# Patient Record
Sex: Female | Born: 1940 | Race: White | Hispanic: No | Smoking: Never smoker
Health system: Southern US, Community
[De-identification: ages and names within clinical notes are randomized; demographics above are authoritative.]

## PROBLEM LIST (undated history)

## (undated) DIAGNOSIS — R112 Nausea with vomiting, unspecified: Secondary | ICD-10-CM

## (undated) DIAGNOSIS — E785 Hyperlipidemia, unspecified: Secondary | ICD-10-CM

## (undated) DIAGNOSIS — K449 Diaphragmatic hernia without obstruction or gangrene: Secondary | ICD-10-CM

## (undated) DIAGNOSIS — I341 Nonrheumatic mitral (valve) prolapse: Secondary | ICD-10-CM

## (undated) DIAGNOSIS — R011 Cardiac murmur, unspecified: Secondary | ICD-10-CM

## (undated) DIAGNOSIS — K5792 Diverticulitis of intestine, part unspecified, without perforation or abscess without bleeding: Secondary | ICD-10-CM

## (undated) DIAGNOSIS — Z9889 Other specified postprocedural states: Secondary | ICD-10-CM

## (undated) DIAGNOSIS — C801 Malignant (primary) neoplasm, unspecified: Secondary | ICD-10-CM

## (undated) DIAGNOSIS — L03039 Cellulitis of unspecified toe: Secondary | ICD-10-CM

## (undated) HISTORY — PX: BREAST BIOPSY: SHX20

## (undated) HISTORY — DX: Diaphragmatic hernia without obstruction or gangrene: K44.9

## (undated) HISTORY — PX: EYE SURGERY: SHX253

## (undated) HISTORY — DX: Nonrheumatic mitral (valve) prolapse: I34.1

## (undated) HISTORY — DX: Diverticulitis of intestine, part unspecified, without perforation or abscess without bleeding: K57.92

## (undated) HISTORY — DX: Cardiac murmur, unspecified: R01.1

## (undated) HISTORY — DX: Cellulitis of unspecified toe: L03.039

## (undated) HISTORY — DX: Hyperlipidemia, unspecified: E78.5

---

## 2001-03-19 ENCOUNTER — Other Ambulatory Visit: Admission: RE | Admit: 2001-03-19 | Discharge: 2001-03-19 | Payer: Self-pay | Admitting: *Deleted

## 2003-04-16 ENCOUNTER — Other Ambulatory Visit: Admission: RE | Admit: 2003-04-16 | Discharge: 2003-04-16 | Payer: Self-pay | Admitting: Family Medicine

## 2003-05-06 HISTORY — PX: COLONOSCOPY: SHX174

## 2003-05-21 ENCOUNTER — Ambulatory Visit (HOSPITAL_COMMUNITY): Admission: RE | Admit: 2003-05-21 | Discharge: 2003-05-21 | Payer: Self-pay | Admitting: Gastroenterology

## 2004-06-16 ENCOUNTER — Other Ambulatory Visit: Admission: RE | Admit: 2004-06-16 | Discharge: 2004-06-16 | Payer: Self-pay | Admitting: Family Medicine

## 2010-12-21 ENCOUNTER — Other Ambulatory Visit: Payer: Self-pay | Admitting: Internal Medicine

## 2010-12-21 ENCOUNTER — Ambulatory Visit
Admission: RE | Admit: 2010-12-21 | Discharge: 2010-12-21 | Disposition: A | Discharge status: Home / Self Care | Payer: Medicare Other | Source: Ambulatory Visit | Attending: Internal Medicine | Admitting: Internal Medicine

## 2010-12-21 DIAGNOSIS — M7989 Other specified soft tissue disorders: Secondary | ICD-10-CM

## 2010-12-21 DIAGNOSIS — M79674 Pain in right toe(s): Secondary | ICD-10-CM

## 2011-01-13 ENCOUNTER — Emergency Department (HOSPITAL_COMMUNITY)
Admission: EM | Admit: 2011-01-13 | Discharge: 2011-01-13 | Disposition: A | Discharge status: Home / Self Care | Payer: Medicare Other | Attending: Emergency Medicine | Admitting: Emergency Medicine

## 2011-01-13 ENCOUNTER — Emergency Department (HOSPITAL_COMMUNITY): Payer: Medicare Other

## 2011-01-13 DIAGNOSIS — L03039 Cellulitis of unspecified toe: Secondary | ICD-10-CM | POA: Insufficient documentation

## 2011-01-13 DIAGNOSIS — M869 Osteomyelitis, unspecified: Secondary | ICD-10-CM | POA: Insufficient documentation

## 2011-01-13 DIAGNOSIS — M79609 Pain in unspecified limb: Secondary | ICD-10-CM | POA: Insufficient documentation

## 2011-01-13 DIAGNOSIS — L02619 Cutaneous abscess of unspecified foot: Secondary | ICD-10-CM | POA: Insufficient documentation

## 2011-01-13 DIAGNOSIS — M7989 Other specified soft tissue disorders: Secondary | ICD-10-CM | POA: Insufficient documentation

## 2011-01-13 LAB — BASIC METABOLIC PANEL
BUN: 16 mg/dL (ref 6–23)
Chloride: 102 mEq/L (ref 96–112)
Creatinine, Ser: 0.57 mg/dL (ref 0.50–1.10)
GFR calc Af Amer: >90 mL/min (ref >90)
GFR calc non Af Amer: >90 mL/min (ref >90)

## 2011-01-13 LAB — CBC
HCT: 36.1 % (ref 36.0–46.0)
MCHC: 32.7 g/dL (ref 30.0–36.0)
MCV: 91.4 fL (ref 78.0–100.0)
Platelets: 254 10*3/uL (ref 150–400)
RDW: 13.2 % (ref 11.5–15.5)
WBC: 6 10*3/uL (ref 4.0–10.5)

## 2011-01-13 LAB — DIFFERENTIAL
Basophils Relative: 1 % (ref 0–1)
Lymphs Abs: 1.7 10*3/uL (ref 0.7–4.0)
Monocytes Relative: 9 % (ref 3–12)
Neutro Abs: 3.5 10*3/uL (ref 1.7–7.7)
Neutrophils Relative %: 59 % (ref 43–77)

## 2011-01-18 ENCOUNTER — Other Ambulatory Visit: Payer: Self-pay | Admitting: Podiatry

## 2011-01-18 DIAGNOSIS — R52 Pain, unspecified: Secondary | ICD-10-CM

## 2011-01-19 ENCOUNTER — Other Ambulatory Visit: Payer: Medicare Other

## 2011-01-27 ENCOUNTER — Ambulatory Visit: Payer: Medicare Other | Admitting: Internal Medicine

## 2011-01-28 ENCOUNTER — Other Ambulatory Visit: Payer: Self-pay | Admitting: Podiatry

## 2011-01-28 ENCOUNTER — Ambulatory Visit
Admission: RE | Admit: 2011-01-28 | Discharge: 2011-01-28 | Disposition: A | Discharge status: Home / Self Care | Payer: Medicare Other | Source: Ambulatory Visit | Attending: Podiatry | Admitting: Podiatry

## 2011-01-28 DIAGNOSIS — R52 Pain, unspecified: Secondary | ICD-10-CM

## 2011-01-28 MED ORDER — GADOBENATE DIMEGLUMINE 529 MG/ML IV SOLN
13.0000 mL | Freq: Once | INTRAVENOUS | Status: AC | PRN
  Administered 2011-01-28: 13 mL via INTRAVENOUS

## 2011-02-07 ENCOUNTER — Other Ambulatory Visit: Payer: Self-pay | Admitting: Internal Medicine

## 2011-02-07 ENCOUNTER — Encounter: Payer: Self-pay | Admitting: Internal Medicine

## 2011-02-07 ENCOUNTER — Ambulatory Visit (INDEPENDENT_AMBULATORY_CARE_PROVIDER_SITE_OTHER): Payer: Medicare Other | Admitting: Internal Medicine

## 2011-02-07 VITALS — BP 164/95 | HR 97 | Temp 97.7°F | Ht 65.0 in | Wt 114.0 lb

## 2011-02-07 DIAGNOSIS — M869 Osteomyelitis, unspecified: Secondary | ICD-10-CM

## 2011-02-07 MED ORDER — CIPROFLOXACIN HCL 750 MG PO TABS: 750.0000 mg | ORAL_TABLET | Freq: Two times a day (BID) | ORAL | Status: AC

## 2011-02-07 MED ORDER — CIPROFLOXACIN HCL 500 MG PO TABS: 500.0000 mg | ORAL_TABLET | Freq: Two times a day (BID) | ORAL | Status: DC

## 2011-02-07 NOTE — Progress Notes (Signed)
  Subjective:    Patient ID: Adriana [name], female    DOB: 09-03-1940, 70 y.o.   MRN: [id number]  HPI this patient is a new patient sent in by her podiatrist secondary to a recurrent bout of cellulitis or. Over the last several months she has been on multiple courses of antibiotics to 2 a third toe of her right foot infection that started as a splinter. When on antibiotics her toe doesn't improve her overall antibiotics it recurs again. Initially several x-rays of her toe were not significant for osteomyelitis however an MRI done last week did show a thin area concerning for osteomyelitis and. She otherwise has no complaints including no fever or chills. She's been tolerating her medications well and has had no significant diarrhea, stomach upset or rashes. She otherwise been in good health. She is active and continues to be active despite this infection.    Review of Systems  Constitutional: Negative for fever, chills and activity change.  Respiratory: Negative for shortness of breath.   Cardiovascular: Negative for leg swelling.  Gastrointestinal: Negative for nausea and diarrhea.  Musculoskeletal: Negative for myalgias and arthralgias.  Skin: Negative for rash.       Objective:   Physical Exam  Constitutional: She is oriented to person, place, and time. She appears well-developed and well-nourished.  Cardiovascular: Normal rate, regular rhythm and normal heart sounds.   No murmur heard. Pulmonary/Chest: Effort normal and breath sounds normal. She has no wheezes.  Abdominal: Soft. Bowel sounds are normal. There is no tenderness.  Neurological: She is alert and oriented to person, place, and time.  Skin: Skin is warm and dry. No rash noted.  Psychiatric: She has a normal mood and affect. Her behavior is normal.          Assessment & Plan:

## 2011-02-07 NOTE — Patient Instructions (Signed)
Use cipro prescription

## 2011-02-07 NOTE — Assessment & Plan Note (Addendum)
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 She has been on ciprofloxacin however no bone biopsy has been done. She will therefore needed  Therapy will start her on vancomycin to cover staph and strep as well as ciprofloxacin at the dose for osteomyelitis to cover potential gram-negative organisms since the patient does have notable onychomycosis. She will continue this for 6 weeks time and at that time she can clinically responds and continues to do well no further actually needed. If it does return now she will need to discuss with the podiatrist further definitive treatment.she was sent for PICC line placement and will start vancomycin per home health protocol

## 2011-02-10 ENCOUNTER — Other Ambulatory Visit: Payer: Self-pay | Admitting: Internal Medicine

## 2011-02-10 ENCOUNTER — Ambulatory Visit (HOSPITAL_COMMUNITY)
Admission: RE | Admit: 2011-02-10 | Discharge: 2011-02-10 | Disposition: A | Discharge status: Home / Self Care | Payer: Medicare Other | Source: Ambulatory Visit | Attending: Internal Medicine | Admitting: Internal Medicine

## 2011-02-10 VITALS — HR 81 | Resp 10

## 2011-02-10 DIAGNOSIS — M869 Osteomyelitis, unspecified: Secondary | ICD-10-CM

## 2011-02-10 MED ORDER — VANCOMYCIN HCL IN DEXTROSE 1-5 GM/200ML-% IV SOLN
1000.0000 mg | INTRAVENOUS | Status: AC
  Administered 2011-02-10: 1000 mg via INTRAVENOUS
  Filled 2011-02-10: qty 200

## 2011-02-10 NOTE — Progress Notes (Signed)
Lorri Frederick from IR spoke to [facility] in Infectious Disease regarding Home Health consult for pt.  Per Annice Pih, consult has been made and [facility] will contact pt.

## 2011-02-10 NOTE — Procedures (Signed)
Procedure:  PICC line Access:  Right brachial vein 42 cm single lumen PICC to cavoatrial junction. OK to use.

## 2011-02-10 NOTE — Progress Notes (Signed)
ANTIBIOTIC CONSULT NOTE - INITIAL  Pharmacy Consult for Vancomycin Indication: cellulitis  No Known Allergies  Patient Measurements:  Total Body Weight = 114 lb Height 5 ft and 5 in  Vital Signs: Pulse Rate: 81  (11/08 1300)  Labs: No labs in last year: Estimated CrCl 58 ml/min  Microbiology: No results found for this or any previous visit (from the past 720 hour(s)).  Medical History: Past Medical History  Diagnosis Date  . Cellulitis, toe   . Hyperlipidemia   . GERD (gastroesophageal reflux disease)   . Osteoporosis   . Diverticulitis   . Mitral valve prolapse   . Osteomyelitis of ankle and foot   . Cholelithiasis     Medications:  Home Medications: Ciprofloxacin and Estradiol   Assessment: 70 yo F with persistent foot cellulitis to receive ongoing Vancomycin via PICC line just placed in interventional radiology.  Goal of Therapy:  Vancomycin trough level 15-20 mcg/ml  Plan:  1) Vancomycin 1 g IV q24h 2) Follow up SCr, UOP, cultures, clinical course, serum trough concentration and adjust as clinically indicated: Specifically if therapy planned >7d would check SCr and Vancomycin trough in 3 days unless otherwise clinically indicated.  Amberleigh Gerken Christine Virginia Crews 02/10/2011,2:59 PM

## 2011-02-14 ENCOUNTER — Telehealth: Payer: Self-pay | Admitting: Licensed Clinical Social Worker

## 2011-02-14 NOTE — Telephone Encounter (Signed)
Nurse from [facility] called stating that the patient missed her Saturday and Sunday dose of Vancomycin and the nurse checked Vanc trough levels today but results are not back yet. Per Dr. Luciana Axe the patient's levels should be rechecked on Thursday to see if they are up to therapeutic level. Nurse advised to call back if any problems.

## 2011-03-14 ENCOUNTER — Telehealth: Payer: Self-pay | Admitting: *Deleted

## 2011-03-14 NOTE — Telephone Encounter (Signed)
Kat with [facility] called [phone] about pulling the PICC. Per radiology notes, the PICC was placed 02/10/11 & per md notes, she is to have the meds for 6 weeks. This brings [place] to 03/24/11 which is the date of her next appt here. Told the rn to keep going until then & we can pull it in the office

## 2011-03-21 ENCOUNTER — Telehealth: Payer: Self-pay | Admitting: *Deleted

## 2011-03-21 NOTE — Telephone Encounter (Signed)
Pt called concerned that she did not have enough doses of vancomycin.  RN called [facility] pharmacy.  [facility] pharmacy made an error and will send out this evenings dose and enough medication to get her to her OV with Dr. Luciana Axe 03/24/11.  Returned call to the pt to let her know to give her AM dose and [facility] will send out the remaining doses.  Pt verbalized understanding.

## 2011-03-24 ENCOUNTER — Ambulatory Visit (INDEPENDENT_AMBULATORY_CARE_PROVIDER_SITE_OTHER): Payer: Medicare Other | Admitting: Internal Medicine

## 2011-03-24 ENCOUNTER — Encounter: Payer: Self-pay | Admitting: Internal Medicine

## 2011-03-24 VITALS — BP 164/88 | HR 88 | Temp 97.6°F | Wt 115.5 lb

## 2011-03-24 DIAGNOSIS — M869 Osteomyelitis, unspecified: Secondary | ICD-10-CM

## 2011-03-24 NOTE — Progress Notes (Signed)
Order to remove PICC line obtained from Dr. Luciana Axe. Pt. identified with name and date of birth. PICC dressing removed, site with small amount of yellow exudate, erythematous, vesicular lesions and several 1mm open areas which appear to be open vesicles.  Area cleaned with Chloroscrub wipes.  Sutures removed. PICC line removed using sterile procedure @ 1615. PICC length equal to that noted in pt's hospital chart of 42 cm. Sterile petroleum gauze + sterile 4X4 applied to PICC site, pressure applied for 10 minutes and covered with Medipore tape as a pressure dressing. Pt. instructed to limit use of arm for 1 hour. Pt. instructed that the pressure dressing should remain in place for 24 hours. Pt instructed to cleanse area gently with mild soap and water.  Apply antibiotic ointment to open areas and allow to remain open to the air.  Pt. verablized understanding of these instructions.

## 2011-03-25 ENCOUNTER — Encounter: Payer: Self-pay | Admitting: Internal Medicine

## 2011-03-25 NOTE — Progress Notes (Signed)
  Subjective:    Patient ID: Adriana [name], female    DOB: [birth year], 70 y.o.   MRN: [id number]  HPI she returns at the end of 6 weeks of IV therapy for 3rd toe osteomyelitis.  She was treated empirically with po cipro and IV vancomycin.  No biopsy was ever done.  She tolerated well.  No fever, chills.  Pain is improved.  ESR and CRP are near normal.      Review of Systems  Constitutional: Negative for fever, chills, activity change and fatigue.  Gastrointestinal: Negative for diarrhea.  Musculoskeletal: Negative for myalgias and arthralgias.  Skin: Negative for rash and wound.  Hematological: Negative for adenopathy.       Objective:   Physical Exam  Constitutional: She appears well-developed and well-nourished. No distress.  Cardiovascular: Normal rate, regular rhythm and normal heart sounds.  Exam reveals no gallop and no friction rub.   No murmur heard. Musculoskeletal:       Toe with onchomycosis and some erythema, edema, but not warm or tender.  Overall improved.    Skin:       PICC area with erythema, mild discharge c/w local infection.            Assessment & Plan:

## 2011-03-25 NOTE — Assessment & Plan Note (Addendum)
Improved and inflammatory markers are normal.  She tolerated it well.  I will have her PICC removed and she will follow up with podiatry for further management regarding nail and fungus.  Return PRN.   No systemic signs of infection, PICC likely mild reaction and treatment will be to remove PICC.

## 2011-03-28 ENCOUNTER — Encounter: Payer: Self-pay | Admitting: *Deleted

## 2013-01-02 ENCOUNTER — Ambulatory Visit: Payer: Self-pay

## 2013-01-08 ENCOUNTER — Ambulatory Visit (INDEPENDENT_AMBULATORY_CARE_PROVIDER_SITE_OTHER): Payer: Medicare HMO

## 2013-01-08 DIAGNOSIS — Z23 Encounter for immunization: Secondary | ICD-10-CM

## 2013-02-04 ENCOUNTER — Encounter: Payer: Medicare HMO | Admitting: Internal Medicine

## 2013-02-08 ENCOUNTER — Ambulatory Visit (INDEPENDENT_AMBULATORY_CARE_PROVIDER_SITE_OTHER): Payer: Medicare HMO | Admitting: Internal Medicine

## 2013-02-08 ENCOUNTER — Encounter: Payer: Self-pay | Admitting: Internal Medicine

## 2013-02-08 VITALS — BP 138/76 | HR 82 | Temp 96.9°F | Resp 14 | Ht 65.0 in | Wt 113.0 lb

## 2013-02-08 DIAGNOSIS — M810 Age-related osteoporosis without current pathological fracture: Secondary | ICD-10-CM

## 2013-02-08 DIAGNOSIS — Z13220 Encounter for screening for lipoid disorders: Secondary | ICD-10-CM

## 2013-02-08 DIAGNOSIS — L659 Nonscarring hair loss, unspecified: Secondary | ICD-10-CM

## 2013-02-08 DIAGNOSIS — Z1211 Encounter for screening for malignant neoplasm of colon: Secondary | ICD-10-CM

## 2013-02-08 DIAGNOSIS — Z23 Encounter for immunization: Secondary | ICD-10-CM

## 2013-02-08 DIAGNOSIS — Z0000 Encounter for general adult medical examination without abnormal findings: Secondary | ICD-10-CM

## 2013-02-08 MED ORDER — TETANUS-DIPHTH-ACELL PERTUSSIS 5-2.5-18.5 LF-MCG/0.5 IM SUSP: 0.5000 mL | Freq: Once | INTRAMUSCULAR | Status: DC

## 2013-02-08 NOTE — Progress Notes (Signed)
Patient ID: Adriana [name], female   DOB: 07/04/1940, 72 y.o.   MRN: [id number] Location:  [facility]  No Known Allergies  Chief Complaint  Patient presents with  . Annual Exam    Yearly exam with fasting labs     HPI: Patient is a 72 y.o. white female seen in the office today for annual exam.  No new concerns.  Just felt she needed a check up.    3rd toe is still painful on occasion.    05/2011--had routine screening mammogram Has also had bone density last visit, but I do not have this.  Took forteo for a year and a half many years ago.  Has not taken anything since.  Did have osteoporosis at that time.  By end of visit, received bone density result from 4/13:  Osteoporosis with T score -3.8.  Also previously took fosamax in the past.  She eats a diet high in ca and vit D and does weightbearing exercise, but is not taking any vitamin D supplements right now.  Review of Systems:  Review of Systems  Constitutional: Negative for fever, chills, weight loss and malaise/fatigue.  HENT: Negative for congestion.   Eyes:       Has cataracts bilaterally--follows with Dr. Dagoberto Ligas  Respiratory: Negative for cough and shortness of breath.   Cardiovascular: Negative for chest pain and leg swelling.  Gastrointestinal: Negative for abdominal pain.       Has hemorrhoids longstanding  Musculoskeletal: Positive for myalgias. Negative for falls.       Pain in right third toe persists off and on;  Myalgias after exercise  Skin: Negative for rash.       C/o alopecia  Neurological: Negative for dizziness, focal weakness, weakness and headaches.  Endo/Heme/Allergies: Does not bruise/bleed easily.  Psychiatric/Behavioral: Negative for depression and memory loss.    Past Medical History  Diagnosis Date  . Cellulitis, toe   . Hyperlipidemia   . GERD (gastroesophageal reflux disease)   . Osteoporosis   . Diverticulitis   . Mitral valve prolapse   .  Osteomyelitis of ankle and foot   . Cholelithiasis     Past Surgical History  Procedure Laterality Date  . Colonoscopy  05/2003    No polyps, hemorrhoids,and diverticulosis   . Breast biopsy      Left,negative     Social History:   reports that she has never smoked. She has never used smokeless tobacco. She reports that she drinks alcohol. She reports that she does not use illicit drugs.  Family History  Problem Relation Age of Onset  . Pneumonia Father 46  . Cerebrovascular Accident Mother 48  . Breast cancer Sister 52    Medications: Patient's Medications  New Prescriptions   No medications on file  Previous Medications   TDAP (BOOSTRIX) 5-2.5-18.5 LF-MCG/0.5 INJECTION    Inject 0.5 mLs into the muscle once.  Modified Medications   No medications on file  Discontinued Medications   CIPROFLOXACIN (CIPRO) 750 MG TABLET    Take 750 mg by mouth 2 (two) times daily.     ESTRADIOL (VAGIFEM) 10 MCG TABS    Place 10 mcg vaginally 2 (two) times a week.       Physical Exam: Filed Vitals:   02/08/13 0840  BP: 138/76  Pulse: 82  Temp: 96.9 F (36.1 C)  TempSrc: Oral  Resp: 14  Height: 5\' 5"  (1.651 m)  Weight: 113 lb (51.256 kg)  SpO2: 92%  Physical Exam  Constitutional: She is oriented to person, place, and time. She appears well-developed and well-nourished. No distress.  HENT:  Head: Normocephalic and atraumatic.  Right Ear: External ear normal.  Left Ear: External ear normal.  Nose: Nose normal.  Mouth/Throat: Oropharynx is clear and moist. No oropharyngeal exudate.  Right ear with some irritation of canal  Eyes: Conjunctivae and EOM are normal. Pupils are equal, round, and reactive to light. No scleral icterus.  Neck: Normal range of motion. Neck supple. No JVD present. No tracheal deviation present. No thyromegaly present.  Cardiovascular: Normal rate, regular rhythm and intact distal pulses.   Midsystolic click  Pulmonary/Chest: Effort normal and breath sounds  normal. No respiratory distress. Right breast exhibits no mass, no nipple discharge, no skin change and no tenderness. Left breast exhibits no mass, no nipple discharge, no skin change and no tenderness.  Abdominal: Soft. Bowel sounds are normal. She exhibits no distension and no mass. There is no tenderness. No hernia.  Musculoskeletal: Normal range of motion. She exhibits no edema and no tenderness.  Mild chronic swelling of third toe  Lymphadenopathy:    She has no cervical adenopathy.  Neurological: She is alert and oriented to person, place, and time. She has normal reflexes. A cranial nerve deficit is present.  Skin: Skin is warm and dry. No rash noted.  Psychiatric: She has a normal mood and affect. Her behavior is normal. Judgment and thought content normal.   Labs reviewed: 06/13/11:  Wbc 6.7, hgb 12.7, tc 166, TG 86, HDL 68, LDL 81, TSH 1.540, vit D 39.5  Past Procedures: Bone density 4/13 Mammogram 2/13 Cscope 05/2003  Assessment/Plan 1. Routine general medical examination at a health care facility -had annual physical today -no longer requires pap/pelvic -due for colonoscopy and does want to get so referral done--due 2/15 -had mammogram 2013 and bone density 2013 -had flu shot, pneumonia shot, script given for tdap,had shingles vaccine also 2011  2. Senile osteoporosis -DEXA 2013 revealed worsened osteoporosis since stopping all her treatments in 2009 -resume taking vitamin D 2000 units daily -check Vitamin D, 25-hydroxy level  3. Need for lipid screening - check Comprehensive metabolic panel for transaminases - check Lipid panel next draw  4. Hair loss -likely due to aging -check TSH (was normal 1.5 years ago)  5. Encounter for screening colonoscopy - Ambulatory referral to Gastroenterology for 05/2013 routine screening cscope  6. Need for Tdap vaccination - TDaP (BOOSTRIX) 5-2.5-18.5 LF-MCG/0.5 injection; Inject 0.5 mLs into the muscle once.  Dispense: 0.5 mL;  Refill: 0 prescription provided  Labs/tests ordered:  Cbc, cmp, flp, tsh, vit D Next appt:  1 year

## 2013-02-09 LAB — CBC WITH DIFFERENTIAL/PLATELET
Basophils Absolute: 0 10*3/uL (ref 0.0–0.2)
Basos: 1 %
Eos: 5 %
Eosinophils Absolute: 0.3 10*3/uL (ref 0.0–0.4)
HCT: 39.9 % (ref 34.0–46.6)
Hemoglobin: 13.4 g/dL (ref 11.1–15.9)
Immature Grans (Abs): 0 10*3/uL (ref 0.0–0.1)
Immature Granulocytes: 0 %
Lymphocytes Absolute: 1.5 10*3/uL (ref 0.7–3.1)
Lymphs: 24 %
MCH: 30.9 pg (ref 26.6–33.0)
MCHC: 33.6 g/dL (ref 31.5–35.7)
MCV: 92 fL (ref 79–97)
Monocytes Absolute: 0.5 10*3/uL (ref 0.1–0.9)
Monocytes: 7 %
Neutrophils Absolute: 3.9 10*3/uL (ref 1.4–7.0)
Neutrophils Relative %: 63 %
RBC: 4.34 x10E6/uL (ref 3.77–5.28)
RDW: 13.7 % (ref 12.3–15.4)
WBC: 6.2 10*3/uL (ref 3.4–10.8)

## 2013-02-09 LAB — COMPREHENSIVE METABOLIC PANEL
ALT: 11 IU/L (ref 0–32)
AST: 17 IU/L (ref 0–40)
Albumin/Globulin Ratio: 1.8 (ref 1.1–2.5)
Albumin: 4.4 g/dL (ref 3.5–4.8)
Alkaline Phosphatase: 96 IU/L (ref 39–117)
BUN/Creatinine Ratio: 14 (ref 11–26)
BUN: 11 mg/dL (ref 8–27)
CO2: 25 mmol/L (ref 18–29)
Calcium: 9.8 mg/dL (ref 8.6–10.2)
Chloride: 102 mmol/L (ref 97–108)
Creatinine, Ser: 0.76 mg/dL (ref 0.57–1.00)
GFR calc Af Amer: 90 mL/min/{1.73_m2} (ref >59)
GFR calc non Af Amer: 78 mL/min/{1.73_m2} (ref >59)
Globulin, Total: 2.4 g/dL (ref 1.5–4.5)
Glucose: 84 mg/dL (ref 65–99)
Potassium: 5.2 mmol/L (ref 3.5–5.2)
Sodium: 143 mmol/L (ref 134–144)
Total Bilirubin: 0.5 mg/dL (ref 0.0–1.2)
Total Protein: 6.8 g/dL (ref 6.0–8.5)

## 2013-02-09 LAB — LIPID PANEL
Chol/HDL Ratio: 2.6 ratio units (ref 0.0–4.4)
Cholesterol, Total: 187 mg/dL (ref 100–199)
HDL: 71 mg/dL (ref >39)
LDL Calculated: 97 mg/dL (ref 0–99)
Triglycerides: 93 mg/dL (ref 0–149)
VLDL Cholesterol Cal: 19 mg/dL (ref 5–40)

## 2013-02-09 LAB — TSH: TSH: 1.41 u[IU]/mL (ref 0.450–4.500)

## 2013-02-09 LAB — VITAMIN D 25 HYDROXY (VIT D DEFICIENCY, FRACTURES): Vit D, 25-Hydroxy: 24.2 ng/mL — ABNORMAL LOW (ref 30.0–100.0)

## 2013-02-22 ENCOUNTER — Encounter: Payer: Self-pay | Admitting: Internal Medicine

## 2013-06-20 IMAGING — CR DG TOE 3RD 2+V*R*
3 series · 3 of 3 positions shown · non-contrast
Comparison: None

CLINICAL DATA: Pain and swelling of the third toe.

RIGHT TOE - 2+ VIEW

[t toes ap right]
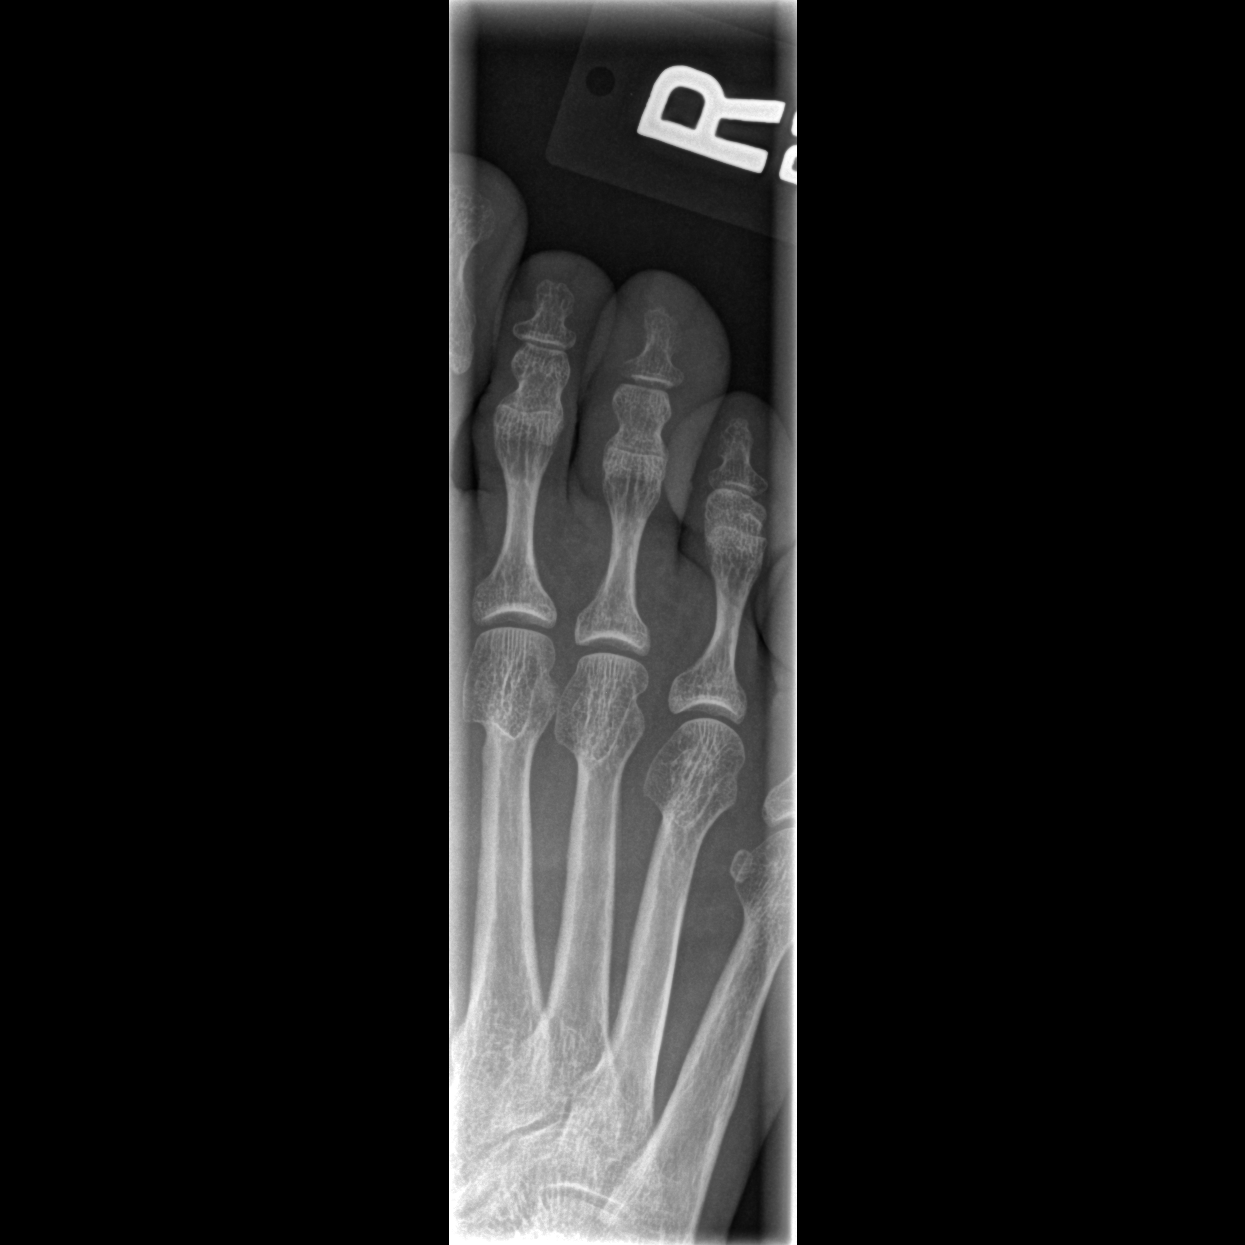

[t toes oblique right]
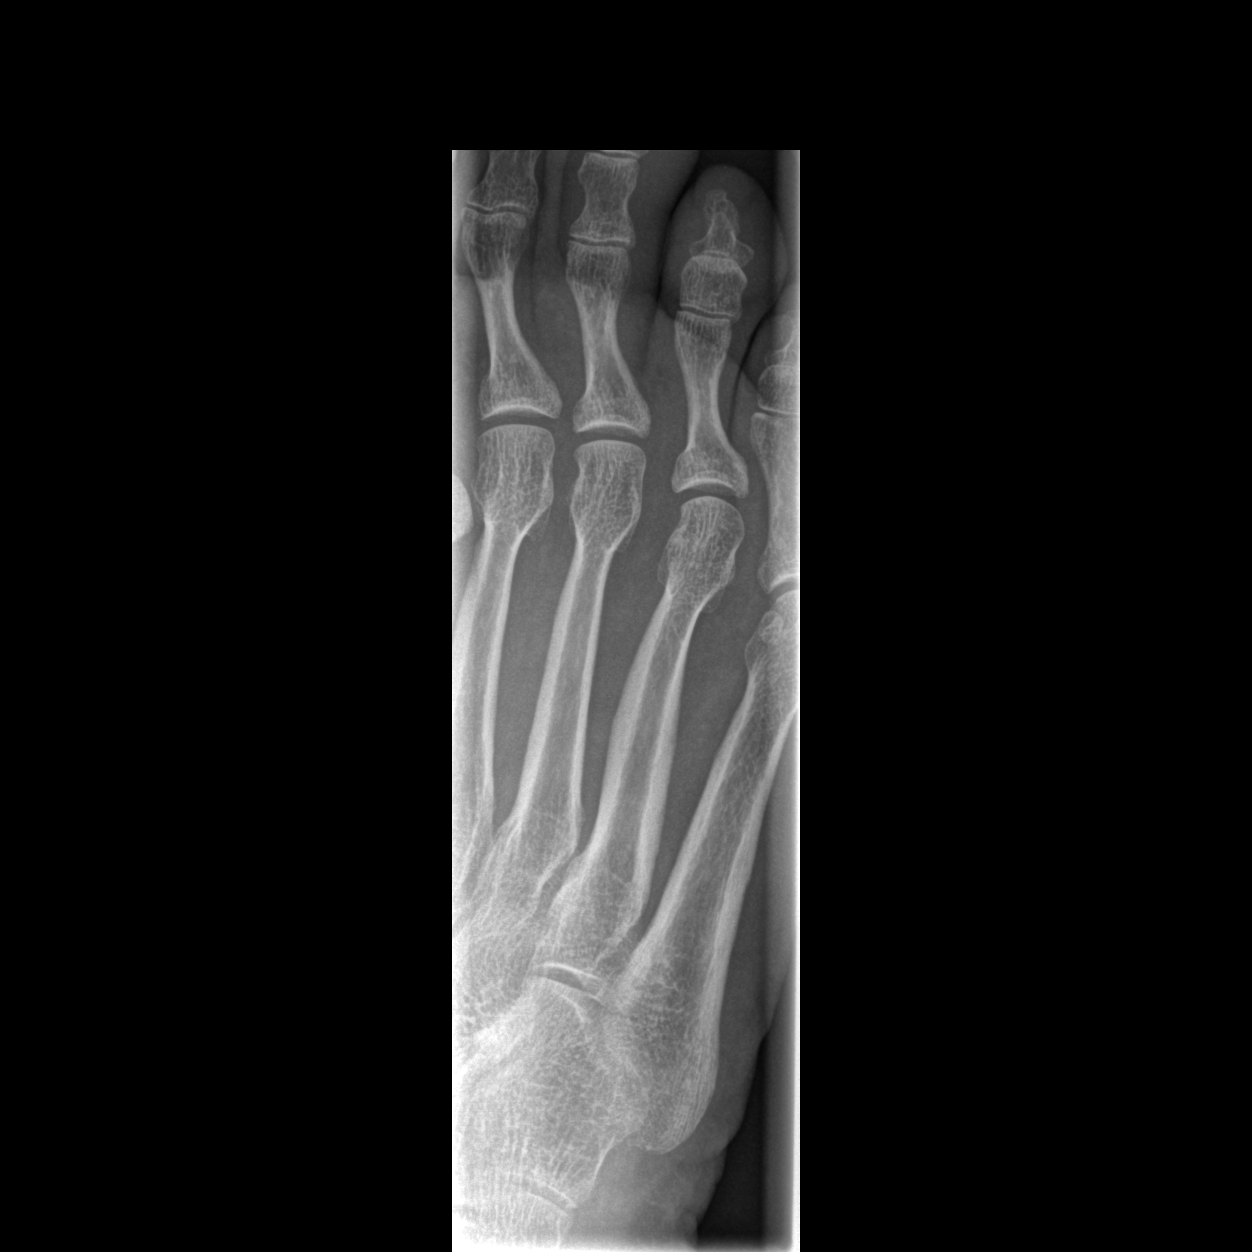

[t toes lateral right]
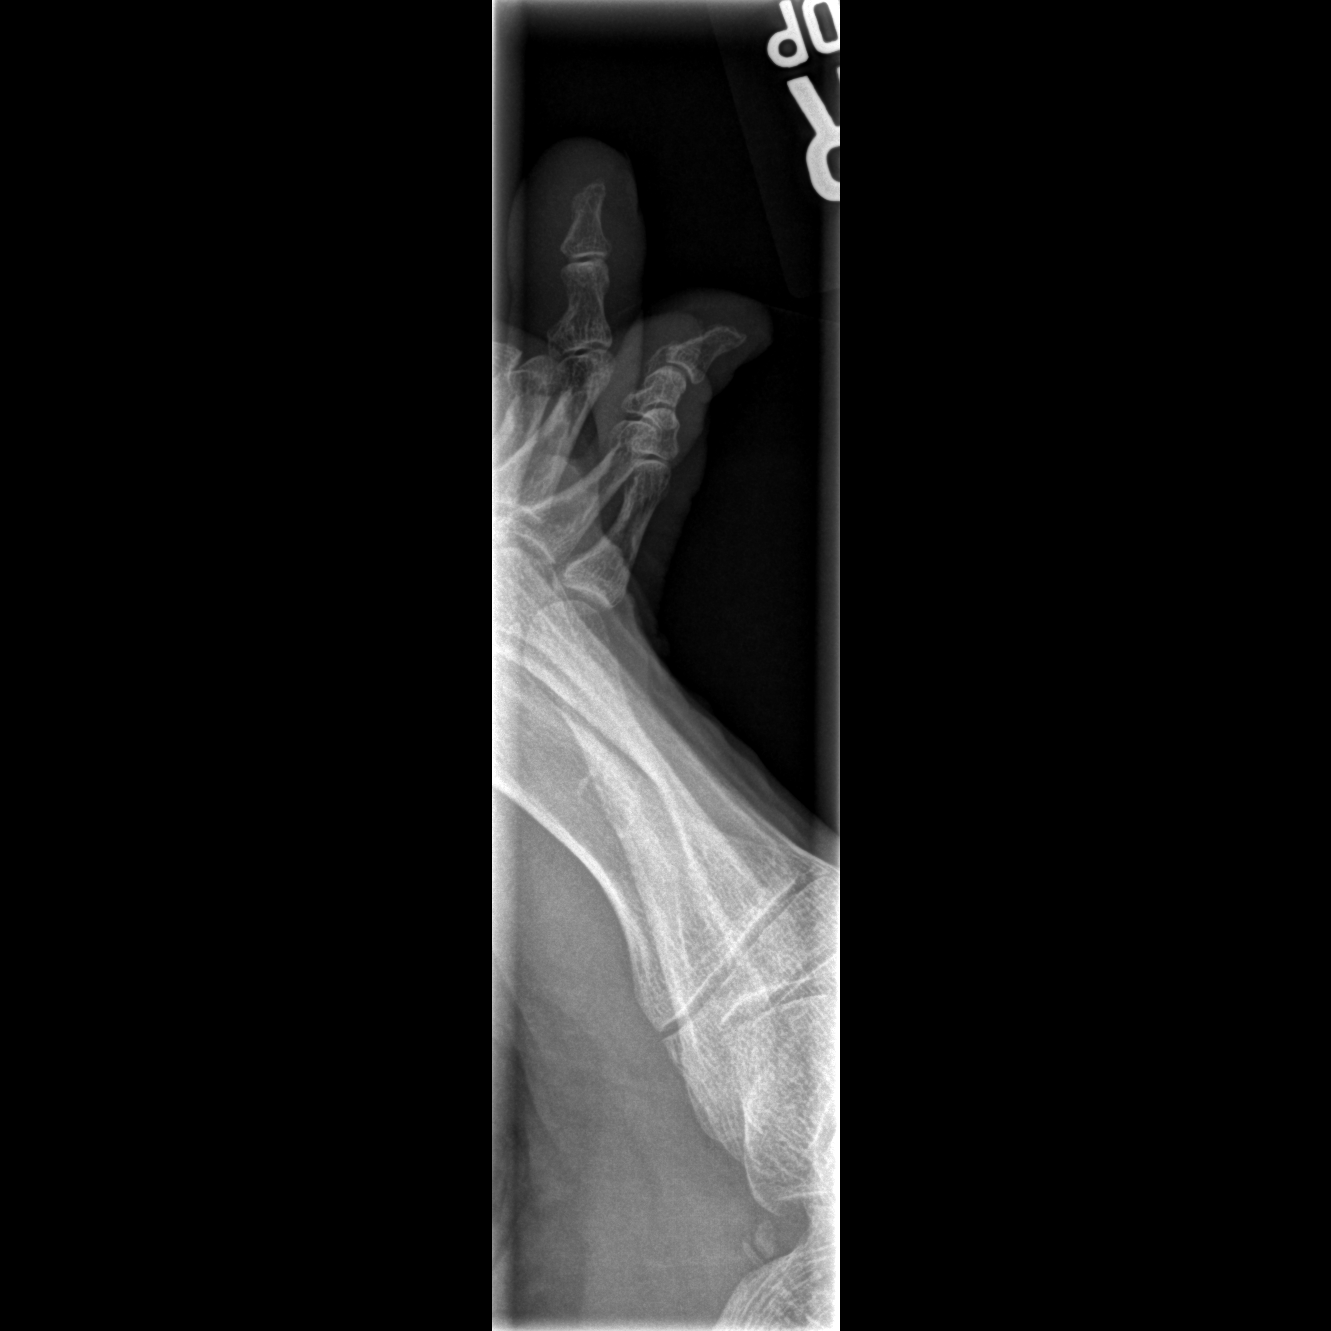

[3 of 3 positions shown; findings below may reference images not displayed]

FINDINGS: The joint spaces are maintained.  No findings to suggest
septic arthritis.  The medial cortex of the distal phalanx appears
disrupted.  Findings suspicious for osteomyelitis.  No other
significant findings.
IMPRESSION: Plain film findings suspicious for osteomyelitis involving the
medial and proximal aspect of the distal phalanx of the third toe.

## 2013-08-10 IMAGING — US IR FLUORO GUIDE CV LINE*R*
1 series · 1 of 1 positions shown · non-contrast
Comparison: none

CLINICAL DATA: Infection of the right third toe with osteomyelitis.
The patient requires a PICC line for IV antibiotic therapy.

[Series 1: ir fluoro guide cv line*right* · 1 of 1 slices shown]
[im 1/1]
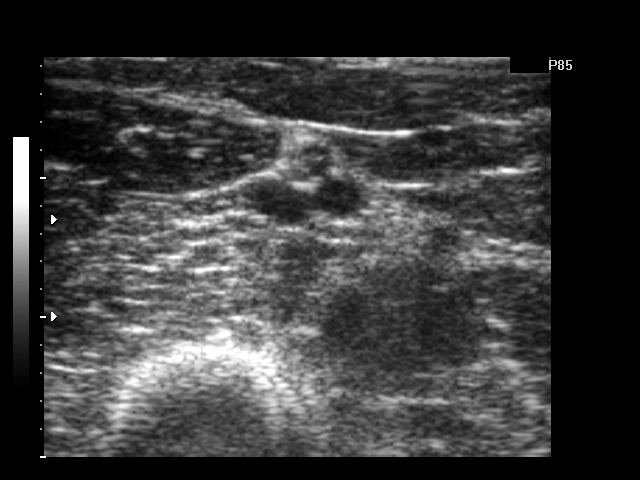

[1 of 1 positions shown; findings below may reference images not displayed]

PICC LINE PLACEMENT WITH ULTRASOUND AND FLUOROSCOPIC  GUIDANCE

Fluoroscopy Time: 0.6 minutes.

The right arm was prepped with chlorhexidine, draped in the usual
sterile fashion using maximum barrier technique (cap and mask,
sterile gown, sterile gloves, large sterile sheet, hand hygiene and
cutaneous antisepsis) and infiltrated locally with 1% Lidocaine.

Ultrasound demonstrated patency of the right brachial vein, and
this was documented with an image.  Under real-time ultrasound
guidance, this vein was accessed with a 21 gauge micropuncture
needle and image documentation was performed.  The needle was
exchanged over a guidewire for a peel-away sheath through which a 5
French single lumen PICC trimmed to 42 cm was advanced, positioned
with its tip at the lower SVC/right atrial junction.  Fluoroscopy
during the procedure and fluoro spot radiograph confirms
appropriate catheter position.  The catheter was flushed, secured
to the skin with Prolene sutures, and covered with a sterile
dressing.

Complications:  None
IMPRESSION: Successful right arm PICC line placement with ultrasound and
fluoroscopic guidance.  The catheter is ready for use.

## 2013-12-27 LAB — HM COLONOSCOPY

## 2014-01-03 ENCOUNTER — Encounter: Payer: Self-pay | Admitting: *Deleted

## 2014-02-13 ENCOUNTER — Encounter: Payer: Medicare HMO | Admitting: Internal Medicine

## 2014-02-14 ENCOUNTER — Encounter: Payer: Self-pay | Admitting: Internal Medicine

## 2014-04-11 DIAGNOSIS — H43813 Vitreous degeneration, bilateral: Secondary | ICD-10-CM | POA: Diagnosis not present

## 2014-04-11 DIAGNOSIS — H2511 Age-related nuclear cataract, right eye: Secondary | ICD-10-CM | POA: Diagnosis not present

## 2014-04-11 DIAGNOSIS — H04123 Dry eye syndrome of bilateral lacrimal glands: Secondary | ICD-10-CM | POA: Diagnosis not present

## 2014-04-11 DIAGNOSIS — H01001 Unspecified blepharitis right upper eyelid: Secondary | ICD-10-CM | POA: Diagnosis not present

## 2014-05-28 ENCOUNTER — Other Ambulatory Visit: Payer: Self-pay | Admitting: *Deleted

## 2014-05-28 DIAGNOSIS — Z0000 Encounter for general adult medical examination without abnormal findings: Secondary | ICD-10-CM

## 2014-06-02 ENCOUNTER — Other Ambulatory Visit: Payer: Medicare HMO

## 2014-06-02 DIAGNOSIS — Z0000 Encounter for general adult medical examination without abnormal findings: Secondary | ICD-10-CM

## 2014-06-03 LAB — TSH: TSH: 2.29 u[IU]/mL (ref 0.450–4.500)

## 2014-06-03 LAB — CBC WITH DIFFERENTIAL/PLATELET
Basophils Absolute: 0.1 10*3/uL (ref 0.0–0.2)
Basos: 1 %
Eos: 6 %
Eosinophils Absolute: 0.3 10*3/uL (ref 0.0–0.4)
HCT: 41.2 % (ref 34.0–46.6)
Hemoglobin: 13.5 g/dL (ref 11.1–15.9)
Immature Grans (Abs): 0 10*3/uL (ref 0.0–0.1)
Immature Granulocytes: 0 %
Lymphocytes Absolute: 1.9 10*3/uL (ref 0.7–3.1)
Lymphs: 32 %
MCH: 30.4 pg (ref 26.6–33.0)
MCHC: 32.8 g/dL (ref 31.5–35.7)
MCV: 93 fL (ref 79–97)
Monocytes Absolute: 0.5 10*3/uL (ref 0.1–0.9)
Monocytes: 8 %
Neutrophils Absolute: 3.2 10*3/uL (ref 1.4–7.0)
Neutrophils Relative %: 53 %
Platelets: 271 10*3/uL (ref 150–379)
RBC: 4.44 x10E6/uL (ref 3.77–5.28)
RDW: 13.6 % (ref 12.3–15.4)
WBC: 5.9 10*3/uL (ref 3.4–10.8)

## 2014-06-03 LAB — COMPREHENSIVE METABOLIC PANEL
ALT: 10 IU/L (ref 0–32)
AST: 14 IU/L (ref 0–40)
Albumin/Globulin Ratio: 2.5 (ref 1.1–2.5)
Albumin: 4.5 g/dL (ref 3.5–4.8)
Alkaline Phosphatase: 93 IU/L (ref 39–117)
BUN/Creatinine Ratio: 19 (ref 11–26)
BUN: 15 mg/dL (ref 8–27)
Bilirubin Total: 0.4 mg/dL (ref 0.0–1.2)
CO2: 26 mmol/L (ref 18–29)
Calcium: 9.4 mg/dL (ref 8.7–10.3)
Chloride: 100 mmol/L (ref 97–108)
Creatinine, Ser: 0.79 mg/dL (ref 0.57–1.00)
GFR calc Af Amer: 85 mL/min/{1.73_m2} (ref >59)
GFR calc non Af Amer: 74 mL/min/{1.73_m2} (ref >59)
Globulin, Total: 1.8 g/dL (ref 1.5–4.5)
Glucose: 89 mg/dL (ref 65–99)
Potassium: 4.4 mmol/L (ref 3.5–5.2)
Sodium: 140 mmol/L (ref 134–144)
Total Protein: 6.3 g/dL (ref 6.0–8.5)

## 2014-06-03 LAB — LIPID PANEL
Chol/HDL Ratio: 2.6 ratio units (ref 0.0–4.4)
Cholesterol, Total: 186 mg/dL (ref 100–199)
HDL: 71 mg/dL (ref >39)
LDL Calculated: 101 mg/dL — ABNORMAL HIGH (ref 0–99)
Triglycerides: 71 mg/dL (ref 0–149)
VLDL Cholesterol Cal: 14 mg/dL (ref 5–40)

## 2014-06-03 LAB — VITAMIN D 25 HYDROXY (VIT D DEFICIENCY, FRACTURES): Vit D, 25-Hydroxy: 30.4 ng/mL (ref 30.0–100.0)

## 2014-06-06 ENCOUNTER — Ambulatory Visit (INDEPENDENT_AMBULATORY_CARE_PROVIDER_SITE_OTHER): Payer: Medicare HMO | Admitting: Internal Medicine

## 2014-06-06 ENCOUNTER — Encounter: Payer: Self-pay | Admitting: Internal Medicine

## 2014-06-06 VITALS — BP 122/78 | HR 94 | Temp 98.1°F | Resp 12 | Ht 65.0 in | Wt 114.0 lb

## 2014-06-06 DIAGNOSIS — K4090 Unilateral inguinal hernia, without obstruction or gangrene, not specified as recurrent: Secondary | ICD-10-CM

## 2014-06-06 DIAGNOSIS — K219 Gastro-esophageal reflux disease without esophagitis: Secondary | ICD-10-CM | POA: Diagnosis not present

## 2014-06-06 DIAGNOSIS — Z23 Encounter for immunization: Secondary | ICD-10-CM

## 2014-06-06 DIAGNOSIS — Z0000 Encounter for general adult medical examination without abnormal findings: Secondary | ICD-10-CM | POA: Diagnosis not present

## 2014-06-06 DIAGNOSIS — M810 Age-related osteoporosis without current pathological fracture: Secondary | ICD-10-CM | POA: Diagnosis not present

## 2014-06-06 DIAGNOSIS — Z789 Other specified health status: Secondary | ICD-10-CM | POA: Diagnosis not present

## 2014-06-06 DIAGNOSIS — E785 Hyperlipidemia, unspecified: Secondary | ICD-10-CM

## 2014-06-06 MED ORDER — VITAMIN D3 50 MCG (2000 UT) PO CAPS: 2000.0000 [IU] | ORAL_CAPSULE | Freq: Every day | ORAL | Status: DC

## 2014-06-06 NOTE — Patient Instructions (Signed)

## 2014-06-06 NOTE — Progress Notes (Signed)
Patient ID: Adriana [name], female   DOB: 1940-06-15, 74 y.o.   MRN: [id number]   Location:  [facility] / [place]  Advanced Directive information Does patient have an advance directive?: Yes, Type of Advance Directive: Healthcare Power of [place];Living will, Does patient want to make changes to advanced directive?: No - Patient declined  No Known Allergies  Chief Complaint  Patient presents with  . Annual Exam    Yearly check-up, discuss labs (copy printed). Raised area in groin x 1 month, no known injury   . Other    Refused Mammogram update    HPI: Patient is a 74 y.o. seen in the office today for her annual exam.  Raised area that is soft in left groin first noted a few wks ago in shower--only sees when standing up.  Not painful.  Occasionally does do some heavy lifting.  Had taken a root out of the ground.    No other concerns.    Continues issues with indigestion that are longstanding with beans, etc.  No longer has a lot of pain anymore since managing her diet and going through a lot of stress at that time when her first husband had cancer and dying.  Has had hiatal hernia in past and no recent issue.  Has historical diagnosis of MVP from about 40 years ago.  Has gallstones, but typically does not flare up (recalls one instance of cheesecake and pizza in one meal).  Toe finally got back to normal size and nonpainful.  Says she will hear from mammogram place and go when due.  Knows she is at high risk.  Was involved in tamoxifen trial many years ago.  Left breast negative biopsy in the past.  Says she will go for the mammo and bone density.  Gets at [facility].  Had flu shot at [facility].  Has not yet had prevnar.  Had pneumovax 06/02/2005.  Review of Systems:  Review of Systems  Constitutional: Negative for fever, chills and malaise/fatigue.  HENT: Negative for congestion and hearing loss.        No difficulty chewing or swallowing  Eyes: Negative  for blurred vision.  Respiratory: Negative for cough and shortness of breath.   Cardiovascular: Negative for chest pain, palpitations and leg swelling.       Has MVP  Gastrointestinal: Positive for heartburn. Negative for nausea, vomiting, abdominal pain, diarrhea, constipation and blood in stool.       Place that is sticking out in right groin on standing  Genitourinary: Negative for dysuria, urgency, frequency and hematuria.  Musculoskeletal: Negative for myalgias and falls.  Skin: Negative for rash.  Neurological: Negative for dizziness, loss of consciousness, weakness and headaches.  Endo/Heme/Allergies: Does not bruise/bleed easily.  Psychiatric/Behavioral: Negative for depression and memory loss.     Past Medical History  Diagnosis Date  . Cellulitis, toe   . Hyperlipidemia   . GERD (gastroesophageal reflux disease)   . Osteoporosis   . Diverticulitis   . Mitral valve prolapse   . Osteomyelitis of ankle and foot   . Cholelithiasis     Past Surgical History  Procedure Laterality Date  . Colonoscopy  05/2003    No polyps, hemorrhoids,and diverticulosis   . Breast biopsy      Left,negative     Social History:   reports that she has never smoked. She has never used smokeless tobacco. She reports that she drinks alcohol. She reports that she does not use illicit  drugs.  Family History  Problem Relation Age of Onset  . Pneumonia Father 5  . Cerebrovascular Accident Mother 55  . Breast cancer Sister 46    Medications: Patient's Medications  New Prescriptions   No medications on file  Previous Medications   No medications on file  Modified Medications   No medications on file  Discontinued Medications   TDAP (BOOSTRIX) 5-2.5-18.5 LF-MCG/0.5 INJECTION    Inject 0.5 mLs into the muscle once.     Physical Exam: Filed Vitals:   06/06/14 0851  BP: 122/78  Pulse: 94  Temp: 98.1 F (36.7 C)  TempSrc: Oral  Resp: 12  Height: 5\' 5"  (1.651 m)  Weight: 114 lb  (51.71 kg)  SpO2: 98%  Physical Exam  Constitutional: She is oriented to person, place, and time. She appears well-developed and well-nourished. No distress.  Thin white female  HENT:  Head: Normocephalic and atraumatic.  Right Ear: External ear normal.  Left Ear: External ear normal.  Nose: Nose normal.  Mouth/Throat: Oropharynx is clear and moist. No oropharyngeal exudate.  Eyes: Conjunctivae and EOM are normal. Pupils are equal, round, and reactive to light.  Neck: Normal range of motion. Neck supple. No JVD present. No thyromegaly present.  Cardiovascular: Normal rate, regular rhythm, normal heart sounds and intact distal pulses.   MVP  Pulmonary/Chest: Effort normal and breath sounds normal. Right breast exhibits no inverted nipple, no mass, no nipple discharge, no skin change and no tenderness. Left breast exhibits no inverted nipple, no mass, no nipple discharge, no skin change and no tenderness.  Abdominal: Soft. Bowel sounds are normal. She exhibits no distension and no mass. There is no tenderness. There is no rebound and no guarding. A hernia is present.  Right groin with reducible hernia only present on standing, soft, nontender, no discoloration  Musculoskeletal: Normal range of motion. She exhibits no edema or tenderness.  Lymphadenopathy:    She has no cervical adenopathy.  Neurological: She is alert and oriented to person, place, and time. She has normal reflexes. No cranial nerve deficit.  Skin: Skin is warm and dry.  Psychiatric: She has a normal mood and affect. Her behavior is normal. Judgment and thought content normal.    Labs reviewed: Basic Metabolic Panel:  Recent Labs  06/02/14 0811  NA 140  K 4.4  CL 100  CO2 26  GLUCOSE 89  BUN 15  CREATININE 0.79  CALCIUM 9.4  TSH 2.290   Liver Function Tests:  Recent Labs  06/02/14 0811  AST 14  ALT 10  ALKPHOS 93  BILITOT 0.4  PROT 6.3  CBC:  Recent Labs  06/02/14 0811  WBC 5.9  NEUTROABS 3.2    HGB 13.5  HCT 41.2  MCV 93  PLT 271   Lipid Panel:  Recent Labs  06/02/14 0811  CHOL 186  HDL 71  LDLCALC 101*  TRIG 71  CHOLHDL 2.6    Assessment/Plan 1. Routine general medical examination at a health care facility -reviewed preventive care:  prevnar given, given pneumovax in 2007, says [facility], had tdap in sept 2015,  Zoster 2011, will get her mammogram and bone density done at [facility] as per my recommendation, no longer getting paps, had cscope 12/27/13, no falls, no depression on PHQ9  2. Hyperlipidemia - LDL is just above 100, but HDL is excellent, cont healthy vegan diet and exercise program - Comprehensive metabolic panel; Future - Lipid panel; Future  3. Gastroesophageal reflux  disease without esophagitis -bothers her on occasion, but not enough to require prescription medications  4. Senile osteoporosis - advised to get her f/u bone density done so we can determine if she should take prolia for her osteoporosis - Cholecalciferol (VITAMIN D3) 2000 UNITS capsule; Take 1 capsule (2,000 Units total) by mouth daily.  Dispense: 30 capsule; Refill: 3 - Vitamin D, 25-hydroxy; Future  5. Right groin hernia -is nontender, reducible at present without discoloration -instructions provided and advised not to lift more than 20 lbs--is very active, also not to use exclusively her core to lift, but use her legs also  6. Need for vaccination with 13-polyvalent pneumococcal conjugate vaccine - Pneumococcal conjugate vaccine 13-valent  7. Consumes a vegan diet -take vitamin D supplement consistently 2000 units daily b/c level was only 30 - CBC with Differential/Platelet; Future - B12 and Folate Panel; Future - Vitamin D, 25-hydroxy; Future  Labs/tests ordered:   Orders Placed This Encounter  Procedures  . Pneumococcal conjugate vaccine 13-valent  . CBC with Differential/Platelet    Standing Status: Future     Number of Occurrences:      Standing  Expiration Date: 12/07/2015  . Comprehensive metabolic panel    Standing Status: Future     Number of Occurrences:      Standing Expiration Date: 12/07/2015    Order Specific Question:  Has the patient fasted?    Answer:  Yes  . Lipid panel    Standing Status: Future     Number of Occurrences:      Standing Expiration Date: 12/07/2015    Order Specific Question:  Has the patient fasted?    Answer:  Yes  . B12 and Folate Panel    Standing Status: Future     Number of Occurrences:      Standing Expiration Date: 12/07/2015  . Vitamin D, 25-hydroxy    Standing Status: Future     Number of Occurrences:      Standing Expiration Date: 12/07/2015    Next appt:  1 year for annual exam with labs before  Tamyrah Burbage L. Taliah Porche, D.O. [facility] [address], [place] [address] Cell Phone (Mon-Fri 8am-5pm):  [phone] On Call:  [phone] & follow prompts after 5pm & weekends Office Phone:  [phone] Office Fax:  [phone]

## 2014-07-29 ENCOUNTER — Other Ambulatory Visit: Payer: Self-pay

## 2014-07-29 DIAGNOSIS — Z1231 Encounter for screening mammogram for malignant neoplasm of breast: Secondary | ICD-10-CM

## 2014-11-25 ENCOUNTER — Encounter: Payer: Self-pay | Admitting: Internal Medicine

## 2015-01-05 ENCOUNTER — Telehealth: Payer: Self-pay | Admitting: Internal Medicine

## 2015-01-05 NOTE — Telephone Encounter (Signed)
FYI - Several attempts have been made to get patient scheduled for the mammogram that you ordered. Patient not returning calls & a letter was mailed to the patient on 11/25/14    [place] for data entry

## 2015-01-05 NOTE — Telephone Encounter (Signed)
Noted, thanks.  I will discuss with her at her next visit.  She is quite healthy so I would expect she would still want to get mammograms performed.

## 2015-04-16 ENCOUNTER — Encounter: Payer: Self-pay | Admitting: Internal Medicine

## 2015-05-20 ENCOUNTER — Telehealth: Payer: Self-pay

## 2015-05-20 NOTE — Telephone Encounter (Signed)
Letter mailed to patient regarding overdue mammogram. M.Denajah Farias 

## 2015-05-21 ENCOUNTER — Encounter: Payer: Self-pay | Admitting: Internal Medicine

## 2015-06-04 ENCOUNTER — Other Ambulatory Visit: Payer: Medicare HMO

## 2015-06-05 ENCOUNTER — Other Ambulatory Visit: Payer: Medicare Other

## 2015-06-05 DIAGNOSIS — M810 Age-related osteoporosis without current pathological fracture: Secondary | ICD-10-CM

## 2015-06-05 DIAGNOSIS — Z789 Other specified health status: Secondary | ICD-10-CM

## 2015-06-05 DIAGNOSIS — E785 Hyperlipidemia, unspecified: Secondary | ICD-10-CM

## 2015-06-06 LAB — B12 AND FOLATE PANEL
Folate: 18.6 ng/mL (ref >3.0)
Vitamin B-12: >2000 pg/mL — ABNORMAL HIGH (ref 211–946)

## 2015-06-06 LAB — CBC WITH DIFFERENTIAL/PLATELET
Basophils Absolute: 0 10*3/uL (ref 0.0–0.2)
Basos: 1 %
EOS (ABSOLUTE): 0.2 10*3/uL (ref 0.0–0.4)
Eos: 3 %
Hematocrit: 38.7 % (ref 34.0–46.6)
Hemoglobin: 13.1 g/dL (ref 11.1–15.9)
Immature Grans (Abs): 0 10*3/uL (ref 0.0–0.1)
Immature Granulocytes: 0 %
Lymphocytes Absolute: 1.8 10*3/uL (ref 0.7–3.1)
Lymphs: 31 %
MCH: 30.2 pg (ref 26.6–33.0)
MCHC: 33.9 g/dL (ref 31.5–35.7)
MCV: 89 fL (ref 79–97)
Monocytes Absolute: 0.5 10*3/uL (ref 0.1–0.9)
Monocytes: 9 %
Neutrophils Absolute: 3.4 10*3/uL (ref 1.4–7.0)
Neutrophils: 56 %
Platelets: 259 10*3/uL (ref 150–379)
RBC: 4.34 x10E6/uL (ref 3.77–5.28)
RDW: 12.9 % (ref 12.3–15.4)
WBC: 6 10*3/uL (ref 3.4–10.8)

## 2015-06-06 LAB — LIPID PANEL
Chol/HDL Ratio: 2.4 ratio units (ref 0.0–4.4)
Cholesterol, Total: 186 mg/dL (ref 100–199)
HDL: 77 mg/dL (ref >39)
LDL Calculated: 92 mg/dL (ref 0–99)
Triglycerides: 86 mg/dL (ref 0–149)
VLDL Cholesterol Cal: 17 mg/dL (ref 5–40)

## 2015-06-06 LAB — COMPREHENSIVE METABOLIC PANEL
ALT: 11 IU/L (ref 0–32)
AST: 17 IU/L (ref 0–40)
Albumin/Globulin Ratio: 1.8 (ref 1.1–2.5)
Albumin: 4.4 g/dL (ref 3.5–4.8)
Alkaline Phosphatase: 86 IU/L (ref 39–117)
BUN/Creatinine Ratio: 16 (ref 11–26)
BUN: 12 mg/dL (ref 8–27)
Bilirubin Total: 0.5 mg/dL (ref 0.0–1.2)
CO2: 27 mmol/L (ref 18–29)
Calcium: 9.8 mg/dL (ref 8.7–10.3)
Chloride: 101 mmol/L (ref 96–106)
Creatinine, Ser: 0.74 mg/dL (ref 0.57–1.00)
GFR calc Af Amer: 92 mL/min/{1.73_m2} (ref >59)
GFR calc non Af Amer: 80 mL/min/{1.73_m2} (ref >59)
Globulin, Total: 2.5 g/dL (ref 1.5–4.5)
Glucose: 89 mg/dL (ref 65–99)
Potassium: 4.4 mmol/L (ref 3.5–5.2)
Sodium: 142 mmol/L (ref 134–144)
Total Protein: 6.9 g/dL (ref 6.0–8.5)

## 2015-06-06 LAB — VITAMIN D 25 HYDROXY (VIT D DEFICIENCY, FRACTURES): Vit D, 25-Hydroxy: 42.3 ng/mL (ref 30.0–100.0)

## 2015-06-07 LAB — IRON AND TIBC
Iron Saturation: 30 % (ref 15–55)
Iron: 86 ug/dL (ref 27–139)
Total Iron Binding Capacity: 286 ug/dL (ref 250–450)
UIBC: 200 ug/dL (ref 118–369)

## 2015-06-07 LAB — SPECIMEN STATUS REPORT

## 2015-06-07 LAB — FERRITIN: Ferritin: 80 ng/mL (ref 15–150)

## 2015-06-07 LAB — VITAMIN B12: Vitamin B-12: >2000 pg/mL — ABNORMAL HIGH (ref 211–946)

## 2015-06-08 ENCOUNTER — Encounter: Payer: Self-pay | Admitting: Internal Medicine

## 2015-06-08 ENCOUNTER — Ambulatory Visit (INDEPENDENT_AMBULATORY_CARE_PROVIDER_SITE_OTHER): Payer: Medicare Other | Admitting: Internal Medicine

## 2015-06-08 VITALS — BP 142/78 | HR 74 | Temp 97.9°F | Ht 65.0 in | Wt 112.0 lb

## 2015-06-08 DIAGNOSIS — Z0000 Encounter for general adult medical examination without abnormal findings: Secondary | ICD-10-CM

## 2015-06-08 DIAGNOSIS — H532 Diplopia: Secondary | ICD-10-CM | POA: Diagnosis not present

## 2015-06-08 DIAGNOSIS — M810 Age-related osteoporosis without current pathological fracture: Secondary | ICD-10-CM

## 2015-06-08 DIAGNOSIS — Z789 Other specified health status: Secondary | ICD-10-CM | POA: Diagnosis not present

## 2015-06-08 DIAGNOSIS — E785 Hyperlipidemia, unspecified: Secondary | ICD-10-CM | POA: Diagnosis not present

## 2015-06-08 DIAGNOSIS — Z1231 Encounter for screening mammogram for malignant neoplasm of breast: Secondary | ICD-10-CM

## 2015-06-08 NOTE — Progress Notes (Signed)
Patient ID: Adriana [name], female   DOB: 08-27-1940, 75 y.o.   MRN: [id number]   Location:  [facility] clinic Provider: Octavio Matheney L. Mariea Clonts, D.O., C.M.D.  Patient Care Team: Gayland Curry, DO as PCP - General (Geriatric Medicine) Shon Hough, MD as Consulting Physician (Ophthalmology)  Extended Emergency Contact Information Primary Emergency Contact: [facility] Address: [address]          [place], [place] [address] Johnnette Litter of [place] Phone: [phone] Mobile Phone: [phone] Relation: Spouse   Goals of Care: Advanced Directive information Advanced Directives 06/06/2014  Does patient have an advance directive? Yes  Type of Paramedic of [place];Living will  Does patient want to make changes to advanced directive? No - Patient declined  Copy of advanced directive(s) in chart? No - copy requested   Chief Complaint  Patient presents with  . Annual Exam    yearly checkup, discuss labs, ekg, mmse    HPI: Patient is a 75 y.o. female seen in today for an annual wellness exam.   Has MVP and worried about her EKG.  No problem on it.    No changes with her right hernia.  Not abiding by the lifting limits, but is not pulling like she did when she pulled a root out of the ground.    Depression screen [facility] 2/9 06/08/2015 06/06/2014 02/08/2013 02/07/2011  Decreased Interest 0 0 0 0  Down, Depressed, Hopeless 0 0 0 0  PHQ - 2 Score 0 0 0 0    Fall Risk  06/08/2015 06/06/2014 02/08/2013  Falls in the past year? No No No   No flowsheet data found.  Health Maintenance  Topic Date Due  . MAMMOGRAM  05/22/2012  . INFLUENZA VACCINE  11/03/2015  . TETANUS/TDAP  12/15/2023  . COLONOSCOPY  12/28/2023  . DEXA SCAN  Completed  . ZOSTAVAX  Completed  . PNA vac Low Risk Adult  Completed   Urinary incontinence?  No difficulty. Functional Status Survey: Is the patient deaf or have difficulty hearing?: No Does the patient have difficulty seeing, even when wearing  glasses/contacts?: No Does the patient have difficulty concentrating, remembering, or making decisions?: No Does the patient have difficulty walking or climbing stairs?: No Does the patient have difficulty dressing or bathing?: No Does the patient have difficulty doing errands alone such as visiting a doctor's office or shopping?: No Exercise?  Getting exercise [place] 2[place] times per week and active working in [employer].    Diet?  Vegan diet more or less.  Does eat fish or other food if at a friends' house.   Vision Screening Comments: DR. Kathrin Penner 05/06/2015  Hearing:  No problems Dentition:  Has old teeth, she says and sees her dentist regularly.  Had taken actonel in the past and fosamax in the past and forteo.  Nothing since for osteoporosis.  Crown was being resorbed on molar.  Dentist wondered about if it was related to her bisphosphonates.   Pain:  No pain.    Had double vision a lot.  Dr. Kathrin Penner unsure why.  Happened after cataract removed in one eye.  Worsens if more fatigued.  Not when reading, but when at a distance.  Sometimes will see two of things.  Could not be corrected.   No known tick bites.    Past Medical History  Diagnosis Date  . Cellulitis, toe   . Hyperlipidemia   . GERD (gastroesophageal reflux disease)   . Osteoporosis   .  Diverticulitis   . Mitral valve prolapse   . Osteomyelitis of ankle and foot ([place])   . Cholelithiasis   . Hiatal hernia     Past Surgical History  Procedure Laterality Date  . Colonoscopy  05/2003    No polyps, hemorrhoids,and diverticulosis   . Breast biopsy      Left,negative     Social History   Social History  . Marital Status: Married    Spouse Name: N/A  . Number of Children: N/A  . Years of Education: N/A   Occupational History  . Not on file.   Social History Main Topics  . Smoking status: Never Smoker   . Smokeless tobacco: Never Used  . Alcohol Use: Yes     Comment: occasional  . Drug Use: No  .  Sexual Activity: Not on file   Other Topics Concern  . Not on file   Social History Narrative   Dr.Stoneburner, Ophthalmologist   Dr.Peterson, Urologist   Dr.Lomax, Dermatologist      No Known Allergies    Medication List       This list is accurate as of: 06/08/15  2:47 PM.  Always use your most recent med list.               Vitamin D3 2000 units capsule  Take 1 capsule (2,000 Units total) by mouth daily.         Review of Systems:  Review of Systems  Constitutional: Negative for fever, chills, weight loss and malaise/fatigue.  HENT: Negative for hearing loss.   Eyes: Positive for double vision. Negative for blurred vision, photophobia, pain, discharge and redness.  Respiratory: Negative for shortness of breath.   Cardiovascular: Negative for chest pain and leg swelling.  Gastrointestinal: Negative for constipation, blood in stool and melena.  Genitourinary: Negative for dysuria, urgency and frequency.  Musculoskeletal: Negative for joint pain and falls.  Skin: Negative for itching and rash.  Neurological: Negative for dizziness, loss of consciousness and weakness.  Endo/Heme/Allergies: Does not bruise/bleed easily.  Psychiatric/Behavioral: Negative for memory loss.    Physical Exam: Filed Vitals:   06/08/15 1341  BP: 142/78  Pulse: 74  Temp: 97.9 F (36.6 C)  TempSrc: Oral  Height: 5\' 5"  (1.651 m)  Weight: 112 lb (50.803 kg)  SpO2: 94%   Body mass index is 18.64 kg/(m^2). Physical Exam  Constitutional: She is oriented to person, place, and time. She appears well-developed and well-nourished. No distress.  HENT:  Head: Normocephalic and atraumatic.  Right Ear: External ear normal.  Left Ear: External ear normal.  Nose: Nose normal.  Mouth/Throat: Oropharynx is clear and moist. No oropharyngeal exudate.  Eyes: Conjunctivae and EOM are normal. Pupils are equal, round, and reactive to light.  Neck: Normal range of motion. Neck supple. No JVD present.  No thyromegaly present.  Cardiovascular: Normal rate, regular rhythm, normal heart sounds and intact distal pulses.   Pulmonary/Chest: Effort normal and breath sounds normal. No respiratory distress. Right breast exhibits no inverted nipple, no mass, no nipple discharge, no skin change and no tenderness. Left breast exhibits no inverted nipple, no mass, no nipple discharge, no skin change and no tenderness.  Abdominal: Soft. Bowel sounds are normal.  Genitourinary:  deferred  Musculoskeletal: Normal range of motion. She exhibits no tenderness.  Lymphadenopathy:    She has no cervical adenopathy.       Right cervical: No superficial cervical, no deep cervical and no posterior cervical adenopathy present.  Left cervical: No superficial cervical, no deep cervical and no posterior cervical adenopathy present.    She has no axillary adenopathy.  Neurological: She is alert and oriented to person, place, and time. She has normal reflexes. No cranial nerve deficit.  Skin: Skin is warm and dry.  Psychiatric: She has a normal mood and affect. Her behavior is normal. Judgment and thought content normal.   Labs reviewed: Basic Metabolic Panel:  Recent Labs  06/05/15 0844  NA 142  K 4.4  CL 101  CO2 27  GLUCOSE 89  BUN 12  CREATININE 0.74  CALCIUM 9.8   Liver Function Tests:  Recent Labs  06/05/15 0844  AST 17  ALT 11  ALKPHOS 86  BILITOT 0.5  PROT 6.9  ALBUMIN 4.4   No results for input(s): LIPASE, AMYLASE in the last 8760 hours. No results for input(s): AMMONIA in the last 8760 hours. CBC:  Recent Labs  06/05/15 0844  WBC 6.0  NEUTROABS 3.4  HCT 38.7  MCV 89  PLT 259   Lipid Panel:  Recent Labs  06/05/15 0844  CHOL 186  HDL 77  LDLCALC 92  TRIG 86  CHOLHDL 2.4   Procedures: EKG:  NSR at 78bpm, no acute ischemia or infarct  Assessment/Plan 1. Routine general medical examination at a health care facility - up to date at present on preventive care - CBC  with Differential/Platelet; Future - Comprehensive metabolic panel; Future - Lipid panel; Future - B12 and Folate Panel; Future  2. Hyperlipidemia -lipids at goal, on vegan diet - EKG 12-Lead - Lipid panel; Future  3. Diplopia - new since cataract surgery, but ophthalmologist does not think it is related - was seeing double horizontally to her right - I suggested a neuro-ophthalmologist and sounds like this is already in the works by her ophtho  4. Senile osteoporosis -cont vitamin D supplement -she does not want to take any medications for this - cont weightbearing exercise  5. Consumes a vegan diet - B12 and Folate Panel; Future--these were excellent when last checked  6. Encounter for screening mammogram for breast cancer - MM DIGITAL SCREENING BILATERAL; Future ordered  Labs/tests ordered:   Orders Placed This Encounter  Procedures  . MM DIGITAL SCREENING BILATERAL    Standing Status: Future     Number of Occurrences:      Standing Expiration Date: 08/07/2016    Scheduling Instructions:     [facility]    Order Specific Question:  Reason for exam:    Answer:  routine screening mammogram, f/ho breast cancer in her older sister    Order Specific Question:  Preferred imaging location?    Answer:  External  . CBC with Differential/Platelet    Standing Status: Future     Number of Occurrences:      Standing Expiration Date: 06/07/2017  . Comprehensive metabolic panel    Standing Status: Future     Number of Occurrences:      Standing Expiration Date: 06/07/2017    Order Specific Question:  Has the patient fasted?    Answer:  Yes  . Lipid panel    Standing Status: Future     Number of Occurrences:      Standing Expiration Date: 06/07/2017    Order Specific Question:  Has the patient fasted?    Answer:  Yes  . B12 and Folate Panel    Standing Status: Future     Number of Occurrences:      Standing Expiration  Date: 06/07/2017  . EKG 12-Lead    Next appt: 1 year for annual  with labs before and PRN  Luisantonio Adinolfi L. Roselene Gray, D.O. [facility] [address], [place] [address] Cell Phone (Mon-Fri 8am-5pm):  [phone] On Call:  [phone] & follow prompts after 5pm & weekends Office Phone:  [phone] Office Fax:  [phone]

## 2015-06-14 DIAGNOSIS — Z789 Other specified health status: Secondary | ICD-10-CM | POA: Insufficient documentation

## 2015-06-19 LAB — HM MAMMOGRAPHY

## 2015-06-22 ENCOUNTER — Encounter: Payer: Self-pay | Admitting: *Deleted

## 2015-06-25 LAB — HM MAMMOGRAPHY

## 2015-06-26 ENCOUNTER — Encounter: Payer: Self-pay | Admitting: *Deleted

## 2015-07-09 ENCOUNTER — Encounter: Payer: Self-pay | Admitting: Internal Medicine

## 2015-12-08 ENCOUNTER — Ambulatory Visit (INDEPENDENT_AMBULATORY_CARE_PROVIDER_SITE_OTHER): Payer: Medicare Other

## 2015-12-08 DIAGNOSIS — Z23 Encounter for immunization: Secondary | ICD-10-CM | POA: Diagnosis not present

## 2016-05-25 ENCOUNTER — Other Ambulatory Visit: Payer: Self-pay

## 2016-05-25 DIAGNOSIS — M810 Age-related osteoporosis without current pathological fracture: Secondary | ICD-10-CM

## 2016-05-25 DIAGNOSIS — E7849 Other hyperlipidemia: Secondary | ICD-10-CM

## 2016-05-25 DIAGNOSIS — Z0000 Encounter for general adult medical examination without abnormal findings: Secondary | ICD-10-CM

## 2016-06-06 ENCOUNTER — Other Ambulatory Visit: Payer: Self-pay | Admitting: Internal Medicine

## 2016-06-06 ENCOUNTER — Other Ambulatory Visit: Payer: Medicare Other

## 2016-06-06 ENCOUNTER — Ambulatory Visit (INDEPENDENT_AMBULATORY_CARE_PROVIDER_SITE_OTHER): Payer: Medicare Other

## 2016-06-06 VITALS — BP 170/90 | HR 63 | Temp 97.5°F | Ht 65.0 in | Wt 106.0 lb

## 2016-06-06 DIAGNOSIS — Z0000 Encounter for general adult medical examination without abnormal findings: Secondary | ICD-10-CM | POA: Diagnosis not present

## 2016-06-06 DIAGNOSIS — E7849 Other hyperlipidemia: Secondary | ICD-10-CM

## 2016-06-06 DIAGNOSIS — M810 Age-related osteoporosis without current pathological fracture: Secondary | ICD-10-CM

## 2016-06-06 LAB — CBC WITH DIFFERENTIAL/PLATELET
Basophils Absolute: 66 cells/uL (ref 0–200)
Basophils Relative: 1 %
Eosinophils Absolute: 132 cells/uL (ref 15–500)
Eosinophils Relative: 2 %
HCT: 41.5 % (ref 35.0–45.0)
Hemoglobin: 13.4 g/dL (ref 11.7–15.5)
Lymphocytes Relative: 25 %
Lymphs Abs: 1650 cells/uL (ref 850–3900)
MCH: 29.6 pg (ref 27.0–33.0)
MCHC: 32.3 g/dL (ref 32.0–36.0)
MCV: 91.8 fL (ref 80.0–100.0)
MPV: 10.1 fL (ref 7.5–12.5)
Monocytes Absolute: 462 cells/uL (ref 200–950)
Monocytes Relative: 7 %
Neutro Abs: 4290 cells/uL (ref 1500–7800)
Neutrophils Relative %: 65 %
Platelets: 289 10*3/uL (ref 140–400)
RBC: 4.52 MIL/uL (ref 3.80–5.10)
RDW: 13.8 % (ref 11.0–15.0)
WBC: 6.6 10*3/uL (ref 3.8–10.8)

## 2016-06-06 LAB — COMPLETE METABOLIC PANEL WITH GFR
ALT: 12 U/L (ref 6–29)
AST: 16 U/L (ref 10–35)
Albumin: 4.2 g/dL (ref 3.6–5.1)
Alkaline Phosphatase: 78 U/L (ref 33–130)
BUN: 15 mg/dL (ref 7–25)
CO2: 29 mmol/L (ref 20–31)
Calcium: 9.8 mg/dL (ref 8.6–10.4)
Chloride: 105 mmol/L (ref 98–110)
Creat: 0.63 mg/dL (ref 0.60–0.93)
GFR, Est African American: >89 mL/min (ref >=60)
GFR, Est Non African American: 87 mL/min (ref >=60)
Glucose, Bld: 99 mg/dL (ref 65–99)
Potassium: 4.5 mmol/L (ref 3.5–5.3)
Sodium: 141 mmol/L (ref 135–146)
Total Bilirubin: 0.6 mg/dL (ref 0.2–1.2)
Total Protein: 6.6 g/dL (ref 6.1–8.1)

## 2016-06-06 LAB — LIPID PANEL
Cholesterol: 186 mg/dL (ref <200)
HDL: 75 mg/dL (ref >50)
LDL Cholesterol: 92 mg/dL (ref <100)
Total CHOL/HDL Ratio: 2.5 Ratio (ref <5.0)
Triglycerides: 95 mg/dL (ref <150)
VLDL: 19 mg/dL (ref <30)

## 2016-06-06 LAB — FOLATE: Folate: 12.5 ng/mL (ref >5.4)

## 2016-06-06 LAB — VITAMIN B12: Vitamin B-12: 656 pg/mL (ref 200–1100)

## 2016-06-06 NOTE — Progress Notes (Signed)
   I reviewed health advisor's note, was available for consultation and agree with the assessment and plan as written.  Will plan to address weight loss, elevated bp, palpitations and anxiety.  Morrisa Aldaba L. Gustavo Meditz, D.O. [facility] [address], [place] [address] Cell Phone (Mon-Fri 8am-5pm):  [phone] On Call:  [phone] & follow prompts after 5pm & weekends Office Phone:  [phone] Office Fax:  [phone]   Quick Notes   Health Maintenance:  None    Abnormal Screen: MMSE-30/30-Failed Clock Test    Patient Concerns:  Pt recently lost her husband in Dec. 2017 from heart attack. Since then she has been having heart palpations, racing heart beat. Pt does have mitral valve prolapse. She would like to discuss if this could be anxiety or if something else is going on. BP was elevated today. Pt has also lost 6lb over the past year, despite eating more.     Nurse Concerns:  BP 170/90; Recheck: 150/84

## 2016-06-06 NOTE — Progress Notes (Signed)
Subjective:   Adriana [name] is a 76 y.o. female who presents for Medicare Annual (Subsequent) preventive examination.  Review of Systems:  Cardiac Risk Factors include: advanced age (>32men, >71 women);dyslipidemia;family history of premature cardiovascular disease     Objective:     Vitals: BP (!) 170/90 (BP Location: Right Arm, Patient Position: Sitting, Cuff Size: Normal)   Pulse 63   Temp 97.5 F (36.4 C) (Oral)   Ht 5\' 5"  (1.651 m)   Wt 106 lb (48.1 kg)   SpO2 96%   BMI 17.64 kg/m   Body mass index is 17.64 kg/m.   Tobacco History  Smoking Status  . Never Smoker  Smokeless Tobacco  . Never Used     Counseling given: No   Past Medical History:  Diagnosis Date  . Cellulitis, toe   . Cholelithiasis   . Diverticulitis   . GERD (gastroesophageal reflux disease)   . Hiatal hernia   . Hyperlipidemia   . Mitral valve prolapse   . 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 Osteoporosis    Past Surgical History:  Procedure Laterality Date  . BREAST BIOPSY     Left,negative   . COLONOSCOPY  05/2003   No polyps, hemorrhoids,and diverticulosis    Family History  Problem Relation Age of Onset  . Pneumonia Father 86  . Cerebrovascular Accident Mother 2  . Breast cancer Sister 69   History  Sexual Activity  . Sexual activity: No    Outpatient Encounter Prescriptions as of 06/06/2016  Medication Sig  . Cholecalciferol (VITAMIN D3) 2000 UNITS capsule Take 1 capsule (2,000 Units total) by mouth daily.   No facility-administered encounter medications on file as of 06/06/2016.     Activities of Daily Living In your present state of health, do you have any difficulty performing the following activities: 06/06/2016 06/08/2015  Hearing? N N  Vision? N N  Difficulty concentrating or making decisions? N N  Walking or climbing stairs? N N  Dressing or bathing? N N  Doing errands, shopping? N N  Preparing Food and eating ? N -  Using the Toilet? N -  In the past  six months, have you accidently leaked urine? Y -  Do you have problems with loss of bowel control? N -  Managing your Medications? N -  Managing your Finances? N -  Housekeeping or managing your Housekeeping? N -  Some recent data might be hidden    Patient Care Team: Gayland Curry, DO as PCP - General (Geriatric Medicine) Shon Hough, MD as Consulting Physician (Ophthalmology) Gevena Cotton, MD as Consulting Physician (Ophthalmology)    Assessment:    Exercise Activities and Dietary recommendations Current Exercise Habits: Home exercise routine, Type of exercise: treadmill;walking;strength training/weights, Time (Minutes): 60, Frequency (Times/Week): 2, Weekly Exercise (Minutes/Week): 120, Intensity: Mild, Exercise limited by: cardiac condition(s)  Goals    . <enter goal here>          Starting 06/06/16, I will maintain my current lifestyle.       Fall Risk Fall Risk  06/06/2016 06/08/2015 06/06/2014 02/08/2013  Falls in the past year? No No No No   Depression Screen PHQ 2/9 Scores 06/06/2016 06/08/2015 06/06/2014 02/08/2013  PHQ - 2 Score 0 0 0 0  Exception Documentation Other- indicate reason in comment box - - -  Not completed Husband passed away in 18-Apr-2016. - - -     Cognitive Function MMSE - Mini Mental State Exam 06/06/2016  06/08/2015  Orientation to time 5 5  Orientation to Place 5 5  Registration 3 3  Attention/ Calculation 5 5  Recall 3 3  Language- name 2 objects 2 2  Language- repeat 1 1  Language- follow 3 step command 3 3  Language- read & follow direction 1 1  Write a sentence 1 1  Copy design 1 1  Total score 30 30     Hearing Screening Comments: Pt has never had a hearing screen. At this time, she denies any hearing loss.  Vision Screening Comments: Last eye exam was done in Jan 2018 with Dr. Kathrin Penner. Pt also sees Dr. Frederico Hamman for her double vision.     Immunization History  Administered Date(s) Administered  . Influenza Split 01/30/2012  .  Influenza, Seasonal, Injecte, Preservative Fre 01/03/2015  . Influenza,inj,Quad PF,36+ Mos 01/08/2013, 12/08/2015  . Influenza-Unspecified 01/03/2014  . Pneumococcal Conjugate-13 06/06/2014  . Pneumococcal Polysaccharide-23 06/02/2005  . Td 04/04/2002  . Tdap 12/14/2013  . Zoster 04/04/2009   Screening Tests Health Maintenance  Topic Date Due  . MAMMOGRAM  06/24/2016  . TETANUS/TDAP  12/15/2023  . INFLUENZA VACCINE  Completed  . DEXA SCAN  Completed  . PNA vac Low Risk Adult  Completed      Plan:    I have personally reviewed and addressed the Medicare Annual Wellness questionnaire and have noted the following in the patient's chart:  A. Medical and social history B. Use of alcohol, tobacco or illicit drugs  C. Current medications and supplements D. Functional ability and status E.  Nutritional status F.  Physical activity G. Advance directives H. List of other physicians I.  Hospitalizations, surgeries, and ER visits in previous 12 months J.  [place] to include hearing, vision, cognitive, depression L. Referrals and appointments - none  In addition, I have reviewed and discussed with patient certain preventive protocols, quality metrics, and best practice recommendations. A written personalized care plan for preventive services as well as general preventive health recommendations were provided to patient.  See attached scanned questionnaire for additional information.   Signed,   Allyn Kenner, LPN Health Advisor

## 2016-06-06 NOTE — Patient Instructions (Addendum)
Adriana [name] , Thank you for taking time to come for your Medicare Wellness Visit. I appreciate your ongoing commitment to your health goals. Please review the following plan we discussed and let me know if I can assist you in the future.   These are the goals we discussed: Goals    . <enter goal here>          Starting 06/06/16, I will maintain my current lifestyle.        This is a list of the screening recommended for you and due dates:  Health Maintenance  Topic Date Due  . Mammogram  06/24/2016  . Tetanus Vaccine  12/15/2023  . Flu Shot  Completed  . DEXA scan (bone density measurement)  Completed  . Pneumonia vaccines  Completed  Preventive Care for Adults  A healthy lifestyle and preventive care can promote health and wellness. Preventive health guidelines for adults include the following key practices.  . A routine yearly physical is a good way to check with your health care provider about your health and preventive screening. It is a chance to share any concerns and updates on your health and to receive a thorough exam.  . Visit your dentist for a routine exam and preventive care every 6 months. Brush your teeth twice a day and floss once a day. Good oral hygiene prevents tooth decay and gum disease.  . The frequency of eye exams is based on your age, health, family medical history, use  of contact lenses, and other factors. Follow your health care provider's ecommendations for frequency of eye exams.  . Eat a healthy diet. Foods like vegetables, fruits, whole grains, low-fat dairy products, and lean protein foods contain the nutrients you need without too many calories. Decrease your intake of foods high in solid fats, added sugars, and salt. Eat the right amount of calories for you. Get information about a proper diet from your health care provider, if necessary.  . Regular physical exercise is one of the most important things you can do for your health. Most adults should  get at least 150 minutes of moderate-intensity exercise (any activity that increases your heart rate and causes you to sweat) each week. In addition, most adults need muscle-strengthening exercises on 2 or more days a week.  Silver Sneakers may be a benefit available to you. To determine eligibility, you may visit the website: www.silversneakers.com or contact program at 9862566536 Mon-Fri between 8AM-8PM.   . Maintain a healthy weight. The body mass index (BMI) is a screening tool to identify possible weight problems. It provides an estimate of body fat based on height and weight. Your health care provider can find your BMI and can help you achieve or maintain a healthy weight.   For adults 20 years and older: ? A BMI below 18.5 is considered underweight. ? A BMI of 18.5 to 24.9 is normal. ? A BMI of 25 to 29.9 is considered overweight. ? A BMI of 30 and above is considered obese.   . Maintain normal blood lipids and cholesterol levels by exercising and minimizing your intake of saturated fat. Eat a balanced diet with plenty of fruit and vegetables. Blood tests for lipids and cholesterol should begin at age 66 and be repeated every 5 years. If your lipid or cholesterol levels are high, you are over 50, or you are at high risk for heart disease, you may need your cholesterol levels checked more frequently. Ongoing high lipid and cholesterol levels should  be treated with medicines if diet and exercise are not working.  . If you smoke, find out from your health care provider how to quit. If you do not use tobacco, please do not start.  . If you choose to drink alcohol, please do not consume more than 2 drinks per day. One drink is considered to be 12 ounces (355 mL) of beer, 5 ounces (148 mL) of wine, or 1.5 ounces (44 mL) of liquor.  . If you are 44-76 years old, ask your health care provider if you should take aspirin to prevent strokes.  . Use sunscreen. Apply sunscreen liberally and  repeatedly throughout the day. You should seek shade when your shadow is shorter than you. Protect yourself by wearing long sleeves, pants, a wide-brimmed hat, and sunglasses year round, whenever you are outdoors.  . Once a month, do a whole body skin exam, using a mirror to look at the skin on your back. Tell your health care provider of new moles, moles that have irregular borders, moles that are larger than a pencil eraser, or moles that have changed in shape or color.

## 2016-06-09 ENCOUNTER — Encounter: Payer: Self-pay | Admitting: Internal Medicine

## 2016-06-09 ENCOUNTER — Ambulatory Visit (INDEPENDENT_AMBULATORY_CARE_PROVIDER_SITE_OTHER): Payer: Medicare Other | Admitting: Internal Medicine

## 2016-06-09 VITALS — BP 118/70 | HR 87 | Temp 98.5°F | Ht 65.0 in | Wt 106.0 lb

## 2016-06-09 DIAGNOSIS — H518 Other specified disorders of binocular movement: Secondary | ICD-10-CM

## 2016-06-09 DIAGNOSIS — M810 Age-related osteoporosis without current pathological fracture: Secondary | ICD-10-CM | POA: Diagnosis not present

## 2016-06-09 DIAGNOSIS — R634 Abnormal weight loss: Secondary | ICD-10-CM

## 2016-06-09 DIAGNOSIS — K219 Gastro-esophageal reflux disease without esophagitis: Secondary | ICD-10-CM | POA: Diagnosis not present

## 2016-06-09 DIAGNOSIS — Z789 Other specified health status: Secondary | ICD-10-CM

## 2016-06-09 DIAGNOSIS — Z0000 Encounter for general adult medical examination without abnormal findings: Secondary | ICD-10-CM

## 2016-06-09 DIAGNOSIS — R002 Palpitations: Secondary | ICD-10-CM | POA: Diagnosis not present

## 2016-06-09 LAB — TSH: TSH: 1.66 mIU/L

## 2016-06-09 NOTE — Progress Notes (Signed)
Provider:  Rexene Edison. Mariea Clonts, D.O., C.M.D. Location:   [facility]  Place of Service:    clinic Previous PCP: Hollace Kinnier, DO Patient Care Team: Gayland Curry, DO as PCP - General (Geriatric Medicine) Shon Hough, MD as Consulting Physician (Ophthalmology) Gevena Cotton, MD as Consulting Physician (Ophthalmology)  Extended Emergency Contact Information Primary Emergency Contact: [facility] Address: [address]          [place], [place] [address] [name] Phone: [phone] Relation: Son Secondary Emergency Contact: [facility] Address: [address]          [place],  [address] [name] Phone: [phone] Relation: Son  Goals of Care: Advanced Directive information Advanced Directives 06/09/2016  Does Patient Have a Medical Advance Directive? Yes  Type of Advance Directive [place]  Does patient want to make changes to medical advance directive? -  Copy of [place] in Chart? Yes   Chief Complaint  Patient presents with  . Annual Exam    CPE    HPI: Patient is a 76 y.o. female seen today for an annual physical exam.  Senile osteoporosis:  Used to take forteo, due for bone density.    Has not taken anything but dietary calcium for several years.  Consumes a vegetarian diet.  She has gone off the diet a little bit.    Is eating fish, eggs, cheese and yogurt.    Diplopia:  Follows with Dr. Emi Belfast in 1/18.  Also sees Dr. Frederico Hamman for the diplopia itself. 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  She has expensive glasses with prisms and she can drive with those.    Elevated bp at wellness:  BP normal today.  She had been checking her bp at home soon after her husband passed she was getting an error message.  Last night it was 140/50.  She can also feel her pulse up when she's not doing anything.    Anxiety, palpitations, weight loss:  Scale here 106 and down to 102 at home.   Appetite fine now after the first three weeks.  First three weeks of grief and family things kept her from exercising.  She says she was 108 before Larry's death.  She has not been able to gain it back despite eating well and doing some exercise.    Had the fibroglandular tissue on her mammo last year and required [place] which was then clear with 6 mo f/u recommended which she does not want to pursue.  She would not want chemo but she might consider a surgery if it was needed.  Her first husband had lymphoma and she saw him go through chemo.  She does not want more mammograms.  No changes in bowel habits.  Had normal cscope (except diverticulosis) in 2015 w/o f/u needs.  Has hemorrhoids she's had forever.  No new aches or pains.    Past Medical History:  Diagnosis Date  . Cellulitis, toe   . Cholelithiasis   . Diverticulitis   . GERD (gastroesophageal reflux disease)   . Hiatal hernia   . Hyperlipidemia   . Mitral valve prolapse   . 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 Osteoporosis    Past Surgical History:  Procedure Laterality Date  . BREAST BIOPSY     Left,negative   . COLONOSCOPY  05/2003   No polyps, hemorrhoids,and diverticulosis     reports that she has never smoked. She has never used smokeless  tobacco. She reports that she drinks alcohol. She reports that she does not use drugs.   Family History  Problem Relation Age of Onset  . Pneumonia Father 62  . Cerebrovascular Accident Mother 78  . Breast cancer Sister 57    Health Maintenance  Topic Date Due  . MAMMOGRAM  06/24/2016  . TETANUS/TDAP  12/15/2023  . INFLUENZA VACCINE  Completed  . DEXA SCAN  Completed  . PNA vac Low Risk Adult  Completed    No Known Allergies  Allergies as of 06/09/2016   No Known Allergies     Medication List       Accurate as of 06/09/16  1:46 PM. Always use your most recent med list.          Vitamin D3 2000 units capsule Take 1 capsule (2,000 Units total) by mouth daily.         Review of Systems  Constitutional: Positive for weight loss. Negative for chills, diaphoresis, fever and malaise/fatigue.  HENT: Negative for congestion and hearing loss.   Eyes: Positive for double vision. Negative for blurred vision.  Respiratory: Negative for cough and shortness of breath.   Cardiovascular: Positive for palpitations. Negative for chest pain, orthopnea, claudication, leg swelling and PND.  Gastrointestinal: Negative for abdominal pain, blood in stool, constipation, diarrhea, heartburn, melena, nausea and vomiting.       Appetite good  Genitourinary: Negative for dysuria, frequency and urgency.  Musculoskeletal: Negative for falls, joint pain and myalgias.  Skin: Negative for itching and rash.  Neurological: Negative for dizziness, sensory change, loss of consciousness and weakness.  Endo/Heme/Allergies: Negative for environmental allergies and polydipsia. Does not bruise/bleed easily.  Psychiatric/Behavioral: Negative for depression and memory loss. The patient is nervous/anxious.        Is grieving the loss of her husband who just passed away in 04-06-16    Vitals:   06/09/16 1341  BP: 118/70  Pulse: 87  Temp: 98.5 F (36.9 C)  TempSrc: Oral  SpO2: 98%  Weight: 106 lb (48.1 kg)  Height: 5\' 5"  (1.651 m)   Body mass index is 17.64 kg/m. Physical Exam  Constitutional: She is oriented to person, place, and time. She appears well-developed and well-nourished. No distress.  HENT:  Head: Normocephalic and atraumatic.  Right Ear: External ear normal.  Left Ear: External ear normal.  Nose: Nose normal.  Mouth/Throat: Oropharynx is clear and moist. No oropharyngeal exudate.  Eyes: Conjunctivae and EOM are normal. Pupils are equal, round, and reactive to light.  Neck: Normal range of motion. Neck supple. No JVD present.  Cardiovascular: Normal rate, regular rhythm, normal heart sounds and intact distal pulses.  Exam reveals no gallop and no friction rub.    No murmur heard. Pulmonary/Chest: Effort normal and breath sounds normal. No respiratory distress. She has no wheezes. She has no rales.  Abdominal: Soft. Bowel sounds are normal. She exhibits no distension and no mass. There is no tenderness. There is no rebound and no guarding. No hernia.  Musculoskeletal: Normal range of motion. She exhibits no tenderness.  Lymphadenopathy:    She has no cervical adenopathy.  Neurological: She is alert and oriented to person, place, and time. She displays normal reflexes. No cranial nerve deficit or sensory deficit. She exhibits normal muscle tone. Coordination normal.  Skin: Skin is warm and dry. Capillary refill takes less than 2 seconds.  Psychiatric: She has a normal mood and affect. Her behavior is normal. Judgment and thought content normal.  Labs reviewed: Basic Metabolic Panel:  Recent Labs  06/06/16 0822  NA 141  K 4.5  CL 105  CO2 29  GLUCOSE 99  BUN 15  CREATININE 0.63  CALCIUM 9.8   Liver Function Tests:  Recent Labs  06/06/16 0822  AST 16  ALT 12  ALKPHOS 78  BILITOT 0.6  PROT 6.6  ALBUMIN 4.2   No results for input(s): LIPASE, AMYLASE in the last 8760 hours. No results for input(s): AMMONIA in the last 8760 hours. CBC:  Recent Labs  06/06/16 0822  WBC 6.6  NEUTROABS 4,290  HGB 13.4  HCT 41.5  MCV 91.8  PLT 289   Cardiac Enzymes: No results for input(s): CKTOTAL, CKMB, CKMBINDEX, TROPONINI in the last 8760 hours. BNP: Invalid input(s): POCBNP No results found for: HGBA1C Lab Results  Component Value Date   TSH 2.290 06/02/2014   Lab Results  Component Value Date   VITAMINB12 656 06/06/2016   Lab Results  Component Value Date   FOLATE 12.5 06/06/2016   Lab Results  Component Value Date   IRON 86 06/05/2015   TIBC 286 06/05/2015   FERRITIN 80 06/05/2015    Assessment/Plan 1. Senile osteoporosis - has been on forteo for a period of years before she came to see me - is due to reassess bone  density and determine further therapy--she has no fracture history -would suggest prolia next as treatment if she is still in the osteoporotic range - DG Bone Density; Future  2. Gastroesophageal reflux disease without esophagitis -no c/o of recent symptoms, avoid causative foods and follow good conservative measures like elevating head of bed and avoiding eating before hs  3. Weight loss -most weight loss since last year, but she reports majority has been since her husband passed away which seems appropriate/typical -advised to continue to eat her expanded diet and do some of her weight training to gain back muscle mass -will check tsh also to ensure thyroid is not now abnormal (at her request) -we discussed doing her annual mammo to rule out breast ca as a cause, but she does not wish to b/c she would not pursue treatment if she did have breast cancer  4. 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 Heart palpitations - EKG 12-Lead done with normal sinus rhythm at 79bpm - no symptoms during visit, but has been anxious and griefing her husband's passing so suspect some pvcs from that and that this also has caused some hypertension--bp here today also normal  7.  Annual exam -performed today -up to date on all except bone density and does not want more mammograms--has had fibrocystic breasts and does not want to go through more tests, biopsies when she would not get treated anyway  Labs/tests ordered:  Add on TSH  F/u 3 mos on weight loss, grief  Sumner Kirchman L. Anjuli Gemmill, D.O. [facility] [address], [place] [address] Cell Phone (Mon-Fri 8am-5pm):  [phone] On Call:  [phone] & follow prompts after 5pm & weekends Office Phone:  [phone] Office Fax:  [phone]

## 2016-06-10 ENCOUNTER — Encounter: Payer: Self-pay | Admitting: *Deleted

## 2016-09-02 ENCOUNTER — Encounter: Payer: Self-pay | Admitting: *Deleted

## 2016-09-02 LAB — HM MAMMOGRAPHY

## 2016-09-02 LAB — HM DEXA SCAN

## 2016-09-09 ENCOUNTER — Ambulatory Visit (INDEPENDENT_AMBULATORY_CARE_PROVIDER_SITE_OTHER): Payer: Medicare Other | Admitting: Internal Medicine

## 2016-09-09 ENCOUNTER — Other Ambulatory Visit: Payer: Self-pay | Admitting: Internal Medicine

## 2016-09-09 ENCOUNTER — Encounter: Payer: Self-pay | Admitting: Internal Medicine

## 2016-09-09 VITALS — BP 128/80 | HR 78 | Temp 98.2°F | Wt 105.0 lb

## 2016-09-09 DIAGNOSIS — R1011 Right upper quadrant pain: Secondary | ICD-10-CM | POA: Diagnosis not present

## 2016-09-09 DIAGNOSIS — M810 Age-related osteoporosis without current pathological fracture: Secondary | ICD-10-CM | POA: Diagnosis not present

## 2016-09-09 DIAGNOSIS — R634 Abnormal weight loss: Secondary | ICD-10-CM | POA: Diagnosis not present

## 2016-09-09 DIAGNOSIS — K8020 Calculus of gallbladder without cholecystitis without obstruction: Secondary | ICD-10-CM | POA: Insufficient documentation

## 2016-09-09 LAB — HEPATIC FUNCTION PANEL
ALT: 13 U/L (ref 6–29)
AST: 14 U/L (ref 10–35)
Albumin: 4.3 g/dL (ref 3.6–5.1)
Alkaline Phosphatase: 138 U/L — ABNORMAL HIGH (ref 33–130)
Bilirubin, Direct: 0.1 mg/dL (ref <=0.2)
Indirect Bilirubin: 0.3 mg/dL (ref 0.2–1.2)
Total Bilirubin: 0.4 mg/dL (ref 0.2–1.2)
Total Protein: 6.9 g/dL (ref 6.1–8.1)

## 2016-09-09 LAB — AMYLASE: Amylase: 61 U/L (ref 21–101)

## 2016-09-09 LAB — LIPASE: Lipase: 21 U/L (ref 7–60)

## 2016-09-09 MED ORDER — TERIPARATIDE (RECOMBINANT) 600 MCG/2.4ML ~~LOC~~ SOLN: 20.0000 ug | Freq: Every day | SUBCUTANEOUS | 5 refills | Status: DC

## 2016-09-09 NOTE — Patient Instructions (Signed)
Low-Fat Diet for Pancreatitis or Gallbladder Conditions A low-fat diet can be helpful if you have pancreatitis or a gallbladder condition. With these conditions, your pancreas and gallbladder have trouble digesting fats. A healthy eating plan with less fat will help rest your pancreas and gallbladder and reduce your symptoms. What do I need to know about this diet?  Eat a low-fat diet. ? Reduce your fat intake to less than 20-30% of your total daily calories. This is less than 50-60 g of fat per day. ? Remember that you need some fat in your diet. Ask your dietician what your daily goal should be. ? Choose nonfat and low-fat healthy foods. Look for the words "nonfat," "low fat," or "fat free." ? As a guide, look on the label and choose foods with less than 3 g of fat per serving. Eat only one serving.  Avoid alcohol.  Do not smoke. If you need help quitting, talk with your health care provider.  Eat small frequent meals instead of three large heavy meals. What foods can I eat? Grains Include healthy grains and starches such as potatoes, wheat bread, fiber-rich cereal, and brown rice. Choose whole grain options whenever possible. In adults, whole grains should account for 45-65% of your daily calories. Fruits and Vegetables Eat plenty of fruits and vegetables. Fresh fruits and vegetables add fiber to your diet. Meats and Other Protein Sources Eat lean meat such as chicken and pork. Trim any fat off of meat before cooking it. Eggs, fish, and beans are other sources of protein. In adults, these foods should account for 10-35% of your daily calories. Dairy Choose low-fat milk and dairy options. Dairy includes fat and protein, as well as calcium. Fats and Oils Limit high-fat foods such as fried foods, sweets, baked goods, sugary drinks. Other Creamy sauces and condiments, such as mayonnaise, can add extra fat. Think about whether or not you need to use them, or use smaller amounts or low fat  options. What foods are not recommended?  High fat foods, such as: ? Baked goods. ? Ice cream. ? French toast. ? Sweet rolls. ? Pizza. ? Cheese bread. ? Foods covered with batter, butter, creamy sauces, or cheese. ? Fried foods. ? Sugary drinks and desserts.  Foods that cause gas or bloating This information is not intended to replace advice given to you by your health care provider. Make sure you discuss any questions you have with your health care provider. Document Released: 03/26/2013 Document Revised: 08/27/2015 Document Reviewed: 03/04/2013 Elsevier Interactive Patient Education  2017 [employer]  

## 2016-09-09 NOTE — Progress Notes (Signed)
Location:  [facility] clinic Provider:  Verne Cove L. Mariea Clonts, D.O., C.M.D.  Code Status: DNR Goals of Care:  Advanced Directives 09/09/2016  Does Patient Have a Medical Advance Directive? Yes  Type of Advance Directive [place]  Does patient want to make changes to medical advance directive? -  Copy of [place] in Chart? Yes   Chief Complaint  Patient presents with  . Medical Management of Chronic Issues    64mth follow-up    HPI: Patient is a 76 y.o. female seen today for medical management of chronic diseases--to f/u on her weight and blood pressure.    BP today in normal range, also, was only high at [facility].  No changes needed.    Weight loss:  She has lost another lb in the last 3 months.  Reviewed last time:  Had the fibroglandular tissue on her mammo last year and required [place] which was then clear with 6 mo f/u recommended which she does not want to pursue.  She would not want chemo but she might consider a surgery if it was needed.  Her first husband had lymphoma and she saw him go through chemo.  She does not want more mammograms. No changes in bowel habits.  Had normal cscope (except diverticulosis) in 2015 w/o f/u needs.  Has hemorrhoids she's had forever.  No new aches or pains.  She lost her husband but had returned to regular eating in March already.    About a month ago, she had a gallbladder attack.  Last one was 8-9 years ago.  Knows she has gallstones.  That has made her more concerned about not eating fatty foods.  She had been up to 104 on her scale but only 102 now.  She eats a mediterranean diet now.  She has gone to eating 5 meals per day.  This episode lasted 8pm until 2am in her RUQ and went into her right shoulder.  Very uncomfortable and worse than child birth.  There were three people staying with her.  They were sleeping.  Fortunately, it went away.    Had a fall when she stepped back down when changed her mind about going up the steps.   Painful on bottom when bending or going up or down.    She did take forteo for 18 mos for osteoporosis in the past.  Does a lot of walking.  Had been doing weights and gardening also.  On vitamin D therapy.    Past Medical History:  Diagnosis Date  . Cellulitis, toe   . Cholelithiasis   . Diverticulitis   . GERD (gastroesophageal reflux disease)   . Hiatal hernia   . Hyperlipidemia   . Mitral valve prolapse   . 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 Osteoporosis     Past Surgical History:  Procedure Laterality Date  . BREAST BIOPSY     Left,negative   . COLONOSCOPY  05/2003   No polyps, hemorrhoids,and diverticulosis     No Known Allergies  Allergies as of 09/09/2016   No Known Allergies     Medication List       Accurate as of 09/09/16 10:07 AM. Always use your most recent med list.          Vitamin D3 2000 units capsule Take 1 capsule (2,000 Units total) by mouth daily.       Review of Systems:  Review of Systems  Constitutional: Positive for weight loss. Negative  for chills, fever and malaise/fatigue.  HENT: Negative for congestion and hearing loss.   Eyes:       Glasses with prisms  Respiratory: Negative for cough and shortness of breath.   Cardiovascular: Negative for chest pain, palpitations and leg swelling.  Gastrointestinal: Positive for abdominal pain and nausea. Negative for blood in stool, constipation, diarrhea, heartburn, melena and vomiting.       Around to back and right shoulder  Genitourinary: Negative for dysuria, frequency and urgency.  Musculoskeletal: Positive for falls.       Buttock pain  Skin: Negative for itching and rash.  Neurological: Negative for dizziness, loss of consciousness and weakness.  Psychiatric/Behavioral: Negative for depression and memory loss. The patient is not nervous/anxious and does not have insomnia.        Appropriate grief over loss of husband    Health Maintenance  Topic Date Due  . INFLUENZA VACCINE   11/02/2016  . MAMMOGRAM  09/02/2017  . TETANUS/TDAP  12/15/2023  . DEXA SCAN  Completed  . PNA vac Low Risk Adult  Completed    Physical Exam: Vitals:   09/09/16 1003  BP: 128/80  Pulse: 78  Temp: 98.2 F (36.8 C)  TempSrc: Oral  SpO2: 98%  Weight: 105 lb (47.6 kg)   Body mass index is 17.47 kg/m. Physical Exam  Constitutional: She is oriented to person, place, and time. No distress.  Thin white female   HENT:  Head: Normocephalic and atraumatic.  Eyes:  glasses  Neck: Neck supple. No thyromegaly present.  Cardiovascular: Normal rate, regular rhythm, normal heart sounds and intact distal pulses.   Pulmonary/Chest: Effort normal and breath sounds normal. No respiratory distress.  Abdominal: Soft. Bowel sounds are normal. She exhibits no distension and no mass. There is no tenderness. There is no rebound and no guarding. No hernia.  Musculoskeletal: Normal range of motion. She exhibits tenderness.  Tender over sacrum s/p fall  Lymphadenopathy:    She has no cervical adenopathy.  Neurological: She is alert and oriented to person, place, and time.  Skin: Skin is warm and dry. There is pallor.  Psychiatric: She has a normal mood and affect.    Labs reviewed: Basic Metabolic Panel:  Recent Labs  06/06/16 0822  NA 141  K 4.5  CL 105  CO2 29  GLUCOSE 99  BUN 15  CREATININE 0.63  CALCIUM 9.8  TSH 1.66   Liver Function Tests:  Recent Labs  06/06/16 0822  AST 16  ALT 12  ALKPHOS 78  BILITOT 0.6  PROT 6.6  ALBUMIN 4.2   No results for input(s): LIPASE, AMYLASE in the last 8760 hours. No results for input(s): AMMONIA in the last 8760 hours. CBC:  Recent Labs  06/06/16 0822  WBC 6.6  NEUTROABS 4,290  HGB 13.4  HCT 41.5  MCV 91.8  PLT 289   Lipid Panel:  Recent Labs  06/06/16 0822  CHOL 186  HDL 75  LDLCALC 92  TRIG 95  CHOLHDL 2.5   No results found for: HGBA1C  Procedures since last visit: No results found.  Assessment/Plan 1.  Right upper quadrant pain - has h/o known gallstones, had recent "gallbladder attack" and has had persistent poor appetite, anorexia, weight loss, despite attempts to gain weight -we've done screening tests to r/o any malignancy and these have been unremarkable--she's even changed from her vegetarian diet to a more expanded diet with some more protein w/o weight gain - suspect her symptoms are due to  her gall stones and she needs a cholecystectomy - Hepatic function panel - US Abdomen Limited RUQ; Future  2. Weight loss - see #1 - Hepatic function panel - Amylase - Lipase - US Abdomen Limited RUQ; Future - Amb ref to Medical Nutrition Therapy-MNT  3. Senile osteoporosis - cont vitamin D, weightbearing exercise and resume forteo injections ideally for just 6 more mos (did for 18 mos before and 2 years is max due to rat studies about osteosarcoma) - Teriparatide, Recombinant, 600 MCG/2.4ML SOLN; Inject 0.08 mLs (20 mcg total) into the skin daily.  Dispense: 2.4 mL; Refill: 5  4. Calculus of gallbladder without cholecystitis without obstruction -f/u US and consider surgery referral depending on results -not having active pain at time of appt, but did have recent episode, keeps losing weight and has to really limit her diet of fat so she cannot gain or even maintain her weight despite adding more protein  Labs/tests ordered:  Orders Placed This Encounter  Procedures  . US Abdomen Limited RUQ    EPIC ORDER WT-105/NO NEEDS INS-[facility],PT WITH FAXED ORDER    Standing Status:   Future    Number of Occurrences:   1    Standing Expiration Date:   11/09/2017    Order Specific Question:   Reason for Exam (SYMPTOM  OR DIAGNOSIS REQUIRED)    Answer:   RUQ pain to right shoulder, h/o known cholelithiasis, weight loss    Order Specific Question:   Preferred imaging location?    Answer:   [facility]  . Hepatic function panel  . Amylase  . Lipase  . Amb ref to Medical Nutrition  Therapy-MNT    Referral Priority:   Routine    Referral Type:   Consultation    Referral Reason:   Specialty Services Required    Requested Specialty:   Nutrition    Number of Visits Requested:   1    Next appt:  10/20/2016   Jamielynn Wigley L. Lindberg Zenon, D.O. [employer] [address], [place] [address] Cell Phone (Mon-Fri 8am-5pm):  [phone] On Call:  [phone] & follow prompts after 5pm & weekends Office Phone:  [phone] Office Fax:  [phone]

## 2016-09-13 ENCOUNTER — Telehealth: Payer: Self-pay | Admitting: *Deleted

## 2016-09-13 NOTE — Telephone Encounter (Signed)
Patient called and stated that a Rx was given at last appointment for [facility] and the Pharmacy has to special order the medication. Patient doesn't want to do this and wants another option. She stated that it was discussed at last visit regarding different options. Stated she can wait till next appointment if you prefer.  Please Advise.   (Reviewed last OV dated 09/09/16 and note not completed, no assessment/plan)

## 2016-09-13 NOTE — Telephone Encounter (Signed)
We can alternatively try her on prolia therapy which is the twice a year injection.  We'll need to fill in the form to do that authorization.

## 2016-09-14 NOTE — Telephone Encounter (Signed)
Patient notified and agreed. Filled out form for Prolia and given to [facility].

## 2016-09-15 ENCOUNTER — Ambulatory Visit
Admission: RE | Admit: 2016-09-15 | Discharge: 2016-09-15 | Disposition: A | Discharge status: Home / Self Care | Payer: Medicare Other | Source: Ambulatory Visit | Attending: Internal Medicine | Admitting: Internal Medicine

## 2016-09-15 DIAGNOSIS — R634 Abnormal weight loss: Secondary | ICD-10-CM

## 2016-09-15 DIAGNOSIS — R1011 Right upper quadrant pain: Secondary | ICD-10-CM

## 2016-09-16 ENCOUNTER — Other Ambulatory Visit: Payer: Self-pay | Admitting: Internal Medicine

## 2016-09-16 DIAGNOSIS — K8020 Calculus of gallbladder without cholecystitis without obstruction: Secondary | ICD-10-CM

## 2016-09-16 LAB — GAMMA GT: GGT: 36 U/L (ref 3–65)

## 2016-09-19 ENCOUNTER — Encounter: Payer: Medicare Other | Attending: Internal Medicine | Admitting: Registered"

## 2016-09-19 ENCOUNTER — Encounter: Payer: Self-pay | Admitting: Registered"

## 2016-09-19 DIAGNOSIS — R634 Abnormal weight loss: Secondary | ICD-10-CM | POA: Diagnosis not present

## 2016-09-19 DIAGNOSIS — Z681 Body mass index (BMI) 19 or less, adult: Secondary | ICD-10-CM | POA: Insufficient documentation

## 2016-09-19 DIAGNOSIS — Z713 Dietary counseling and surveillance: Secondary | ICD-10-CM | POA: Diagnosis present

## 2016-09-19 NOTE — Progress Notes (Signed)
Medical Nutrition Therapy:  Appt start time: 1120 end time:  1220.   Assessment:  Primary concerns today: Pt referred due to weight loss. Pt says she has always been interested in nutrition and feels like she has good nutrition knowledge. Pt says her husband had heart disease and they were mainly vegan before but she has incorporated eggs and some meats over the last few years. Pt says she has gradually lost weight over the last few years, especially after her husband passed away in 04-30-23. Pt says she got down to 101 lb after her husband passed away. Pt says a good/typical weight for her was/is ~110-115 lb. Pt says prior to the loss of her husband she had some gradual weight loss over the years but it was much more rapid following his passing. Pt says she recently had an ultrasound which revealed gallstones and she is going to meet with a surgeon to discuss treatment options. Pt says she had a gallbladder attack about a month ago and has been concerned about how to gain weight without eating high fat foods that will exacerbate her gallbladder. Pt says she had went out to eat more frequently right before she had the last gallbladder attack around a month ago.   Preferred Learning Style:  No preference indicated   Learning Readiness:   Ready  Weight Trends:  09/19/16 103 lb 9 oz 09/09/16: 105 lb 06/06/16: 106 lb   06/08/15: 112 lb   MEDICATIONS: See list.   DIETARY INTAKE:  Usual eating pattern includes 3 meals and 1-2 snacks per day.  Everyday foods include bananas, apples, strawberries, fruit smoothies, soy or almond milk.  Avoided foods include red meat, lunch meat, ham, pork.    24-hr recall:  B ( AM): (2 eggs) omelet with onions, mushrooms, tomatoes, asparagus and soy sausage with some cheese Snk ( AM): None reported.  L ( PM): Tofu hoagie sandwich Snk ( PM): Vegan ice cream (low sugar)  D ( PM): yogurt with strawberries Snk ( PM): None reported.  Beverages: 2 cups coffee  black  Pt says she usually drinks a glass of soy milk twice per day and that she typically eats a little more than she did yesterday.   Pt says she eats a lot of soup with carrots, onions, celery, tomatoes, and soy sausage.      Usual physical activity: Walks ~4 miles 1 day per week; gardening   Progress Towards Goal(s):  In progress.   Nutritional Diagnosis:  [place]-3.2 Unintentional weight loss As related to low fat diet for gallbladder and emotional distress.  As evidenced by weight loss of ~9 lb in past 15 months, loss of 1.5 lb in past 10 days; pt reported diet recall and pt reported weight loss following loss of husband .    Intervention:  Nutrition counseling provided. Dietitian educated pt on gallbladder nutrition therapy. Dietitian provided education about the relationship between the gallbladder and dietary fat. Dietitian discussed ways to increase calories in the diet without adding in extra fat. Dietitian encouraged pt to add additional foods- fruits, grains, starches, jams, etc to dishes she eats to increase calories. Dietitian recommended pt try Boost original to increase calories, as Boost contains 240 kcal per bottle and only 4 g fat. Pt can start by drinking 1/2 Boost with a meal or snack and work up to drinking 2 bottles per day to add 480 kcal/day. Dietitian recommended pt drink soy milk over almond as it contains more protein. Pt appeared agreeable  to information/goals discussion.   Goals:  Try to eat 5-6 small meals per day and a snack in between as needed. Eating several small meals/snacks during the day can help prevent gallbladder attacks and can help you get in more calories throughout the day.   With meals/snacks-try to boost calories through adding in fruits, starches, grains, jams, etc-foods that can add calories without adding a lot of fat.   Try Boost original supplement drinks. They provide 240 kcal per 8 oz bottle. Can start with drinking 1/2 bottle with a snack/meal  twice a day and move to drinks 2 bottles per day.    Make sure to stay hydrated by getting enough liquids.   Teaching Method Utilized:  Auditory  Handouts given during visit include:  Gallbladder Nutrition Therapy  Barriers to learning/adherence to lifestyle change: None indicated.   Demonstrated degree of understanding via:  Teach Back   Monitoring/Evaluation:  Dietary intake, exercise, and body weight prn.

## 2016-09-19 NOTE — Patient Instructions (Addendum)
Try to eat 5-6 small meals per day and a snack in between as needed. Eating several small meals/snacks during the day can help prevent gallbladder attacks and can help you get in more calories throughout the day.   With meals/snacks-try to boost calories through adding in fruits, starches, grains, jams, etc-foods that can add calories without adding a lot of fat.   Try Boost original supplement drinks. They provide 240 kcal per 8 oz bottle. Can start with drinking 1/2 bottle with a snack/meal twice a day and move to drinks 2 bottles per day.    Make sure to stay hydrated by getting enough liquids.

## 2016-09-27 ENCOUNTER — Ambulatory Visit (INDEPENDENT_AMBULATORY_CARE_PROVIDER_SITE_OTHER): Payer: Medicare Other

## 2016-09-27 DIAGNOSIS — M810 Age-related osteoporosis without current pathological fracture: Secondary | ICD-10-CM

## 2016-09-27 MED ORDER — DENOSUMAB 60 MG/ML ~~LOC~~ SOLN
60.0000 mg | Freq: Once | SUBCUTANEOUS | Status: AC
  Administered 2016-09-27: 60 mg via SUBCUTANEOUS

## 2016-10-12 ENCOUNTER — Ambulatory Visit: Payer: Self-pay | Admitting: Surgery

## 2016-10-17 ENCOUNTER — Encounter (HOSPITAL_COMMUNITY): Payer: Self-pay | Admitting: Surgery

## 2016-10-17 NOTE — H&P (Signed)
[facility]  Adriana [name] 10/12/2016 11:08 AM Location: [facility] Patient #: [id number] DOB: January 24, 1941 Widowed / Language: Adriana [name] / Race: White Female   History of Present Illness [name] MD; 10/12/2016 11:48 AM) The patient is a 76 year old female who presents for evaluation of gall stones.  CC: symptomatic cholelithiasis, chronic cholecystitis  Patient is referred by Dr. Hollace Kinnier for evaluation and management of symptomatic cholelithiasis and probable chronic cholecystitis. Patient was initially diagnosed with cholelithiasis approximately 12 years ago. She had a significant episode of biliary colic with right upper quadrant abdominal pain radiating to the right shoulder. Patient altered her diet and became vegetarian. She has had limited episodes until approximately 1 month ago. Patient had had significant weight loss after the death of her husband. She had lost approximately 15-20 pounds to a low of 101 pounds. Patient was no longer eating her vegetarian diet and experienced an episode of biliary colic which was quite severe, lasting for approximately 5 hours, with right upper quadrant abdominal pain and radiation to the shoulder. Patient denies any jaundice or acholic stools. She denies any fevers or chills. She has had no prior abdominal surgery. She has noted a right inguinal hernia which is largely asymptomatic. Patient does have a history of hiatal hernia. Patient underwent ultrasound on September 15, 2016. This showed multiple small layering gallstones without signs of acute cholecystitis. There is no biliary dilatation. Patient presents today to discuss cholecystectomy.   Past Surgical History ([name], [place]; 10/12/2016 11:08 AM) No pertinent past surgical history   Diagnostic Studies History ([name], [place]; 10/12/2016 11:08 AM) Colonoscopy  5-10 years ago Mammogram  within last year Pap Smear   >5 years ago  Allergies ([name], [place]; 10/12/2016 11:09 AM) No Known Drug Allergies 10/12/2016 Allergies Reconciled   Medication History ([name], RMA; 10/12/2016 11:10 AM) Cholecalciferol (1000UNIT Capsule, Oral) Active. Teriparatide (Recombinant) (600MCG/2.4ML Solution, Subcutaneous) Active. Medications Reconciled  Social History ([name], [place]; 10/12/2016 11:08 AM) Alcohol use  Occasional alcohol use. Caffeine use  Coffee, Tea. No drug use  Tobacco use  Never smoker.  Family History ([name], [place]; 10/12/2016 11:08 AM) Breast Cancer  Sister. Cerebrovascular Accident  Mother. Hypertension  Mother. Respiratory Condition  Father.  Pregnancy / Birth History ([name], [place]; 10/12/2016 11:08 AM) Age at menarche  22 years. Age of menopause  [age] Contraceptive History  Oral contraceptives. Gravida  2 Length (months) of breastfeeding  3-6 Maternal age  34-25 Para  2  Other Problems ([name], [place]; 10/12/2016 11:08 AM) Cholelithiasis  Heart murmur  Hemorrhoids  Inguinal Hernia     Review of Systems ([name] RMA; 10/12/2016 11:08 AM) General Present- Weight Loss. Not Present- Appetite Loss, Chills, Fatigue, Fever, Night Sweats and Weight Gain. Skin Not Present- Change in Wart/Mole, Dryness, Hives, Jaundice, New Lesions, Non-Healing Wounds, Rash and Ulcer. HEENT Present- Visual Disturbances and Wears glasses/contact lenses. Not Present- Earache, Hearing Loss, Hoarseness, Nose Bleed, Oral Ulcers, Ringing in the Ears, Seasonal Allergies, Sinus Pain, Sore Throat and Yellow Eyes. Respiratory Not Present- Bloody sputum, Chronic Cough, Difficulty Breathing, Snoring and Wheezing. Breast Not Present- Breast Mass, Breast Pain, Nipple Discharge and Skin Changes. Cardiovascular Not Present- Chest Pain, Difficulty Breathing Lying Down, Leg Cramps, Palpitations, Rapid Heart Rate, Shortness of Breath and Swelling of  Extremities. Gastrointestinal Present- Hemorrhoids. Not Present- Abdominal Pain, Bloating, Bloody Stool, Change in Bowel Habits, Chronic  diarrhea, Constipation, Difficulty Swallowing, Excessive gas, Gets full quickly at meals, Indigestion, Nausea, Rectal Pain and Vomiting. Female Genitourinary Not Present- Frequency, Nocturia, Painful Urination, Pelvic Pain and Urgency. Musculoskeletal Not Present- Back Pain, Joint Pain, Joint Stiffness, Muscle Pain, Muscle Weakness and Swelling of Extremities. Neurological Not Present- Decreased Memory, Fainting, Headaches, Numbness, Seizures, Tingling, Tremor, Trouble walking and Weakness. Psychiatric Not Present- Anxiety, Bipolar, Change in Sleep Pattern, Depression, Fearful and Frequent crying. Endocrine Not Present- Cold Intolerance, Excessive Hunger, Hair Changes, Heat Intolerance, Hot flashes and New Diabetes. Hematology Not Present- Blood Thinners, Easy Bruising, Excessive bleeding, Gland problems, HIV and Persistent Infections.  Vitals ([name] RMA; 10/12/2016 11:09 AM) 10/12/2016 11:09 AM Weight: 109.2 lb Height: 65in Body Surface Area: 1.53 m Body Mass Index: 18.17 kg/m  Temp.: 97.39F  Pulse: 99 (Regular)  P.OX: 99% (Room air) BP: 138/84 (Sitting, Left Arm, Standard)       Physical Exam [name] MD; 10/12/2016 11:49 AM) The physical exam findings are as follows: Note:CONSTITUTIONAL See vital signs recorded above  GENERAL APPEARANCE Development: normal Nutritional status: normal Gross deformities: none  SKIN Rash, lesions, ulcers: none Induration, erythema: none Nodules: none palpable  EYES Conjunctiva and lids: normal Pupils: equal and reactive Iris: normal bilaterally  EARS, NOSE, MOUTH, THROAT External ears: no lesion or deformity External nose: no lesion or deformity Hearing: grossly normal Lips: no lesion or deformity Dentition: normal for age Oral mucosa: moist  NECK Symmetric:  yes Trachea: midline Thyroid: no palpable nodules in the thyroid bed  CHEST Respiratory effort: normal Retraction or accessory muscle use: no Breath sounds: normal bilaterally Rales, rhonchi, wheeze: none  CARDIOVASCULAR Auscultation: regular rhythm, normal rate Murmurs: none Pulses: carotid and radial pulse 2+ palpable Lower extremity edema: none Lower extremity varicosities: none  ABDOMEN Distension: none Masses: none palpable Tenderness: none Hepatosplenomegaly: not present Hernia: not present  GENITOURINARY Small, reducible right inguinal hernia, nontender. No evidence of left inguinal hernia  MUSCULOSKELETAL Station and gait: normal Digits and nails: no clubbing or cyanosis Muscle strength: grossly normal all extremities Range of motion: grossly normal all extremities Deformity: none  LYMPHATIC Cervical: none palpable Supraclavicular: none palpable  PSYCHIATRIC Oriented to person, place, and time: yes Mood and affect: normal for situation Judgment and insight: appropriate for situation    Assessment & Plan [name] MD; 10/12/2016 11:52 AM) CHOLECYSTITIS WITH CHOLELITHIASIS (K80.10) Current Plans Patient presents on referral from her primary care physician for evaluation of symptomatic cholelithiasis and probable cholecystitis. Patient is provided with written literature on gallbladder surgery to review at home.  Patient has a 12 year history of cholelithiasis. She has had 2 significant episodes of biliary colic. Ultrasound demonstrates multiple small layering gallstones.  We discussed laparoscopic cholecystectomy. We discussed intraoperative cholangiography. We discussed potential complications from cholelithiasis. We discussed the surgical procedure, the hospital stay, and the postoperative recovery. Patient is provided with written literature to review at home. Patient would like to proceed with cholecystectomy in the near future. We will  schedule this with an overnight hospital stay.  The risks and benefits of the procedure have been discussed at length with the patient. The patient understands the proposed procedure, potential alternative treatments, and the course of recovery to be expected. All of the patient's questions have been answered at this time. The patient wishes to proceed with surgery.  [name], MD, [facility] Office: [phone]

## 2016-10-20 ENCOUNTER — Encounter: Payer: Self-pay | Admitting: Internal Medicine

## 2016-10-20 ENCOUNTER — Ambulatory Visit (INDEPENDENT_AMBULATORY_CARE_PROVIDER_SITE_OTHER): Payer: Medicare Other | Admitting: Internal Medicine

## 2016-10-20 VITALS — BP 110/60 | HR 79 | Temp 97.8°F | Wt 110.0 lb

## 2016-10-20 DIAGNOSIS — M810 Age-related osteoporosis without current pathological fracture: Secondary | ICD-10-CM

## 2016-10-20 DIAGNOSIS — K811 Chronic cholecystitis: Secondary | ICD-10-CM

## 2016-10-20 DIAGNOSIS — R634 Abnormal weight loss: Secondary | ICD-10-CM | POA: Diagnosis not present

## 2016-10-20 NOTE — Progress Notes (Signed)
Location:  [facility] clinic Provider:  Myrtha Tonkovich L. Mariea Clonts, D.O., C.M.D.  Code Status: DNR--we reviewed this today--she is very clear about it. Goals of Care:  Advanced Directives 09/19/2016  Does Patient Have a Medical Advance Directive? Yes  Type of Advance Directive -  Does patient want to make changes to medical advance directive? -  Copy of [place] in Chart? -   Chief Complaint  Patient presents with  . Follow-up    45mth follow-up on ultrasound    HPI: Patient is a 76 y.o. female seen today for f/u on [place] of gallbladder done due to weight loss, anorexia, h/o gallstones.  She did have multiple gallstones and had been losing weight precipitously. She is now planning to have a lap cholecystectomy on 8/1. She's been eating a lot and gaining weight now.  She gained 6 lbs since her last visit.  Nutritionist said to take 2 bottles of boost per day also.    She is going to do her will over and will be putting one of her sons as health care POA since her husband passed away.  She plans to bring [place] a copy.  She took the prolia and then read the package insert about side effects which did not thrill her but she's had none of them.  Past Medical History:  Diagnosis Date  . Cellulitis, toe   . Cholelithiasis   . Diverticulitis   . GERD (gastroesophageal reflux disease)   . Hiatal hernia   . Hyperlipidemia   . Mitral valve prolapse   . 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 Osteoporosis     Past Surgical History:  Procedure Laterality Date  . BREAST BIOPSY     Left,negative   . COLONOSCOPY  05/2003   No polyps, hemorrhoids,and diverticulosis     No Known Allergies  Allergies as of 10/20/2016   No Known Allergies     Medication List       Accurate as of 10/20/16  7:46 AM. Always use your most recent med list.          Teriparatide (Recombinant) 600 MCG/2.4ML Soln Inject 0.08 mLs (20 mcg total) into the skin daily.   Vitamin D-3 1000 units Caps Take  1,000 Units by mouth.      Review of Systems:  Review of Systems  Constitutional: Negative for chills, fever, malaise/fatigue and weight loss.       Has now gained weight since taking boost and eating more liberally after nutritionist  HENT: Negative for congestion and hearing loss.   Eyes: Positive for double vision. Negative for blurred vision.  Respiratory: Negative for cough and shortness of breath.   Cardiovascular: Negative for chest pain, palpitations and leg swelling.  Gastrointestinal: Negative for abdominal pain, blood in stool, constipation, diarrhea, heartburn, melena, nausea and vomiting.  Genitourinary: Negative for dysuria.  Musculoskeletal: Negative for falls and joint pain.       No falls since last appt  Skin: Negative for itching and rash.  Neurological: Negative for dizziness, loss of consciousness and weakness.  Endo/Heme/Allergies: Does not bruise/bleed easily.  Psychiatric/Behavioral: Negative for depression and memory loss. The patient is not nervous/anxious and does not have insomnia.     Health Maintenance  Topic Date Due  . INFLUENZA VACCINE  11/02/2016  . MAMMOGRAM  09/02/2017  . TETANUS/TDAP  12/15/2023  . DEXA SCAN  Completed  . PNA vac Low Risk Adult  Completed    Physical Exam: Vitals:  10/20/16 0743  BP: 110/60  Pulse: 79  Temp: 97.8 F (36.6 C)  TempSrc: Oral  SpO2: 96%  Weight: 110 lb (49.9 kg)   Body mass index is 18.3 kg/m. Physical Exam  Constitutional: She is oriented to person, place, and time. She appears well-developed. No distress.  HENT:  Head: Normocephalic and atraumatic.  Cardiovascular: Normal rate, regular rhythm, normal heart sounds and intact distal pulses.   Midsystolic click  Pulmonary/Chest: Effort normal and breath sounds normal. No respiratory distress.  Abdominal: Soft. Bowel sounds are normal. She exhibits no distension. There is no tenderness.  Musculoskeletal: Normal range of motion.  Neurological: She  is alert and oriented to person, place, and time.  Skin: Skin is warm and dry. Capillary refill takes less than 2 seconds.  Psychiatric: She has a normal mood and affect.    Labs reviewed: Basic Metabolic Panel:  Recent Labs  06/06/16 0822  NA 141  K 4.5  CL 105  CO2 29  GLUCOSE 99  BUN 15  CREATININE 0.63  CALCIUM 9.8  TSH 1.66   Liver Function Tests:  Recent Labs  06/06/16 0822 09/09/16 1035  AST 16 14  ALT 12 13  ALKPHOS 78 138*  BILITOT 0.6 0.4  PROT 6.6 6.9  ALBUMIN 4.2 4.3    Recent Labs  09/09/16 1035  LIPASE 21  AMYLASE 61   No results for input(s): AMMONIA in the last 8760 hours. CBC:  Recent Labs  06/06/16 0822  WBC 6.6  NEUTROABS 4,290  HGB 13.4  HCT 41.5  MCV 91.8  PLT 289   Lipid Panel:  Recent Labs  06/06/16 0822  CHOL 186  HDL 75  LDLCALC 92  TRIG 95  CHOLHDL 2.5   Assessment/Plan 1. Cholecystitis, chronic -ongoing, is about to have lap chole with Dr. Harlow Asa 11/02/16  2. Weight loss -has now gained 6 lbs in 1 month since liberalizing her diet and beginning to take boost supplements  3. Senile osteoporosis -continue prolia, weightbearing exercise and vitamin D 1000 units daily  Labs/tests ordered:  No new Next appt:  02/09/2017 to f/u postop and ensure weight staying stable and not trending down  Yan Okray L. Ithzel Fedorchak, D.O. [facility] [address], [place] [address] Cell Phone (Mon-Fri 8am-5pm):  [phone] On Call:  [phone] & follow prompts after 5pm & weekends Office Phone:  [phone] Office Fax:  [phone]

## 2016-10-21 NOTE — Patient Instructions (Signed)
Adriana [name]  10/21/2016   Your procedure is scheduled on:   Report to [place]  elevators to 3rd floor to  [place] at     [phone].    Call this number if you have problems the morning of surgery [phone]    Remember: ONLY 1 PERSON MAY GO WITH YOU TO SHORT STAY TO GET  READY MORNING OF [place].  Do not eat food or drink liquids :After Midnight.     Take these medicines the morning of surgery with A SIP OF WATER: none                                You may not have any metal on your body including hair pins and              piercings  Do not wear jewelry, make-up, lotions, powders or perfumes, deodorant             Do not wear nail polish.  Do not shave  48 hours prior to surgery.                Do not bring valuables to the hospital. [place].  Contacts, dentures or bridgework may not be worn into surgery.  Leave suitcase in the car. After surgery it may be brought to your room.                  Please read over the following fact sheets you were given: _____________________________________________________________________            [facility] - Preparing for Surgery Before surgery, you can play an important role.  Because skin is not sterile, your skin needs to be as free of germs as possible.  You can reduce the number of germs on your skin by washing with CHG (chlorahexidine gluconate) soap before surgery.  CHG is an antiseptic cleaner which kills germs and bonds with the skin to continue killing germs even after washing. Please DO NOT use if you have an allergy to CHG or antibacterial soaps.  If your skin becomes reddened/irritated stop using the CHG and inform your nurse when you arrive at Short Stay. Do not shave (including legs and underarms) for at least 48 hours prior to the first CHG shower.  You may shave your face/neck. Please follow these  instructions carefully:  1.  Shower with CHG Soap the night before surgery and the  morning of Surgery.  2.  If you choose to wash your hair, wash your hair first as usual with your  normal  shampoo.  3.  After you shampoo, rinse your hair and body thoroughly to remove the  shampoo.                           4.  Use CHG as you would any other liquid soap.  You can apply chg directly  to the skin and wash                       Gently with a scrungie or clean washcloth.  5.  Apply the CHG Soap to  your body ONLY FROM THE NECK DOWN.   Do not use on face/ open                           Wound or open sores. Avoid contact with eyes, ears mouth and genitals (private parts).                       Wash face,  Genitals (private parts) with your normal soap.             6.  Wash thoroughly, paying special attention to the area where your surgery  will be performed.  7.  Thoroughly rinse your body with warm water from the neck down.  8.  DO NOT shower/wash with your normal soap after using and rinsing off  the CHG Soap.                9.  Pat yourself dry with a clean towel.            10.  Wear clean pajamas.            11.  Place clean sheets on your bed the night of your first shower and do not  sleep with pets. Day of Surgery : Do not apply any lotions/deodorants the morning of surgery.  Please wear clean clothes to the hospital/surgery center.  FAILURE TO FOLLOW THESE INSTRUCTIONS MAY RESULT IN THE CANCELLATION OF YOUR SURGERY PATIENT SIGNATURE_________________________________  NURSE SIGNATURE__________________________________  ________________________________________________________________________

## 2016-10-24 ENCOUNTER — Encounter (HOSPITAL_COMMUNITY): Payer: Self-pay

## 2016-10-24 ENCOUNTER — Encounter (HOSPITAL_COMMUNITY)
Admission: RE | Admit: 2016-10-24 | Discharge: 2016-10-24 | Disposition: A | Discharge status: Home / Self Care | Payer: Medicare Other | Source: Ambulatory Visit | Attending: Surgery | Admitting: Surgery

## 2016-10-24 DIAGNOSIS — Z01812 Encounter for preprocedural laboratory examination: Secondary | ICD-10-CM | POA: Insufficient documentation

## 2016-10-24 DIAGNOSIS — K8020 Calculus of gallbladder without cholecystitis without obstruction: Secondary | ICD-10-CM | POA: Insufficient documentation

## 2016-10-24 HISTORY — DX: Malignant (primary) neoplasm, unspecified: C80.1

## 2016-10-24 LAB — CBC
HEMATOCRIT: 38.9 % (ref 36.0–46.0)
HEMOGLOBIN: 12.7 g/dL (ref 12.0–15.0)
MCH: 30.8 pg (ref 26.0–34.0)
MCHC: 32.6 g/dL (ref 30.0–36.0)
MCV: 94.2 fL (ref 78.0–100.0)
PLATELETS: 265 10*3/uL (ref 150–400)
RBC: 4.13 MIL/uL (ref 3.87–5.11)
RDW: 14.5 % (ref 11.5–15.5)
WBC: 7.2 10*3/uL (ref 4.0–10.5)

## 2016-10-24 LAB — COMPREHENSIVE METABOLIC PANEL
ALBUMIN: 4.2 g/dL (ref 3.5–5.0)
ALT: 15 U/L (ref 14–54)
ANION GAP: 6 (ref 5–15)
AST: 21 U/L (ref 15–41)
Alkaline Phosphatase: 78 U/L (ref 38–126)
BILIRUBIN TOTAL: 0.6 mg/dL (ref 0.3–1.2)
BUN: 16 mg/dL (ref 6–20)
CHLORIDE: 106 mmol/L (ref 101–111)
CO2: 26 mmol/L (ref 22–32)
Calcium: 8.8 mg/dL — ABNORMAL LOW (ref 8.9–10.3)
Creatinine, Ser: 0.64 mg/dL (ref 0.44–1.00)
GFR calc Af Amer: >60 mL/min (ref >60)
GLUCOSE: 104 mg/dL — AB (ref 65–99)
POTASSIUM: 4.6 mmol/L (ref 3.5–5.1)
Sodium: 138 mmol/L (ref 135–145)
TOTAL PROTEIN: 6.9 g/dL (ref 6.5–8.1)

## 2016-11-01 ENCOUNTER — Encounter (HOSPITAL_COMMUNITY): Payer: Self-pay | Admitting: Surgery

## 2016-11-02 ENCOUNTER — Ambulatory Visit (HOSPITAL_COMMUNITY): Payer: Medicare Other

## 2016-11-02 ENCOUNTER — Observation Stay (HOSPITAL_COMMUNITY)
Admission: RE | Admit: 2016-11-02 | Discharge: 2016-11-04 | Disposition: A | Discharge status: Home / Self Care | Payer: Medicare Other | Source: Ambulatory Visit | Attending: Surgery | Admitting: Surgery

## 2016-11-02 ENCOUNTER — Encounter (HOSPITAL_COMMUNITY)
Admission: RE | Disposition: A | Discharge status: Home / Self Care | Payer: Self-pay | Source: Ambulatory Visit | Attending: Surgery

## 2016-11-02 ENCOUNTER — Ambulatory Visit (HOSPITAL_COMMUNITY): Payer: Medicare Other | Admitting: Anesthesiology

## 2016-11-02 ENCOUNTER — Encounter (HOSPITAL_COMMUNITY): Payer: Self-pay | Admitting: Anesthesiology

## 2016-11-02 DIAGNOSIS — M869 Osteomyelitis, unspecified: Secondary | ICD-10-CM | POA: Diagnosis not present

## 2016-11-02 DIAGNOSIS — Z79899 Other long term (current) drug therapy: Secondary | ICD-10-CM | POA: Insufficient documentation

## 2016-11-02 DIAGNOSIS — I341 Nonrheumatic mitral (valve) prolapse: Secondary | ICD-10-CM | POA: Insufficient documentation

## 2016-11-02 DIAGNOSIS — K828 Other specified diseases of gallbladder: Secondary | ICD-10-CM | POA: Insufficient documentation

## 2016-11-02 DIAGNOSIS — Z803 Family history of malignant neoplasm of breast: Secondary | ICD-10-CM | POA: Diagnosis not present

## 2016-11-02 DIAGNOSIS — K449 Diaphragmatic hernia without obstruction or gangrene: Secondary | ICD-10-CM | POA: Diagnosis not present

## 2016-11-02 DIAGNOSIS — K8010 Calculus of gallbladder with chronic cholecystitis without obstruction: Secondary | ICD-10-CM | POA: Diagnosis present

## 2016-11-02 DIAGNOSIS — Z85828 Personal history of other malignant neoplasm of skin: Secondary | ICD-10-CM | POA: Diagnosis not present

## 2016-11-02 DIAGNOSIS — M810 Age-related osteoporosis without current pathological fracture: Secondary | ICD-10-CM | POA: Diagnosis not present

## 2016-11-02 DIAGNOSIS — K8064 Calculus of gallbladder and bile duct with chronic cholecystitis without obstruction: Secondary | ICD-10-CM | POA: Diagnosis not present

## 2016-11-02 DIAGNOSIS — R110 Nausea: Secondary | ICD-10-CM | POA: Diagnosis not present

## 2016-11-02 DIAGNOSIS — K219 Gastro-esophageal reflux disease without esophagitis: Secondary | ICD-10-CM | POA: Diagnosis not present

## 2016-11-02 DIAGNOSIS — Z8249 Family history of ischemic heart disease and other diseases of the circulatory system: Secondary | ICD-10-CM | POA: Insufficient documentation

## 2016-11-02 DIAGNOSIS — E785 Hyperlipidemia, unspecified: Secondary | ICD-10-CM | POA: Insufficient documentation

## 2016-11-02 DIAGNOSIS — Z419 Encounter for procedure for purposes other than remedying health state, unspecified: Secondary | ICD-10-CM

## 2016-11-02 DIAGNOSIS — K4090 Unilateral inguinal hernia, without obstruction or gangrene, not specified as recurrent: Secondary | ICD-10-CM | POA: Diagnosis not present

## 2016-11-02 DIAGNOSIS — K8050 Calculus of bile duct without cholangitis or cholecystitis without obstruction: Secondary | ICD-10-CM

## 2016-11-02 DIAGNOSIS — K8020 Calculus of gallbladder without cholecystitis without obstruction: Secondary | ICD-10-CM | POA: Diagnosis present

## 2016-11-02 HISTORY — DX: Other specified postprocedural states: Z98.890

## 2016-11-02 HISTORY — DX: Other specified postprocedural states: R11.2

## 2016-11-02 HISTORY — PX: CHOLECYSTECTOMY: SHX55

## 2016-11-02 SURGERY — LAPAROSCOPIC CHOLECYSTECTOMY WITH INTRAOPERATIVE CHOLANGIOGRAM
Anesthesia: General

## 2016-11-02 MED ORDER — ONDANSETRON HCL 4 MG/2ML IJ SOLN
4.0000 mg | Freq: Four times a day (QID) | INTRAMUSCULAR | Status: DC | PRN
  Administered 2016-11-02: 4 mg via INTRAVENOUS

## 2016-11-02 MED ORDER — CEFAZOLIN SODIUM-DEXTROSE 2-4 GM/100ML-% IV SOLN
INTRAVENOUS | Status: AC
  Filled 2016-11-02: qty 100

## 2016-11-02 MED ORDER — DEXAMETHASONE SODIUM PHOSPHATE 10 MG/ML IJ SOLN
INTRAMUSCULAR | Status: AC
  Filled 2016-11-02: qty 1

## 2016-11-02 MED ORDER — CHLORHEXIDINE GLUCONATE CLOTH 2 % EX PADS: 6.0000 | MEDICATED_PAD | Freq: Once | CUTANEOUS | Status: DC

## 2016-11-02 MED ORDER — ROCURONIUM BROMIDE 10 MG/ML (PF) SYRINGE
PREFILLED_SYRINGE | INTRAVENOUS | Status: DC | PRN
Start: 2016-11-02 — End: 2016-11-02
  Administered 2016-11-02: 50 mg via INTRAVENOUS

## 2016-11-02 MED ORDER — LIDOCAINE 2% (20 MG/ML) 5 ML SYRINGE
INTRAMUSCULAR | Status: AC
  Filled 2016-11-02: qty 5

## 2016-11-02 MED ORDER — 0.9 % SODIUM CHLORIDE (POUR BTL) OPTIME
TOPICAL | Status: DC | PRN
  Administered 2016-11-02: 1000 mL

## 2016-11-02 MED ORDER — PROPOFOL 10 MG/ML IV BOLUS
INTRAVENOUS | Status: DC | PRN
  Administered 2016-11-02: 110 mg via INTRAVENOUS

## 2016-11-02 MED ORDER — SUCCINYLCHOLINE CHLORIDE 200 MG/10ML IV SOSY
PREFILLED_SYRINGE | INTRAVENOUS | Status: AC
  Filled 2016-11-02: qty 10

## 2016-11-02 MED ORDER — PHENYLEPHRINE 40 MCG/ML (10ML) SYRINGE FOR IV PUSH (FOR BLOOD PRESSURE SUPPORT): PREFILLED_SYRINGE | INTRAVENOUS | Status: DC | PRN

## 2016-11-02 MED ORDER — PROMETHAZINE HCL 25 MG/ML IJ SOLN: 6.2500 mg | INTRAMUSCULAR | Status: DC | PRN

## 2016-11-02 MED ORDER — SUGAMMADEX SODIUM 200 MG/2ML IV SOLN
INTRAVENOUS | Status: AC
  Filled 2016-11-02: qty 2

## 2016-11-02 MED ORDER — ONDANSETRON 4 MG PO TBDP: 4.0000 mg | ORAL_TABLET | Freq: Four times a day (QID) | ORAL | Status: DC | PRN

## 2016-11-02 MED ORDER — IOPAMIDOL (ISOVUE-300) INJECTION 61%
INTRAVENOUS | Status: AC
  Filled 2016-11-02: qty 50

## 2016-11-02 MED ORDER — SUGAMMADEX SODIUM 200 MG/2ML IV SOLN
INTRAVENOUS | Status: DC | PRN
  Administered 2016-11-02: 100 mg via INTRAVENOUS

## 2016-11-02 MED ORDER — IOPAMIDOL (ISOVUE-300) INJECTION 61%
INTRAVENOUS | Status: DC | PRN
  Administered 2016-11-02: 15 mL

## 2016-11-02 MED ORDER — ONDANSETRON HCL 4 MG/2ML IJ SOLN
INTRAMUSCULAR | Status: DC | PRN
  Administered 2016-11-02: 4 mg via INTRAVENOUS

## 2016-11-02 MED ORDER — ROCURONIUM BROMIDE 50 MG/5ML IV SOSY
PREFILLED_SYRINGE | INTRAVENOUS | Status: AC
  Filled 2016-11-02: qty 5

## 2016-11-02 MED ORDER — LIDOCAINE 2% (20 MG/ML) 5 ML SYRINGE
INTRAMUSCULAR | Status: DC | PRN
  Administered 2016-11-02: 50 mg via INTRAVENOUS

## 2016-11-02 MED ORDER — DEXAMETHASONE SODIUM PHOSPHATE 10 MG/ML IJ SOLN
INTRAMUSCULAR | Status: DC | PRN
  Administered 2016-11-02: 10 mg via INTRAVENOUS

## 2016-11-02 MED ORDER — ONDANSETRON HCL 4 MG/2ML IJ SOLN
INTRAMUSCULAR | Status: AC
  Filled 2016-11-02: qty 2

## 2016-11-02 MED ORDER — PROPOFOL 10 MG/ML IV BOLUS
INTRAVENOUS | Status: AC
  Filled 2016-11-02: qty 20

## 2016-11-02 MED ORDER — BUPIVACAINE-EPINEPHRINE 0.25% -1:200000 IJ SOLN
INTRAMUSCULAR | Status: AC
  Filled 2016-11-02: qty 1

## 2016-11-02 MED ORDER — HYDROMORPHONE HCL-NACL 0.5-0.9 MG/ML-% IV SOSY
PREFILLED_SYRINGE | INTRAVENOUS | Status: AC
  Administered 2016-11-02: 0.25 mg via INTRAVENOUS
  Filled 2016-11-02: qty 2

## 2016-11-02 MED ORDER — FENTANYL CITRATE (PF) 250 MCG/5ML IJ SOLN
INTRAMUSCULAR | Status: AC
  Filled 2016-11-02: qty 5

## 2016-11-02 MED ORDER — PHENYLEPHRINE 40 MCG/ML (10ML) SYRINGE FOR IV PUSH (FOR BLOOD PRESSURE SUPPORT)
PREFILLED_SYRINGE | INTRAVENOUS | Status: DC | PRN
  Administered 2016-11-02 (×3): 80 ug via INTRAVENOUS

## 2016-11-02 MED ORDER — BUPIVACAINE-EPINEPHRINE 0.5% -1:200000 IJ SOLN
INTRAMUSCULAR | Status: DC | PRN
  Administered 2016-11-02: 20 mL

## 2016-11-02 MED ORDER — FENTANYL CITRATE (PF) 100 MCG/2ML IJ SOLN
INTRAMUSCULAR | Status: DC | PRN
  Administered 2016-11-02: 100 ug via INTRAVENOUS
  Administered 2016-11-02: 25 ug via INTRAVENOUS

## 2016-11-02 MED ORDER — HYDROCODONE-ACETAMINOPHEN 5-325 MG PO TABS
1.0000 | ORAL_TABLET | ORAL | Status: DC | PRN
  Administered 2016-11-02: 1 via ORAL
  Filled 2016-11-02: qty 1

## 2016-11-02 MED ORDER — CEFAZOLIN SODIUM-DEXTROSE 2-4 GM/100ML-% IV SOLN
2.0000 g | INTRAVENOUS | Status: AC
  Administered 2016-11-02: 2 g via INTRAVENOUS

## 2016-11-02 MED ORDER — LACTATED RINGERS IR SOLN
Status: DC | PRN
  Administered 2016-11-02: 1000 mL

## 2016-11-02 MED ORDER — HYDROMORPHONE HCL-NACL 0.5-0.9 MG/ML-% IV SOSY
0.2500 mg | PREFILLED_SYRINGE | INTRAVENOUS | Status: DC | PRN
  Administered 2016-11-02 (×2): 0.25 mg via INTRAVENOUS

## 2016-11-02 MED ORDER — HYDROMORPHONE HCL-NACL 0.5-0.9 MG/ML-% IV SOSY
1.0000 mg | PREFILLED_SYRINGE | INTRAVENOUS | Status: DC | PRN
  Administered 2016-11-02: 1 mg via INTRAVENOUS
  Filled 2016-11-02: qty 2

## 2016-11-02 MED ORDER — ACETAMINOPHEN 650 MG RE SUPP: 650.0000 mg | Freq: Four times a day (QID) | RECTAL | Status: DC | PRN

## 2016-11-02 MED ORDER — ACETAMINOPHEN 325 MG PO TABS
650.0000 mg | ORAL_TABLET | Freq: Four times a day (QID) | ORAL | Status: DC | PRN
  Administered 2016-11-03: 650 mg via ORAL
  Filled 2016-11-02: qty 2

## 2016-11-02 MED ORDER — LACTATED RINGERS IV SOLN
INTRAVENOUS | Status: DC
  Administered 2016-11-02 (×2): via INTRAVENOUS

## 2016-11-02 SURGICAL SUPPLY — 35 items
APL SKNCLS STERI-STRIP NONHPOA (GAUZE/BANDAGES/DRESSINGS) ×1
APPLIER CLIP ROT 10 11.4 M/L (STAPLE) ×3
APR CLP MED LRG 11.4X10 (STAPLE) ×1
BAG SPEC RTRVL LRG 6X4 10 (ENDOMECHANICALS) ×1
BENZOIN TINCTURE PRP APPL 2/3 (GAUZE/BANDAGES/DRESSINGS) ×2 IMPLANT
CABLE HIGH FREQUENCY MONO STRZ (ELECTRODE) ×3 IMPLANT
CHLORAPREP W/TINT 26ML (MISCELLANEOUS) ×6 IMPLANT
CLIP APPLIE ROT 10 11.4 M/L (STAPLE) ×1 IMPLANT
CLOSURE WOUND 1/2 X4 (GAUZE/BANDAGES/DRESSINGS) ×1
COVER MAYO STAND STRL (DRAPES) ×3 IMPLANT
COVER SURGICAL LIGHT HANDLE (MISCELLANEOUS) ×3 IMPLANT
DECANTER SPIKE VIAL GLASS SM (MISCELLANEOUS) ×3 IMPLANT
DRAPE C-ARM 42X120 X-RAY (DRAPES) ×3 IMPLANT
ELECT REM PT RETURN 15FT ADLT (MISCELLANEOUS) ×3 IMPLANT
GAUZE SPONGE 2X2 8PLY STRL LF (GAUZE/BANDAGES/DRESSINGS) ×1 IMPLANT
GLOVE SURG ORTHO 8.0 STRL STRW (GLOVE) ×3 IMPLANT
GOWN STRL REUS W/TWL XL LVL3 (GOWN DISPOSABLE) ×6 IMPLANT
HEMOSTAT SURGICEL 4X8 (HEMOSTASIS) IMPLANT
KIT BASIN OR (CUSTOM PROCEDURE TRAY) ×3 IMPLANT
POUCH SPECIMEN RETRIEVAL 10MM (ENDOMECHANICALS) ×3 IMPLANT
SCISSORS LAP 5X35 DISP (ENDOMECHANICALS) ×3 IMPLANT
SET CHOLANGIOGRAPH MIX (MISCELLANEOUS) ×3 IMPLANT
SET IRRIG TUBING LAPAROSCOPIC (IRRIGATION / IRRIGATOR) ×2 IMPLANT
SLEEVE XCEL OPT CAN 5 100 (ENDOMECHANICALS) ×3 IMPLANT
SPONGE GAUZE 2X2 STER 10/PKG (GAUZE/BANDAGES/DRESSINGS) ×2
STRIP CLOSURE SKIN 1/2X4 (GAUZE/BANDAGES/DRESSINGS) ×2 IMPLANT
SUT MNCRL AB 4-0 PS2 18 (SUTURE) ×5 IMPLANT
TAPE CLOTH 2X10 TAN LF (GAUZE/BANDAGES/DRESSINGS) ×2 IMPLANT
TOWEL OR 17X26 10 PK STRL BLUE (TOWEL DISPOSABLE) ×3 IMPLANT
TOWEL OR NON WOVEN STRL DISP B (DISPOSABLE) ×3 IMPLANT
TRAY LAPAROSCOPIC (CUSTOM PROCEDURE TRAY) ×3 IMPLANT
TROCAR BLADELESS OPT 5 100 (ENDOMECHANICALS) ×3 IMPLANT
TROCAR XCEL BLUNT TIP 100MML (ENDOMECHANICALS) ×3 IMPLANT
TROCAR XCEL NON-BLD 11X100MML (ENDOMECHANICALS) ×3 IMPLANT
TUBING INSUF HEATED (TUBING) IMPLANT

## 2016-11-02 NOTE — Interval H&P Note (Signed)
History and Physical Interval Note:  11/02/2016 8:18 AM  Adriana [name]  has presented today for surgery, with the diagnosis of Cholecystitis, cholelithiasis.  The various methods of treatment have been discussed with the patient and family. After consideration of risks, benefits and other options for treatment, the patient has consented to    Procedure(s): LAPAROSCOPIC CHOLECYSTECTOMY WITH INTRAOPERATIVE CHOLANGIOGRAM (N/A) as a surgical intervention .    The patient's history has been reviewed, patient examined, no change in status, stable for surgery.  I have reviewed the patient's chart and labs.  Questions were answered to the patient's satisfaction.    [name], MD, [facility] Office: [place]

## 2016-11-02 NOTE — Op Note (Signed)
Procedure Note  Pre-operative Diagnosis:  Chronic cholecystitis, cholelithiasis, abdominal pain  Post-operative Diagnosis:  same  Surgeon:  [name], MD, FACS  Assistant:  none   Procedure:  Laparoscopic cholecystectomy with intra-operative cholangiography  Anesthesia:  General  Estimated Blood Loss:  minimal  Drains: none         Specimen: Gallbladder to pathology  Indications:  The patient is a 76 year old female who presents for evaluation of gall stones.  Patient is referred by Dr. Hollace Kinnier for evaluation and management of symptomatic cholelithiasis and probable chronic cholecystitis. Patient was initially diagnosed with cholelithiasis approximately 12 years ago. She had a significant episode of biliary colic with right upper quadrant abdominal pain radiating to the right shoulder. Patient altered her diet and became vegetarian. She has had limited episodes until approximately 1 month ago. Patient had had significant weight loss after the death of her husband. She had lost approximately 15-20 pounds to a low of 101 pounds. Patient was no longer eating her vegetarian diet and experienced an episode of biliary colic which was quite severe, lasting for approximately 5 hours, with right upper quadrant abdominal pain and radiation to the shoulder. Patient denies any jaundice or acholic stools. She denies any fevers or chills. She has had no prior abdominal surgery. She has noted a right inguinal hernia which is largely asymptomatic. Patient does have a history of hiatal hernia. Patient underwent ultrasound on September 15, 2016. This showed multiple small layering gallstones without signs of acute cholecystitis. There is no biliary dilatation. Patient presents today for cholecystectomy.  Procedure Details:  The patient was seen in the pre-op holding area. The risks, benefits, complications, treatment options, and expected outcomes were previously discussed with the patient.  The patient agreed with the proposed plan and has signed the informed consent form.  The patient was brought to the Operating Room, identified as Adriana [name] and the procedure verified as laparoscopic cholecystectomy with intraoperative cholangiography. A "time out" was completed and the above information confirmed.  The patient was placed in the supine position. Following induction of general anesthesia, the abdomen was prepped and draped in the usual aseptic fashion.  An incision was made in the skin near the umbilicus. The midline fascia was incised and the peritoneal cavity was entered and a Hasson canula was introduced under direct vision.  The Hasson canula was secured with a 0-Vicryl pursestring suture. Pneumoperitoneum was established with carbon dioxide. Additional trocars were introduced under direct vision along the right costal margin in the midline, mid-clavicular line, and anterior axillary line.   The gallbladder was identified and the fundus grasped and retracted cephalad. Adhesions were taken down bluntly and the electrocautery was utilized as needed, taking care not to injure any adjacent structures. The infundibulum was grasped and retracted laterally, exposing the peritoneum overlying the triangle of Calot. The peritoneum was incised and structures exposed with blunt dissection. The cystic duct was clearly identified, bluntly dissected circumferentially, and clipped at the neck of the gallbladder.  An incision was made in the cystic duct and the cholangiogram catheter introduced. The catheter was secured using an ligaclip.  Real-time cholangiography was performed using C-arm fluoroscopy.  There was rapid filling of a normal caliber common bile duct.  There was reflux of contrast into the left and right hepatic ductal systems.  There was free flow distally into the duodenum without filling defect or obstruction.  The catheter was removed from the peritoneal cavity.  The cystic duct  was then ligated with surgical clips and divided. The cystic artery was identified, dissected circumferentially, ligated with ligaclips, and divided.  The gallbladder was dissected away from the liver bed using the electrocautery for hemostasis. The gallbladder was completely removed from the liver and placed into an endocatch bag. The gallbladder was removed in the endocatch bag through the umbilical port site and submitted to pathology for review.  The right upper quadrant was irrigated and the gallbladder bed was inspected. Hemostasis was achieved with the electrocautery.  Pneumoperitoneum was released after viewing removal of the trocars with good hemostasis noted. The umbilical wound was irrigated and the fascia was then closed with the pursestring suture.  Local anesthetic was infiltrated at all port sites. The skin incisions were closed with 4-0 Monocril subcuticular sutures and steri-strips and dressings were applied.  Instrument, sponge, and needle counts were correct at the conclusion of the case.  The patient was awakened from anesthesia and brought to the recovery room in stable condition.  The patient tolerated the procedure well.   [name], MD, [facility] Office: [phone]

## 2016-11-02 NOTE — Anesthesia Postprocedure Evaluation (Signed)
Anesthesia Post Note  Patient: Adriana [name]  Procedure(s) Performed: Procedure(s) (LRB): LAPAROSCOPIC CHOLECYSTECTOMY WITH INTRAOPERATIVE CHOLANGIOGRAM (N/A)     Patient location during evaluation: PACU Anesthesia Type: General Level of consciousness: awake and alert Pain management: pain level controlled Vital Signs Assessment: post-procedure vital signs reviewed and stable Respiratory status: spontaneous breathing, nonlabored ventilation, respiratory function stable and patient connected to nasal cannula oxygen Cardiovascular status: blood pressure returned to baseline and stable Postop Assessment: no signs of nausea or vomiting Anesthetic complications: no    Last Vitals:  Vitals:   11/02/16 1300 11/02/16 1400  BP: (!) 126/58 138/70  Pulse: 86 89  Resp: 17 18  Temp: 36.4 C 36.7 C    Last Pain:  Vitals:   11/02/16 1516  TempSrc:   PainSc: Asleep                 Mayar Whittier S

## 2016-11-02 NOTE — Progress Notes (Signed)
Patient ID: VOLLIE AARON, female   DOB: 1940/12/23, 76 y.o.   MRN: [id number]  [facility]  Discussed intra-op cholangiogram with radiology.  Possible distal CBD stone noted.  Will request GI consultation from [facility] to evaluate.  May need MRCP or ERCP to further evaluate and manage.  Discussed with patient at bedside.  [name], MD, [facility] Office: [phone]

## 2016-11-02 NOTE — Progress Notes (Signed)
We were called in consult for this patient for possible +IOC after choley today, but patient is [facility] patient. I have called their office and Dr. Cristina Gong will be seeing the patient in consult.  Ellouise Newer, PA-C [facility]

## 2016-11-02 NOTE — Anesthesia Procedure Notes (Signed)
Procedure Name: Intubation Date/Time: 11/02/2016 8:37 AM Performed by: Danley Danker L Patient Re-evaluated:Patient Re-evaluated prior to induction Oxygen Delivery Method: Circle system utilized Preoxygenation: Pre-oxygenation with 100% oxygen Induction Type: IV induction Ventilation: Mask ventilation without difficulty Laryngoscope Size: Miller and 2 Grade View: Grade I Tube type: Oral Tube size: 7.0 mm Number of attempts: 1 Airway Equipment and Method: Stylet Placement Confirmation: ETT inserted through vocal cords under direct vision,  positive ETCO2 and breath sounds checked- equal and bilateral Secured at: 21 cm Tube secured with: Tape Dental Injury: Teeth and Oropharynx as per pre-operative assessment

## 2016-11-02 NOTE — Anesthesia Preprocedure Evaluation (Signed)
Anesthesia Evaluation  Patient identified by MRN, date of birth, ID band Patient awake    Reviewed: Allergy & Precautions, NPO status , Patient's Chart, lab work & pertinent test results  Airway Mallampati: II  TM Distance: >3 FB Neck ROM: Full    Dental no notable dental hx.    Pulmonary neg pulmonary ROS,    Pulmonary exam normal breath sounds clear to auscultation       Cardiovascular negative cardio ROS Normal cardiovascular exam Rhythm:Regular Rate:Normal     Neuro/Psych negative neurological ROS  negative psych ROS   GI/Hepatic Neg liver ROS, GERD  ,  Endo/Other  negative endocrine ROS  Renal/GU negative Renal ROS  negative genitourinary   Musculoskeletal negative musculoskeletal ROS (+)   Abdominal   Peds negative pediatric ROS (+)  Hematology negative hematology ROS (+)   Anesthesia Other Findings   Reproductive/Obstetrics negative OB ROS                             Anesthesia Physical Anesthesia Plan  ASA: II  Anesthesia Plan: General   Post-op Pain Management:    Induction: Intravenous  PONV Risk Score and Plan: 1 and Ondansetron and Dexamethasone  Airway Management Planned: Oral ETT  Additional Equipment:   Intra-op Plan:   Post-operative Plan: Extubation in OR  Informed Consent: I have reviewed the patients History and Physical, chart, labs and discussed the procedure including the risks, benefits and alternatives for the proposed anesthesia with the patient or authorized representative who has indicated his/her understanding and acceptance.   Dental advisory given  Plan Discussed with: CRNA and Surgeon  Anesthesia Plan Comments:         Anesthesia Quick Evaluation

## 2016-11-02 NOTE — Transfer of Care (Signed)
Immediate Anesthesia Transfer of Care Note  Patient: Adriana [name]  Procedure(s) Performed: Procedure(s): LAPAROSCOPIC CHOLECYSTECTOMY WITH INTRAOPERATIVE CHOLANGIOGRAM (N/A)  Patient Location: PACU  Anesthesia Type:General  Level of Consciousness: awake, alert  and oriented  Airway & Oxygen Therapy: Patient Spontanous Breathing and Patient connected to face mask oxygen  Post-op Assessment: Report given to RN and Post -op Vital signs reviewed and stable  Post vital signs: Reviewed and stable  Last Vitals:  Vitals:   11/02/16 0641  BP: (!) 162/78  Pulse: 93  Resp: 18  Temp: 36.8 C    Last Pain:  Vitals:   11/02/16 0641  TempSrc: Oral      Patients Stated Pain Goal: 4 (70/01/74 9449)  Complications: No apparent anesthesia complications

## 2016-11-03 ENCOUNTER — Observation Stay (HOSPITAL_COMMUNITY): Payer: Medicare Other

## 2016-11-03 DIAGNOSIS — K8064 Calculus of gallbladder and bile duct with chronic cholecystitis without obstruction: Secondary | ICD-10-CM | POA: Diagnosis not present

## 2016-11-03 MED ORDER — DEXTROSE 5 % IV SOLN: 1.0000 g | INTRAVENOUS | Status: DC

## 2016-11-03 MED ORDER — GADOBENATE DIMEGLUMINE 529 MG/ML IV SOLN
10.0000 mL | Freq: Once | INTRAVENOUS | Status: AC | PRN
  Administered 2016-11-03: 10 mL via INTRAVENOUS

## 2016-11-03 NOTE — Care Management Obs Status (Signed)
MEDICARE OBSERVATION STATUS NOTIFICATION   Patient Details  Name: Adriana [name] MRN: [id number] Date of Birth: 1941-01-09   Medicare Observation Status Notification Given:  Yes    Guadalupe Maple, RN 11/03/2016, 9:47 AM

## 2016-11-03 NOTE — Progress Notes (Signed)
Nutrition Brief Note  Patient identified on the Malnutrition Screening Tool (MST) Report  Pt with weight loss in the past. Saw an outpatient RD in June 2018 for weight gain. Pt lost weight d/t husband's passing. Pt's UBW is 110-115 lb. Now at UBW. Eating 100% of meals prior to NPO (changed for MRI).   Wt Readings from Last 15 Encounters:  11/02/16 110 lb (49.9 kg)  11/03/16 110 lb (49.9 kg)  10/24/16 110 lb (49.9 kg)  10/20/16 110 lb (49.9 kg)  09/19/16 103 lb 9 oz (47 kg)  09/09/16 105 lb (47.6 kg)  06/09/16 106 lb (48.1 kg)  06/06/16 106 lb (48.1 kg)  06/08/15 112 lb (50.8 kg)  06/06/14 114 lb (51.7 kg)  02/08/13 113 lb (51.3 kg)  03/24/11 115 lb 8 oz (52.4 kg)  02/07/11 114 lb (51.7 kg)    Body mass index is 18.3 kg/m. Patient meets criteria for underweight based on current BMI.   Current diet order is regular, patient is consuming approximately 100% of meals at this time. Labs and medications reviewed.   No nutrition interventions warranted at this time. If nutrition issues arise, please consult RD.   Clayton Bibles, MS, RD, LDN Pager: [phone] After Hours Pager: [phone]

## 2016-11-03 NOTE — Consult Note (Signed)
Referring Provider:  Dr. Armandina Gemma Primary Care Physician:  Gayland Curry, DO Primary Gastroenterologist:  Dr. Teena Irani  Reason for Consultation:  Common bile duct stone  HPI: Adriana [name] is a 76 y.o. female 1 day status post laparoscopic cholecystectomy which showed an apparent, equivocal common duct stone, with flow of contrast into the duodenum after a sufficient amount of pressure was exerted. The patient had normal preoperative liver chemistries and was deemed at intermediate probability for a stone being present, therefore, she underwent an MRCP today which again appeared to show a 3 mm stone in the common duct. The patient is therefore being considered for ERCP and stone extraction. She is comfortable at this time. She is in good general medical health.   Past Medical History:  Diagnosis Date  . Cancer (HCC)    squamous cell face removed  . Cellulitis, toe    osteomylitis  . Cholelithiasis   . Diverticulitis   . Hiatal hernia    hx of 25 years ago  . Hyperlipidemia   . Mitral valve prolapse   . Osteoporosis     Past Surgical History:  Procedure Laterality Date  . BREAST BIOPSY     Left,negative   . CHOLECYSTECTOMY     11/02/16 Dr. Harlow Asa  . CHOLECYSTECTOMY N/A 11/02/2016   Procedure: LAPAROSCOPIC CHOLECYSTECTOMY WITH INTRAOPERATIVE CHOLANGIOGRAM;  Surgeon: Armandina Gemma, MD;  Location: [facility];  Service: General;  Laterality: N/A;  . COLONOSCOPY  05/2003   No polyps, hemorrhoids,and diverticulosis   . EYE SURGERY     r eye cataract removed    Prior to Admission medications   Medication Sig Start Date End Date Taking? Authorizing Provider  Cholecalciferol (VITAMIN D-3) 1000 units CAPS Take 1,000 Units by mouth daily with supper.    Yes [provider]  Polyethyl Glycol-Propyl Glycol (LUBRICANT EYE DROPS) 0.4-0.3 % SOLN Place 1 drop into both eyes daily as needed (for dry eyes.).   Yes [provider]  denosumab (PROLIA) 60 MG/ML SOLN injection  Inject 60 mg into the skin every 6 (six) months. Administer in upper arm, thigh, or abdomen    [provider]  Teriparatide, Recombinant, 600 MCG/2.4ML SOLN Inject 0.08 mLs (20 mcg total) into the skin daily. Patient not taking: Reported on 10/21/2016 09/09/16   Hollace Kinnier L, DO    Current Facility-Administered Medications  Medication Dose Route Frequency Provider Last Rate Last Dose  . acetaminophen (TYLENOL) tablet 650 mg  650 mg Oral Q6H PRN Armandina Gemma, MD   650 mg at 11/03/16 9767   Or  . acetaminophen (TYLENOL) suppository 650 mg  650 mg Rectal Q6H PRN Armandina Gemma, MD      . Derrill Memo ON 11/04/2016] cefTRIAXone (ROCEPHIN) 1 g in dextrose 5 % 50 mL IVPB  1 g Intravenous 30 min Pre-Op Dastan Krider, MD      . HYDROcodone-acetaminophen (NORCO/VICODIN) 5-325 MG per tablet 1-2 tablet  1-2 tablet Oral Q4H PRN Armandina Gemma, MD   1 tablet at 11/02/16 1440  . HYDROmorphone (DILAUDID) injection 1 mg  1 mg Intravenous Q2H PRN Armandina Gemma, MD   1 mg at 11/02/16 1500  . ondansetron (ZOFRAN-ODT) disintegrating tablet 4 mg  4 mg Oral Q6H PRN Armandina Gemma, MD       Or  . ondansetron (ZOFRAN) injection 4 mg  4 mg Intravenous Q6H PRN Armandina Gemma, MD   4 mg at 11/02/16 1455    Allergies as of 10/12/2016  . (No Known Allergies)  Family History  Problem Relation Age of Onset  . Pneumonia Father 26  . Cerebrovascular Accident Mother 56  . Stroke Mother   . Breast cancer Sister 43    Social History   Social History  . Marital status: Widowed    Spouse name: N/A  . Number of children: N/A  . Years of education: N/A   Occupational History  . Not on file.   Social History Main Topics  . Smoking status: Never Smoker  . Smokeless tobacco: Never Used  . Alcohol use 0.6 oz/week    1 Glasses of wine per week     Comment: occasional  . Drug use: No  . Sexual activity: No   Other Topics Concern  . Not on file   Social History Narrative   Dr.Stoneburner, Ophthalmologist    Dr.Peterson, Urologist   Dr.Lomax, Dermatologist      Review of Systems: No cardiopulmonary problems. No blood thinners.  Physical Exam: Vital signs in last 24 hours: Temp:  [97.7 F (36.5 C)-98.2 F (36.8 C)] 97.8 F (36.6 C) (08/02 1400) Pulse Rate:  [73-92] 80 (08/02 1400) Resp:  [17-18] 18 (08/02 1400) BP: (103-130)/(45-65) 118/64 (08/02 1400) SpO2:  [95 %-100 %] 100 % (08/02 1400) Weight:  [49.9 kg (110 lb)] 49.9 kg (110 lb) (08/02 1137) Last BM Date: 11/01/16   This is a pleasant, articulate, rather thin Caucasian female, very well preserved for her age. She is anicteric and without overt pallor. Chest clear, heart without murmur or arrhythmia, abdomen mildly protuberant but soft and without overt tenderness. No peripheral edema, no evident focal neurologic deficits. Mood normal.  Intake/Output from previous day: 08/01 0701 - 08/02 0700 In: 1740 [P.O.:240; I.V.:1500] Out: 1445 [Urine:1425; Blood:20] Intake/Output this shift: Total I/O In: 360 [P.O.:360] Out: 300 [Urine:300]  Lab Results: No results for input(s): WBC, HGB, HCT, PLT in the last 72 hours. BMET No results for input(s): NA, K, CL, CO2, GLUCOSE, BUN, CREATININE, CALCIUM in the last 72 hours. LFT No results for input(s): PROT, ALBUMIN, AST, ALT, ALKPHOS, BILITOT, BILIDIR, IBILI in the last 72 hours. PT/INR No results for input(s): LABPROT, INR in the last 72 hours.  Studies/Results: Dg Cholangiogram Operative  Result Date: 11/02/2016 CLINICAL DATA:  76 year old female undergoing laparoscopic cholecystectomy. EXAM: INTRAOPERATIVE CHOLANGIOGRAM TECHNIQUE: Cholangiographic images from the C-arm fluoroscopic device were submitted for interpretation post-operatively. Please see the procedural report for the amount of contrast and the fluoroscopy time utilized. COMPARISON:  Abdominal ultrasound 09/15/2016 FINDINGS: Intraoperative cholangiogram at the time of laparoscopic cholecystectomy. 2 cine clips are submitted  for review. The images demonstrate cannulation of the cystic duct remanent and opacification of the biliary tree. No significant biliary ductal dilatation, stenosis or stricture. There is a suggestion of a rounded filling defect in the distal common bile duct. However, contrast material does pass through the ampulla and into the duodenum. IMPRESSION: Intraoperative cholangiogram concerning for a small nonobstructing distal common bile duct stone or sludge. Alternately, this could represent an impression from a prominent papilloma. These results were called by telephone at the time of interpretation on 11/02/2016 at 10:03 am to Dr. Armandina Gemma , who verbally acknowledged these results. Electronically Signed   By: Jacqulynn Cadet M.D.   On: 11/02/2016 10:04   Mr Abdomen With Mrcp W Contrast  Result Date: 11/03/2016 CLINICAL DATA:  Evaluate for common bile duct stone following cholecystectomy. EXAM: MRI ABDOMEN WITHOUT CONTRAST  (INCLUDING MRCP) TECHNIQUE: Multiplanar multisequence MR imaging of the abdomen was performed. Heavily T2-weighted  images of the biliary and pancreatic ducts were obtained, and three-dimensional MRCP images were rendered by post processing. COMPARISON:  Intraoperative cholangiogram 11/02/2016 FINDINGS: Lower chest:  Small RIGHT pleural effusion Hepatobiliary: There is no intrahepatic duct dilatation. The common hepatic duct and common bile duct are normal caliber measuring 5 mm and 5 mm respectively. Within the mid common bile duct at the level of the cystic duct remnant, there is a small filling defect measuring 3 mm identified on the heavily T2 weighted MRCP sequences (image 53, series 4). This small filling defect is also identified on the axial T2 weighted images (image 27, series 3) and image 27, series 12. The more distal common bile duct leading to the pancreatic head is normal caliber without evidence of obstructing lesion. There is no focal hepatic lesion. Patient status post  cholecystectomy with expected edema and fluid in the gallbladder fossa. Pancreas: Normal pancreatic parenchymal intensity. No ductal dilatation or inflammation. Spleen: Normal spleen. Adrenals/urinary tract: Adrenal glands and kidneys are normal. Stomach/Bowel: Stomach and limited of the small bowel is unremarkable Vascular/Lymphatic: Abdominal aortic normal caliber. No retroperitoneal periportal lymphadenopathy. Musculoskeletal: No aggressive osseous lesion IMPRESSION: 1. Probable small 3 mm stone within the mid common bile duct without obstruction. 2. No evidence of common bile duct obstruction. No stones present in the distal common bile duct through the pancreatic head. No proximal duct dilatation. 3. Postcholecystectomy without complication. Electronically Signed   By: Suzy Bouchard M.D.   On: 11/03/2016 15:24    Impression: Asymptomatic choledocholithiasis, status post cholecystectomy yesterday.  Plan: The case was discussed with my partner, Dr. Watt Climes who has a particular interest in biliary cases. It is his opinion, as well as mine, that even a small common duct stone like this is potentially hazardous for the patient, in particular for the risk of causing gallstone pancreatitis, so it is best removed.    I discussed the nature, technique, purpose, and risks of ERCP with the patient and her son who is at the bedside. Risks discussed included a roughly 3% risk of pancreatitis which can rarely be fatal, bleeding, perforation, infection, and anesthesia risk. The patient seems to have a good understanding, was offered the opportunity to ask questions, and is agreeable to proceed. She is aware that the procedure will most likely be done by my partner, Dr. [name].   LOS: 0 days   Harveen Flesch V  11/03/2016, 5:49 PM   Pager [phone] If no answer or after 5 PM call [phone]

## 2016-11-03 NOTE — Progress Notes (Signed)
General Surgery [facility]  Assessment & Plan: POD#1 status post lap chole with IOC  IOC with possible distal CBD stone - awaiting MRCP ordered yesterday  Advance diet as tolerated  Possible discharge later today if MRCP negative, otherwise GI consult and ERCP        [name], MD, [facility]       Office: [phone]    Chief Complaint: Abdominal pain  Subjective: Patient up in chair taking liquids, some pain.  Some nausea yesterday.  Objective: Vital signs in last 24 hours: Temp:  [97.6 F (36.4 C)-98.6 F (37 C)] 98 F (36.7 C) (08/02 0540) Pulse Rate:  [77-92] 86 (08/02 0540) Resp:  [11-18] 18 (08/02 0540) BP: (103-138)/(45-73) 113/45 (08/02 0540) SpO2:  [95 %-100 %] 95 % (08/02 0540) Last BM Date: 11/01/16  Intake/Output from previous day: 08/01 0701 - 08/02 0700 In: 1740 [P.O.:240; I.V.:1500] Out: 1445 [Urine:1425; Blood:20] Intake/Output this shift: No intake/output data recorded.  Physical Exam: HEENT - sclerae clear, mucous membranes moist Neck - soft Abdomen - soft, mild distension; dressings dry Ext - no edema, non-tender Neuro - alert & oriented, no focal deficits  Lab Results:  No results for input(s): WBC, HGB, HCT, PLT in the last 72 hours. BMET No results for input(s): NA, K, CL, CO2, GLUCOSE, BUN, CREATININE, CALCIUM in the last 72 hours. PT/INR No results for input(s): LABPROT, INR in the last 72 hours. Comprehensive Metabolic Panel:    Component Value Date/Time   NA 138 10/24/2016 0837   NA 141 06/06/2016 0822   NA 142 06/05/2015 0844   NA 140 06/02/2014 0811   K 4.6 10/24/2016 0837   K 4.5 06/06/2016 0822   CL 106 10/24/2016 0837   CL 105 06/06/2016 0822   CO2 26 10/24/2016 0837   CO2 29 06/06/2016 0822   BUN 16 10/24/2016 0837   BUN 15 06/06/2016 0822   BUN 12 06/05/2015 0844   BUN 15 06/02/2014 0811   CREATININE 0.64 10/24/2016 0837   CREATININE 0.63 06/06/2016 0822    CREATININE 0.74 06/05/2015 0844   GLUCOSE 104 (H) 10/24/2016 0837   GLUCOSE 99 06/06/2016 0822   CALCIUM 8.8 (L) 10/24/2016 0837   CALCIUM 9.8 06/06/2016 0822   AST 21 10/24/2016 0837   AST 14 09/09/2016 1035   ALT 15 10/24/2016 0837   ALT 13 09/09/2016 1035   ALKPHOS 78 10/24/2016 0837   ALKPHOS 138 (H) 09/09/2016 1035   BILITOT 0.6 10/24/2016 0837   BILITOT 0.4 09/09/2016 1035   BILITOT 0.5 06/05/2015 0844   BILITOT 0.4 06/02/2014 0811   PROT 6.9 10/24/2016 0837   PROT 6.9 09/09/2016 1035   PROT 6.9 06/05/2015 0844   PROT 6.3 06/02/2014 0811   ALBUMIN 4.2 10/24/2016 0837   ALBUMIN 4.3 09/09/2016 1035   ALBUMIN 4.4 06/05/2015 0844   ALBUMIN 4.5 06/02/2014 2094    Studies/Results: Dg Cholangiogram Operative  Result Date: 11/02/2016 CLINICAL DATA:  76 year old female undergoing laparoscopic cholecystectomy. EXAM: INTRAOPERATIVE CHOLANGIOGRAM TECHNIQUE: Cholangiographic images from the C-arm fluoroscopic device were submitted for interpretation post-operatively. Please see the procedural report for the amount of contrast and the fluoroscopy time utilized. COMPARISON:  Abdominal ultrasound 09/15/2016 FINDINGS: Intraoperative cholangiogram at the time of laparoscopic cholecystectomy. 2 cine clips are submitted for review. The images demonstrate cannulation of the cystic duct remanent and opacification of the biliary tree. No significant biliary ductal dilatation, stenosis or stricture. There  is a suggestion of a rounded filling defect in the distal common bile duct. However, contrast material does pass through the ampulla and into the duodenum. IMPRESSION: Intraoperative cholangiogram concerning for a small nonobstructing distal common bile duct stone or sludge. Alternately, this could represent an impression from a prominent papilloma. These results were called by telephone at the time of interpretation on 11/02/2016 at 10:03 am to Dr. Armandina Gemma , who verbally acknowledged these results.  Electronically Signed   By: Jacqulynn Cadet M.D.   On: 11/02/2016 10:04      [name] 11/03/2016  Patient ID: Adriana [name], female   DOB: 08-Jan-1941, 76 y.o.   MRN: [id number]

## 2016-11-03 NOTE — Progress Notes (Signed)
Patient ID: Adriana [name], female   DOB: April 30, 1940, 76 y.o.   MRN: [id number]  [facility]  MRCP positive for mid CBD 3 mm stone.  Discussed with Dr. Cristina Gong and Dr. Watt Climes.  Patient would like to stay and have ERCP tomorrow.  Dr. Cristina Gong will see patient and arrange for procedure.  [name], MD, [facility] Office: [phone]

## 2016-11-03 NOTE — Progress Notes (Signed)
Patient aware is NPO for MRCP.Has had only coffee this am...will maintain NPO going forward. Contacted MRI they try to have her done by noon.

## 2016-11-04 ENCOUNTER — Encounter (HOSPITAL_COMMUNITY)
Admission: RE | Disposition: A | Discharge status: Home / Self Care | Payer: Self-pay | Source: Ambulatory Visit | Attending: Surgery

## 2016-11-04 ENCOUNTER — Observation Stay (HOSPITAL_COMMUNITY): Payer: Medicare Other | Admitting: Anesthesiology

## 2016-11-04 ENCOUNTER — Observation Stay (HOSPITAL_COMMUNITY): Payer: Medicare Other

## 2016-11-04 ENCOUNTER — Encounter (HOSPITAL_COMMUNITY): Payer: Self-pay | Admitting: Gastroenterology

## 2016-11-04 DIAGNOSIS — K828 Other specified diseases of gallbladder: Secondary | ICD-10-CM | POA: Diagnosis not present

## 2016-11-04 DIAGNOSIS — E785 Hyperlipidemia, unspecified: Secondary | ICD-10-CM | POA: Diagnosis not present

## 2016-11-04 DIAGNOSIS — K8064 Calculus of gallbladder and bile duct with chronic cholecystitis without obstruction: Secondary | ICD-10-CM | POA: Diagnosis not present

## 2016-11-04 DIAGNOSIS — Z85828 Personal history of other malignant neoplasm of skin: Secondary | ICD-10-CM | POA: Diagnosis not present

## 2016-11-04 HISTORY — PX: ENDOSCOPIC RETROGRADE CHOLANGIOPANCREATOGRAPHY (ERCP) WITH PROPOFOL: SHX5810

## 2016-11-04 LAB — COMPREHENSIVE METABOLIC PANEL
ALT: 181 U/L — AB (ref 14–54)
AST: 67 U/L — AB (ref 15–41)
Albumin: 3.7 g/dL (ref 3.5–5.0)
Alkaline Phosphatase: 77 U/L (ref 38–126)
Anion gap: 8 (ref 5–15)
BUN: 12 mg/dL (ref 6–20)
CHLORIDE: 106 mmol/L (ref 101–111)
CO2: 25 mmol/L (ref 22–32)
CREATININE: 0.59 mg/dL (ref 0.44–1.00)
Calcium: 8.2 mg/dL — ABNORMAL LOW (ref 8.9–10.3)
GFR calc non Af Amer: >60 mL/min (ref >60)
Glucose, Bld: 103 mg/dL — ABNORMAL HIGH (ref 65–99)
Potassium: 3.7 mmol/L (ref 3.5–5.1)
SODIUM: 139 mmol/L (ref 135–145)
Total Bilirubin: 0.7 mg/dL (ref 0.3–1.2)
Total Protein: 6.6 g/dL (ref 6.5–8.1)

## 2016-11-04 LAB — CBC
HCT: 36.9 % (ref 36.0–46.0)
HEMOGLOBIN: 12.2 g/dL (ref 12.0–15.0)
MCH: 30.6 pg (ref 26.0–34.0)
MCHC: 33.1 g/dL (ref 30.0–36.0)
MCV: 92.5 fL (ref 78.0–100.0)
Platelets: 224 10*3/uL (ref 150–400)
RBC: 3.99 MIL/uL (ref 3.87–5.11)
RDW: 14.4 % (ref 11.5–15.5)
WBC: 14.7 10*3/uL — AB (ref 4.0–10.5)

## 2016-11-04 LAB — TYPE AND SCREEN
ABO/RH(D): O POS
ANTIBODY SCREEN: NEGATIVE

## 2016-11-04 SURGERY — ENDOSCOPIC RETROGRADE CHOLANGIOPANCREATOGRAPHY (ERCP) WITH PROPOFOL
Anesthesia: General

## 2016-11-04 MED ORDER — DEXAMETHASONE SODIUM PHOSPHATE 10 MG/ML IJ SOLN
INTRAMUSCULAR | Status: AC
  Filled 2016-11-04: qty 1

## 2016-11-04 MED ORDER — ONDANSETRON HCL 4 MG/2ML IJ SOLN
INTRAMUSCULAR | Status: AC
  Filled 2016-11-04: qty 2

## 2016-11-04 MED ORDER — GLUCAGON HCL RDNA (DIAGNOSTIC) 1 MG IJ SOLR
INTRAMUSCULAR | Status: AC
  Filled 2016-11-04: qty 1

## 2016-11-04 MED ORDER — ONDANSETRON HCL 4 MG/2ML IJ SOLN
INTRAMUSCULAR | Status: DC | PRN
  Administered 2016-11-04: 4 mg via INTRAVENOUS

## 2016-11-04 MED ORDER — FENTANYL CITRATE (PF) 100 MCG/2ML IJ SOLN
INTRAMUSCULAR | Status: AC
Start: 2016-11-04 — End: ?
  Filled 2016-11-04: qty 2

## 2016-11-04 MED ORDER — LIDOCAINE 2% (20 MG/ML) 5 ML SYRINGE
INTRAMUSCULAR | Status: AC
  Filled 2016-11-04: qty 5

## 2016-11-04 MED ORDER — PHENYLEPHRINE 40 MCG/ML (10ML) SYRINGE FOR IV PUSH (FOR BLOOD PRESSURE SUPPORT)
PREFILLED_SYRINGE | INTRAVENOUS | Status: AC
  Filled 2016-11-04: qty 10

## 2016-11-04 MED ORDER — FENTANYL CITRATE (PF) 100 MCG/2ML IJ SOLN
INTRAMUSCULAR | Status: DC | PRN
  Administered 2016-11-04: 50 ug via INTRAVENOUS
  Administered 2016-11-04: 25 ug via INTRAVENOUS

## 2016-11-04 MED ORDER — SUCCINYLCHOLINE CHLORIDE 20 MG/ML IJ SOLN
INTRAMUSCULAR | Status: DC | PRN
  Administered 2016-11-04: 80 mg via INTRAVENOUS

## 2016-11-04 MED ORDER — DEXAMETHASONE SODIUM PHOSPHATE 10 MG/ML IJ SOLN
INTRAMUSCULAR | Status: DC | PRN
  Administered 2016-11-04: 10 mg via INTRAVENOUS

## 2016-11-04 MED ORDER — SUCCINYLCHOLINE CHLORIDE 200 MG/10ML IV SOSY
PREFILLED_SYRINGE | INTRAVENOUS | Status: AC
  Filled 2016-11-04: qty 10

## 2016-11-04 MED ORDER — PROPOFOL 10 MG/ML IV BOLUS
INTRAVENOUS | Status: AC
  Filled 2016-11-04: qty 20

## 2016-11-04 MED ORDER — LACTATED RINGERS IV SOLN
INTRAVENOUS | Status: DC
  Administered 2016-11-04: 12:00:00 via INTRAVENOUS

## 2016-11-04 MED ORDER — IOPAMIDOL (ISOVUE-M 300) INJECTION 61%
INTRAMUSCULAR | Status: DC | PRN
  Administered 2016-11-04: 28 mL via INTRATHECAL

## 2016-11-04 MED ORDER — LIDOCAINE HCL (CARDIAC) 20 MG/ML IV SOLN
INTRAVENOUS | Status: DC | PRN
  Administered 2016-11-04: 50 mg via INTRAVENOUS

## 2016-11-04 MED ORDER — PROPOFOL 10 MG/ML IV BOLUS
INTRAVENOUS | Status: DC | PRN
  Administered 2016-11-04: 100 mg via INTRAVENOUS

## 2016-11-04 MED ORDER — INDOMETHACIN 50 MG RE SUPP
RECTAL | Status: AC
  Filled 2016-11-04: qty 2

## 2016-11-04 MED ORDER — SODIUM CHLORIDE 0.9 % IV SOLN: INTRAVENOUS | Status: DC

## 2016-11-04 NOTE — Op Note (Addendum)
[facility] Patient Name: Adriana [name] Procedure Date: 11/04/2016 MRN: [id number] Attending MD: [name] , MD Date of Birth: [birth year] CSN: [id number] Age: 76 Admit Type: Inpatient Procedure:                ERCP Indications:              For therapy of bile duct stone(s) Providers:                [name], MD, Lillie Fragmin, RN, Elspeth Cho                            Tech., Technician, Rosario Adie, CRNA Referring MD:              Medicines:                Monitored Anesthesia Care Complications:            No immediate complications. Estimated Blood Loss:     Estimated blood loss: none. Procedure:                Pre-Anesthesia Assessment:                           - Prior to the procedure, a History and Physical                            was performed, and patient medications and                            allergies were reviewed. The patient's tolerance of                            previous anesthesia was also reviewed. The risks                            and benefits of the procedure and the sedation                            options and risks were discussed with the patient.                            All questions were answered, and informed consent                            was obtained. Prior Anticoagulants: The patient has                            taken no previous anticoagulant or antiplatelet                            agents. ASA Grade Assessment: II - A patient with                            mild systemic disease. After reviewing the risks  and benefits, the patient was deemed in                            satisfactory condition to undergo the procedure.                           After obtaining informed consent, the scope was                            passed under direct vision. Throughout the                            procedure, the patient's blood pressure, pulse, and                            oxygen  saturations were monitored continuously. The                            [id number]) scope was introduced through                            the mouth, and used to inject contrast into and                            used to cannulate the bile duct. The ERCP was                            accomplished without difficulty. The patient                            tolerated the procedure well. Scope In: Scope Out: Findings:      A scout film of the abdomen was obtained. Surgical clips, consistent       with a previous cholecystectomy, were seen in the area of the right       upper quadrant of the abdomen. The esophagus was successfully intubated       under direct vision. The scope was advanced to a normal major papilla in       the descending duodenum without detailed examination of the pharynx,       larynx and associated structures, and upper GI tract. The upper GI tract       was grossly normal. The bile duct was deeply cannulated with the       sphincterotome. Contrast was injected. I personally interpreted the bile       duct images. There was brisk flow of contrast through the ducts. Image       quality was excellent. Contrast extended to the entire biliary tree. The       lower third of the main bile duct contained filling defect(s) thought to       be a stone. A 0.035 inch straight standard wire was passed into the       biliary tree. A 12 mm biliary sphincterotomy was made with a       sphincterotome using ERBE electrocautery. There was no       post-sphincterotomy bleeding. The biliary tree was swept with a 12 mm  balloon starting at the lower third of the main duct. One stone was       removed. No stones remained. Impression:               - A filling defect consistent with a stone was seen                            on the cholangiogram.                           - Choledocholithiasis was found. Complete removal                            was accomplished by biliary  sphincterotomy and                            balloon extraction.                           - A biliary sphincterotomy was performed.                           - The biliary tree was swept. Moderate Sedation:      Patient did not receive moderate sedation for this procedure, but       instead received monitored anesthesia care. Recommendation:           - Resume regular diet. Procedure Code(s):        --- Professional ---                           [phone], Endoscopic retrograde                            cholangiopancreatography (ERCP); with removal of                            calculi/debris from biliary/pancreatic duct(s)                           43262, Endoscopic retrograde                            cholangiopancreatography (ERCP); with                            sphincterotomy/papillotomy                           [phone], Endoscopic catheterization of the biliary                            ductal system, radiological supervision and                            interpretation Diagnosis Code(s):        --- Professional ---  K80.50, Calculus of bile duct without cholangitis                            or cholecystitis without obstruction                           R93.2, Abnormal findings on diagnostic imaging of                            liver and biliary tract CPT copyright 2016 [employer]. All rights reserved. The codes documented in this report are preliminary and upon coder review may  be revised to meet current compliance requirements. [name], MD 11/04/2016 1:18:03 PM This report has been signed electronically. Number of Addenda: 0

## 2016-11-04 NOTE — Interval H&P Note (Signed)
History and Physical Interval Note: 76 year old female, status post cholecystectomy, 3 mm CBD stone noted on intraoperative cholangiogram and MRCP. Will schedule for ERCP, sphincterotomy and balloon sweep. LFTs within normal limits.  11/04/2016 11:55 AM  Adriana [name]  has presented today for ERCP with the diagnosis of common bile duct stone  The various methods of treatment have been discussed with the patient and family. After consideration of risks, benefits and other options for treatment, the patient has consented to  Procedure(s): ENDOSCOPIC RETROGRADE CHOLANGIOPANCREATOGRAPHY (ERCP) WITH PROPOFOL (N/A) as a surgical intervention .  The patient's history has been reviewed, patient examined, no change in status, stable for surgery.  I have reviewed the patient's chart and labs.  Questions were answered to the patient's satisfaction.     [name]

## 2016-11-04 NOTE — H&P (View-Only) (Signed)
Referring Provider:  Dr. Armandina Gemma Primary Care Physician:  Gayland Curry, DO Primary Gastroenterologist:  Dr. Teena Irani  Reason for Consultation:  Common bile duct stone  HPI: Adriana [name] is a 76 y.o. female 1 day status post laparoscopic cholecystectomy which showed an apparent, equivocal common duct stone, with flow of contrast into the duodenum after a sufficient amount of pressure was exerted. The patient had normal preoperative liver chemistries and was deemed at intermediate probability for a stone being present, therefore, she underwent an MRCP today which again appeared to show a 3 mm stone in the common duct. The patient is therefore being considered for ERCP and stone extraction. She is comfortable at this time. She is in good general medical health.   Past Medical History:  Diagnosis Date  . Cancer (HCC)    squamous cell face removed  . Cellulitis, toe    osteomylitis  . Cholelithiasis   . Diverticulitis   . Hiatal hernia    hx of 25 years ago  . Hyperlipidemia   . Mitral valve prolapse   . Osteoporosis     Past Surgical History:  Procedure Laterality Date  . BREAST BIOPSY     Left,negative   . CHOLECYSTECTOMY     11/02/16 Dr. Harlow Asa  . CHOLECYSTECTOMY N/A 11/02/2016   Procedure: LAPAROSCOPIC CHOLECYSTECTOMY WITH INTRAOPERATIVE CHOLANGIOGRAM;  Surgeon: Armandina Gemma, MD;  Location: [facility];  Service: General;  Laterality: N/A;  . COLONOSCOPY  05/2003   No polyps, hemorrhoids,and diverticulosis   . EYE SURGERY     r eye cataract removed    Prior to Admission medications   Medication Sig Start Date End Date Taking? Authorizing Provider  Cholecalciferol (VITAMIN D-3) 1000 units CAPS Take 1,000 Units by mouth daily with supper.    Yes [provider]  Polyethyl Glycol-Propyl Glycol (LUBRICANT EYE DROPS) 0.4-0.3 % SOLN Place 1 drop into both eyes daily as needed (for dry eyes.).   Yes [provider]  denosumab (PROLIA) 60 MG/ML SOLN injection  Inject 60 mg into the skin every 6 (six) months. Administer in upper arm, thigh, or abdomen    [provider]  Teriparatide, Recombinant, 600 MCG/2.4ML SOLN Inject 0.08 mLs (20 mcg total) into the skin daily. Patient not taking: Reported on 10/21/2016 09/09/16   Hollace Kinnier L, DO    Current Facility-Administered Medications  Medication Dose Route Frequency Provider Last Rate Last Dose  . acetaminophen (TYLENOL) tablet 650 mg  650 mg Oral Q6H PRN Armandina Gemma, MD   650 mg at 11/03/16 6283   Or  . acetaminophen (TYLENOL) suppository 650 mg  650 mg Rectal Q6H PRN Armandina Gemma, MD      . Derrill Memo ON 11/04/2016] cefTRIAXone (ROCEPHIN) 1 g in dextrose 5 % 50 mL IVPB  1 g Intravenous 30 min Pre-Op Macon Lesesne, MD      . HYDROcodone-acetaminophen (NORCO/VICODIN) 5-325 MG per tablet 1-2 tablet  1-2 tablet Oral Q4H PRN Armandina Gemma, MD   1 tablet at 11/02/16 1440  . HYDROmorphone (DILAUDID) injection 1 mg  1 mg Intravenous Q2H PRN Armandina Gemma, MD   1 mg at 11/02/16 1500  . ondansetron (ZOFRAN-ODT) disintegrating tablet 4 mg  4 mg Oral Q6H PRN Armandina Gemma, MD       Or  . ondansetron (ZOFRAN) injection 4 mg  4 mg Intravenous Q6H PRN Armandina Gemma, MD   4 mg at 11/02/16 1455    Allergies as of 10/12/2016  . (No Known Allergies)  Family History  Problem Relation Age of Onset  . Pneumonia Father 38  . Cerebrovascular Accident Mother 39  . Stroke Mother   . Breast cancer Sister 3    Social History   Social History  . Marital status: Widowed    Spouse name: N/A  . Number of children: N/A  . Years of education: N/A   Occupational History  . Not on file.   Social History Main Topics  . Smoking status: Never Smoker  . Smokeless tobacco: Never Used  . Alcohol use 0.6 oz/week    1 Glasses of wine per week     Comment: occasional  . Drug use: No  . Sexual activity: No   Other Topics Concern  . Not on file   Social History Narrative   Dr.Stoneburner, Ophthalmologist    Dr.Peterson, Urologist   Dr.Lomax, Dermatologist      Review of Systems: No cardiopulmonary problems. No blood thinners.  Physical Exam: Vital signs in last 24 hours: Temp:  [97.7 F (36.5 C)-98.2 F (36.8 C)] 97.8 F (36.6 C) (08/02 1400) Pulse Rate:  [73-92] 80 (08/02 1400) Resp:  [17-18] 18 (08/02 1400) BP: (103-130)/(45-65) 118/64 (08/02 1400) SpO2:  [95 %-100 %] 100 % (08/02 1400) Weight:  [49.9 kg (110 lb)] 49.9 kg (110 lb) (08/02 1137) Last BM Date: 11/01/16   This is a pleasant, articulate, rather thin Caucasian female, very well preserved for her age. She is anicteric and without overt pallor. Chest clear, heart without murmur or arrhythmia, abdomen mildly protuberant but soft and without overt tenderness. No peripheral edema, no evident focal neurologic deficits. Mood normal.  Intake/Output from previous day: 08/01 0701 - 08/02 0700 In: 1740 [P.O.:240; I.V.:1500] Out: 1445 [Urine:1425; Blood:20] Intake/Output this shift: Total I/O In: 360 [P.O.:360] Out: 300 [Urine:300]  Lab Results: No results for input(s): WBC, HGB, HCT, PLT in the last 72 hours. BMET No results for input(s): NA, K, CL, CO2, GLUCOSE, BUN, CREATININE, CALCIUM in the last 72 hours. LFT No results for input(s): PROT, ALBUMIN, AST, ALT, ALKPHOS, BILITOT, BILIDIR, IBILI in the last 72 hours. PT/INR No results for input(s): LABPROT, INR in the last 72 hours.  Studies/Results: Dg Cholangiogram Operative  Result Date: 11/02/2016 CLINICAL DATA:  76 year old female undergoing laparoscopic cholecystectomy. EXAM: INTRAOPERATIVE CHOLANGIOGRAM TECHNIQUE: Cholangiographic images from the C-arm fluoroscopic device were submitted for interpretation post-operatively. Please see the procedural report for the amount of contrast and the fluoroscopy time utilized. COMPARISON:  Abdominal ultrasound 09/15/2016 FINDINGS: Intraoperative cholangiogram at the time of laparoscopic cholecystectomy. 2 cine clips are submitted  for review. The images demonstrate cannulation of the cystic duct remanent and opacification of the biliary tree. No significant biliary ductal dilatation, stenosis or stricture. There is a suggestion of a rounded filling defect in the distal common bile duct. However, contrast material does pass through the ampulla and into the duodenum. IMPRESSION: Intraoperative cholangiogram concerning for a small nonobstructing distal common bile duct stone or sludge. Alternately, this could represent an impression from a prominent papilloma. These results were called by telephone at the time of interpretation on 11/02/2016 at 10:03 am to Dr. Armandina Gemma , who verbally acknowledged these results. Electronically Signed   By: Jacqulynn Cadet M.D.   On: 11/02/2016 10:04   Mr Abdomen With Mrcp W Contrast  Result Date: 11/03/2016 CLINICAL DATA:  Evaluate for common bile duct stone following cholecystectomy. EXAM: MRI ABDOMEN WITHOUT CONTRAST  (INCLUDING MRCP) TECHNIQUE: Multiplanar multisequence MR imaging of the abdomen was performed. Heavily T2-weighted  images of the biliary and pancreatic ducts were obtained, and three-dimensional MRCP images were rendered by post processing. COMPARISON:  Intraoperative cholangiogram 11/02/2016 FINDINGS: Lower chest:  Small RIGHT pleural effusion Hepatobiliary: There is no intrahepatic duct dilatation. The common hepatic duct and common bile duct are normal caliber measuring 5 mm and 5 mm respectively. Within the mid common bile duct at the level of the cystic duct remnant, there is a small filling defect measuring 3 mm identified on the heavily T2 weighted MRCP sequences (image 53, series 4). This small filling defect is also identified on the axial T2 weighted images (image 27, series 3) and image 27, series 12. The more distal common bile duct leading to the pancreatic head is normal caliber without evidence of obstructing lesion. There is no focal hepatic lesion. Patient status post  cholecystectomy with expected edema and fluid in the gallbladder fossa. Pancreas: Normal pancreatic parenchymal intensity. No ductal dilatation or inflammation. Spleen: Normal spleen. Adrenals/urinary tract: Adrenal glands and kidneys are normal. Stomach/Bowel: Stomach and limited of the small bowel is unremarkable Vascular/Lymphatic: Abdominal aortic normal caliber. No retroperitoneal periportal lymphadenopathy. Musculoskeletal: No aggressive osseous lesion IMPRESSION: 1. Probable small 3 mm stone within the mid common bile duct without obstruction. 2. No evidence of common bile duct obstruction. No stones present in the distal common bile duct through the pancreatic head. No proximal duct dilatation. 3. Postcholecystectomy without complication. Electronically Signed   By: Suzy Bouchard M.D.   On: 11/03/2016 15:24    Impression: Asymptomatic choledocholithiasis, status post cholecystectomy yesterday.  Plan: The case was discussed with my partner, Dr. Watt Climes who has a particular interest in biliary cases. It is his opinion, as well as mine, that even a small common duct stone like this is potentially hazardous for the patient, in particular for the risk of causing gallstone pancreatitis, so it is best removed.    I discussed the nature, technique, purpose, and risks of ERCP with the patient and her son who is at the bedside. Risks discussed included a roughly 3% risk of pancreatitis which can rarely be fatal, bleeding, perforation, infection, and anesthesia risk. The patient seems to have a good understanding, was offered the opportunity to ask questions, and is agreeable to proceed. She is aware that the procedure will most likely be done by my partner, Dr. [name].   LOS: 0 days   Darreon Lutes V  11/03/2016, 5:49 PM   Pager [phone] If no answer or after 5 PM call [phone]

## 2016-11-04 NOTE — Discharge Instructions (Signed)
°  [facility] ° °LAPAROSCOPIC SURGERY:  POST-OP INSTRUCTIONS ° °Always review your discharge instruction sheet given to you by the facility where your surgery was performed. ° °A prescription for pain medication may be given to you upon discharge.  Take your pain medication as prescribed.  If narcotic pain medicine is not needed, then you may take acetaminophen (Tylenol) or ibuprofen (Advil) as needed. ° °Take your usually prescribed medications unless otherwise directed. ° °If you need a refill on your pain medication, please contact your pharmacy.  They will contact our office to request authorization. Prescriptions will not be filled after 5 P.M. or on weekends. ° °You should follow a light diet the first few days after arrival home, such as soup and crackers or toast.  Be sure to include plenty of fluids daily. ° °Most patients will experience some swelling and bruising in the area of the incisions.  Ice packs will help.  Swelling and bruising can take several days to resolve.  ° °It is common to experience some constipation after surgery.  Increasing fluid intake and taking a stool softener (such as Colace) will usually help or prevent this problem from occurring.  A mild laxative (Milk of Magnesia or Miralax) should be taken according to package instructions if there has been no bowel movement after 48 hours. ° °You will have steri-strips and a gauze dressing over your incisions.  You may remove the gauze bandage on the second day after surgery, and you may shower at that time.  Leave your steri-strips (small skin tapes) in place directly over the incision.  These strips should remain on the skin for 5-7 days and then be removed.  You may get them wet in the shower and pat them dry. ° °Any sutures or staples will be removed at the office during your follow-up visit. ° °ACTIVITIES:  You may resume regular (light) daily activities beginning the next day - such as daily self-care, walking,  climbing stairs - gradually increasing activities as tolerated.  You may have sexual intercourse when it is comfortable.  Refrain from any heavy lifting or straining until approved by your doctor. ° °You may drive when you are no longer taking prescription pain medication, you can comfortably wear a seatbelt, and you can safely maneuver your car and apply brakes. ° °You should see your doctor in the office for a follow-up appointment approximately 2-3 weeks after your surgery.  Make sure that you call for this appointment within a day or two after you arrive home to insure a convenient appointment time. ° °WHEN TO CALL YOUR DOCTOR: °1. Fever over 101.0 °2. Inability to urinate °3. Continued bleeding from incision °4. Increased pain, redness, or drainage from the incision °5. Increasing abdominal pain ° °The clinic staff is available to answer your questions during regular business hours.  Please don’t hesitate to call and ask to speak to one of the nurses for clinical concerns.  If you have a medical emergency, go to the nearest emergency room or call 911.  A surgeon from [facility] is always on call for the hospital. ° °Zanna Hawn M. Calie Buttrey, MD, FACS °[facility] °Office: [phone] °Toll Free:  [phone] °FAX [phone] ° °Website: [email or web address] °

## 2016-11-04 NOTE — Anesthesia Preprocedure Evaluation (Addendum)
Anesthesia Evaluation  Patient identified by MRN, date of birth, ID band Patient awake    Reviewed: Allergy & Precautions, NPO status , Patient's Chart, lab work & pertinent test results  History of Anesthesia Complications (+) PONV  Airway Mallampati: II  TM Distance: >3 FB Neck ROM: Full    Dental no notable dental hx.    Pulmonary neg pulmonary ROS,    Pulmonary exam normal breath sounds clear to auscultation       Cardiovascular negative cardio ROS Normal cardiovascular exam Rhythm:Regular Rate:Normal     Neuro/Psych negative neurological ROS  negative psych ROS   GI/Hepatic Neg liver ROS, GERD  ,  Endo/Other  negative endocrine ROS  Renal/GU negative Renal ROS  negative genitourinary   Musculoskeletal negative musculoskeletal ROS (+)   Abdominal   Peds negative pediatric ROS (+)  Hematology negative hematology ROS (+)   Anesthesia Other Findings   Reproductive/Obstetrics negative OB ROS                             Anesthesia Physical  Anesthesia Plan  ASA: II  Anesthesia Plan: General   Post-op Pain Management:    Induction: Intravenous  PONV Risk Score and Plan: 1 and Ondansetron, Dexamethasone and Scopolamine patch - Pre-op  Airway Management Planned: Oral ETT  Additional Equipment:   Intra-op Plan:   Post-operative Plan: Extubation in OR  Informed Consent: I have reviewed the patients History and Physical, chart, labs and discussed the procedure including the risks, benefits and alternatives for the proposed anesthesia with the patient or authorized representative who has indicated his/her understanding and acceptance.   Dental advisory given  Plan Discussed with: CRNA and Surgeon  Anesthesia Plan Comments:         Anesthesia Quick Evaluation

## 2016-11-04 NOTE — Brief Op Note (Signed)
11/02/2016 - 11/04/2016  1:18 PM  PATIENT:  Adriana [name]  76 y.o. female  PRE-OPERATIVE DIAGNOSIS:  common bile duct stone  POST-OPERATIVE DIAGNOSIS:  sphincerotomy,balloon dilation 9 & 12, stone   PROCEDURE:  Procedure(s): ENDOSCOPIC RETROGRADE CHOLANGIOPANCREATOGRAPHY (ERCP) WITH PROPOFOL (N/A)  SURGEON:  Surgeon(s) and Role:    [name], MD - Primary  PHYSICIAN ASSISTANT:   ASSISTANTS: none   ANESTHESIA:   MAC  EBL:  No intake/output data recorded.  BLOOD ADMINISTERED:none  DRAINS: none   LOCAL MEDICATIONS USED:  NONE  SPECIMEN: None  DISPOSITION OF SPECIMEN:  N/A  COUNTS:  YES  TOURNIQUET:  * No tourniquets in log *  DICTATION: .Dragon Dictation  PLAN OF CARE: Admit to inpatient   PATIENT DISPOSITION:  PACU - hemodynamically stable.   Delay start of Pharmacological VTE agent (>24hrs) due to surgical blood loss or risk of bleeding: no

## 2016-11-04 NOTE — Anesthesia Procedure Notes (Signed)
Procedure Name: Intubation Date/Time: 11/04/2016 12:40 PM Performed by: Glory Buff Pre-anesthesia Checklist: Patient identified, Emergency Drugs available, Suction available and Patient being monitored Patient Re-evaluated:Patient Re-evaluated prior to induction Oxygen Delivery Method: Circle system utilized Preoxygenation: Pre-oxygenation with 100% oxygen Induction Type: IV induction Ventilation: Mask ventilation without difficulty Laryngoscope Size: Miller and 3 Grade View: Grade I Tube type: Oral Tube size: 7.0 mm Number of attempts: 1 Airway Equipment and Method: Stylet and Oral airway Placement Confirmation: ETT inserted through vocal cords under direct vision,  positive ETCO2 and breath sounds checked- equal and bilateral Secured at: 20 cm Tube secured with: Tape Dental Injury: Teeth and Oropharynx as per pre-operative assessment

## 2016-11-04 NOTE — Anesthesia Postprocedure Evaluation (Signed)
Anesthesia Post Note  Patient: Adriana [name]  Procedure(s) Performed: Procedure(s) (LRB): ENDOSCOPIC RETROGRADE CHOLANGIOPANCREATOGRAPHY (ERCP) WITH PROPOFOL (N/A)     Patient location during evaluation: PACU Anesthesia Type: General Level of consciousness: awake and alert Pain management: pain level controlled Vital Signs Assessment: post-procedure vital signs reviewed and stable Respiratory status: spontaneous breathing, nonlabored ventilation, respiratory function stable and patient connected to nasal cannula oxygen Cardiovascular status: stable and blood pressure returned to baseline Anesthetic complications: no    Last Vitals:  Vitals:   11/04/16 1330 11/04/16 1350  BP: 120/63   Pulse: 90   Resp: 15 (!) 22  Temp:      Last Pain:  Vitals:   11/04/16 1328  TempSrc: Oral  PainSc:                  [name]

## 2016-11-04 NOTE — Transfer of Care (Signed)
Immediate Anesthesia Transfer of Care Note  Patient: Adriana [name]  Procedure(s) Performed: Procedure(s): ENDOSCOPIC RETROGRADE CHOLANGIOPANCREATOGRAPHY (ERCP) WITH PROPOFOL (N/A)  Patient Location: PACU  Anesthesia Type:General  Level of Consciousness: awake, alert  and oriented  Airway & Oxygen Therapy: Patient Spontanous Breathing and Patient connected to face mask oxygen  Post-op Assessment: Report given to RN and Post -op Vital signs reviewed and stable  Post vital signs: Reviewed and stable  Last Vitals:  Vitals:   11/04/16 0535 11/04/16 1210  BP: 123/62 (!) 141/70  Pulse: (!) 102 93  Resp: 18 (!) 23  Temp: 37.3 C 36.8 C    Last Pain:  Vitals:   11/04/16 1210  TempSrc: Oral  PainSc: 3       Patients Stated Pain Goal: 3 (63/01/60 1093)  Complications: No apparent anesthesia complications

## 2016-11-04 NOTE — Discharge Summary (Signed)
Physician Discharge Summary [facility]  Patient ID: Adriana [name] MRN: [id number] DOB/AGE: [birth year] 76 y.o.  Admit date: 11/02/2016 Discharge date: 11/04/2016  Admission Diagnoses:  Chronic cholecystitis, cholelithiasis  Discharge Diagnoses:  Principal Problem:   Calculus of gallbladder without cholecystitis without obstruction Active Problems:   Cholelithiasis with chronic cholecystitis   Discharged Condition: good  Hospital Course: Patient was admitted for observation following gallbladder surgery.  Post op course was complicated by CBD stone.  This required MRCP for confirmation, followed by GI consultation and ERCP on 11/04/2016.  Procedure went well without complication.  Pain was well controlled.  Tolerated diet.  Patient was prepared for discharge home on POD#2.  Consults: GI  Treatments: surgery: lap chole with IOC, ERCP with stone extraction  Discharge Exam: Blood pressure (!) 142/68, pulse 90, temperature 98.3 F (36.8 C), temperature source Oral, resp. rate 20, height 5\' 5"  (1.651 m), weight 49.9 kg (110 lb), SpO2 97 %. HEENT - clear Neck - soft Chest - clear bilaterally Cor - RRR Abd - soft, minimal tenderness; dressings dry and intact  Disposition: Home  Discharge Instructions    Diet - low sodium heart healthy    Complete by:  As directed    Discharge instructions    Complete by:  As directed    [facility]  LAPAROSCOPIC SURGERY:  POST-OP INSTRUCTIONS  Always review your discharge instruction sheet given to you by the facility where your surgery was performed.  A prescription for pain medication may be given to you upon discharge.  Take your pain medication as prescribed.  If narcotic pain medicine is not needed, then you may take acetaminophen (Tylenol) or ibuprofen (Advil) as needed.  Take your usually prescribed medications unless otherwise directed.  If you need a refill on your pain medication, please contact  your pharmacy.  They will contact our office to request authorization. Prescriptions will not be filled after 5 P.M. or on weekends.  You should follow a light diet the first few days after arrival home, such as soup and crackers or toast.  Be sure to include plenty of fluids daily.  Most patients will experience some swelling and bruising in the area of the incisions.  Ice packs will help.  Swelling and bruising can take several days to resolve.   It is common to experience some constipation after surgery.  Increasing fluid intake and taking a stool softener (such as Colace) will usually help or prevent this problem from occurring.  A mild laxative (Milk of Magnesia or Miralax) should be taken according to package instructions if there has been no bowel movement after 48 hours.  You will have steri-strips and a gauze dressing over your incisions.  You may remove the gauze bandage on the second day after surgery, and you may shower at that time.  Leave your steri-strips (small skin tapes) in place directly over the incision.  These strips should remain on the skin for 5-7 days and then be removed.  You may get them wet in the shower and pat them dry.  Any sutures or staples will be removed at the office during your follow-up visit.  ACTIVITIES:  You may resume regular (light) daily activities beginning the next day - such as daily self-care, walking, climbing stairs - gradually increasing activities as tolerated.  You may have sexual intercourse when it is comfortable.  Refrain from any heavy lifting or straining until approved by your doctor.  You may drive when  you are no longer taking prescription pain medication, you can comfortably wear a seatbelt, and you can safely maneuver your car and apply brakes.  You should see your doctor in the office for a follow-up appointment approximately 2-3 weeks after your surgery.  Make sure that you call for this appointment within a day or two after you arrive  home to insure a convenient appointment time.  WHEN TO CALL YOUR DOCTOR: Fever over 101.0 Inability to urinate Continued bleeding from incision Increased pain, redness, or drainage from the incision Increasing abdominal pain  The clinic staff is available to answer your questions during regular business hours.  Please don't hesitate to call and ask to speak to one of the nurses for clinical concerns.  If you have a medical emergency, go to the nearest emergency room or call 911.  A surgeon from [facility] is always on call for the hospital.  [name], MD, [facility] Office: [place] Free:  [place] [phone]  Website: [email or web address]   Increase activity slowly    Complete by:  As directed      Allergies as of 11/04/2016      Reactions   Adhesive [tape] Rash, Other (See Comments)   Skin irritation with extended exposure.   Latex Rash, Other (See Comments)   Blistering with extended exposure      Medication List    TAKE these medications   denosumab 60 MG/ML Soln injection Commonly known as:  PROLIA Inject 60 mg into the skin every 6 (six) months. Administer in upper arm, thigh, or abdomen   LUBRICANT EYE DROPS 0.4-0.3 % Soln Generic drug:  Polyethyl Glycol-Propyl Glycol Place 1 drop into both eyes daily as needed (for dry eyes.).   Teriparatide (Recombinant) 600 MCG/2.4ML Soln Inject 0.08 mLs (20 mcg total) into the skin daily.   Vitamin D-3 1000 units Caps Take 1,000 Units by mouth daily with supper.      Follow-up Information    Armandina Gemma, MD. Schedule an appointment as soon as possible for a visit in 3 week(s).   Specialty:  General Surgery Contact information: [address] [place] [address] [phone]           [name], MD, [facility] Office: [phone]   Signed: [name] 11/04/2016, 3:50 PM

## 2016-11-04 NOTE — Op Note (Signed)
The ampulla appeared unremarkable. CBD was deeply cannulated, cholangiogram was performed, a small filling defect noted in distal CBD. Rest of the biliary tree appeared unremarkable, CBD was 8-9 mm in size. Sphincterotomy was performed, there is no oozing noted post sphincterotomy. CBD was swept several times with a 12 mm balloon. One small black stone was removed from the CBD. The scope was withdrawn and the procedure was terminated. The pancreatic duct was never injected or cannulated during the entire procedure.  [name], M.D.

## 2016-11-05 LAB — ABO/RH: ABO/RH(D): O POS

## 2016-11-07 ENCOUNTER — Encounter: Payer: Self-pay | Admitting: Internal Medicine

## 2016-11-07 ENCOUNTER — Ambulatory Visit (INDEPENDENT_AMBULATORY_CARE_PROVIDER_SITE_OTHER): Payer: Medicare Other | Admitting: Internal Medicine

## 2016-11-07 VITALS — BP 142/78 | HR 99 | Temp 98.6°F | Ht 65.0 in | Wt 108.4 lb

## 2016-11-07 DIAGNOSIS — Z9049 Acquired absence of other specified parts of digestive tract: Secondary | ICD-10-CM | POA: Diagnosis not present

## 2016-11-07 DIAGNOSIS — L245 Irritant contact dermatitis due to other chemical products: Secondary | ICD-10-CM

## 2016-11-07 MED ORDER — CETIRIZINE HCL 10 MG PO TABS: 10.0000 mg | ORAL_TABLET | Freq: Every day | ORAL | 0 refills | Status: DC

## 2016-11-07 NOTE — Patient Instructions (Addendum)
Use cool compresses on her abdomen.   Take zyrtec 10mg  daily until rash resolves.   I'll check with your surgeon and gastroenterologist about what they put on your skin (suspect betadine).  We will then add it to your allergies.

## 2016-11-07 NOTE — Progress Notes (Signed)
Thanks, sounds like the prep towels were the problem based upon the very clear delineation of the rash.  We'll add it to her adhesive tape allergy.  Adriana [name]

## 2016-11-07 NOTE — Progress Notes (Signed)
Location:  [facility] clinic Provider: Shantale Holtmeyer L. Mariea [name], D.O., C.M.D.  Code Status: DNR, does have living will and hcpoa, but must update and bring copy Goals of Care:  Advanced Directives 11/04/2016  Does Patient Have a Medical Advance Directive? No  Type of Advance Directive -  Does patient want to make changes to medical advance directive? -  Copy of [place] in Chart? -  Would patient like information on creating a medical advance directive? -     Chief Complaint  Patient presents with  . Acute Visit    Rash since Saturday, no change in daily routine, patient has not used any OTC treatment. FYI gallbladder surgery last Wed, MRI last Thursday.     HPI: Patient is a 76 y.o. female seen today for an acute visit for rash as above.    Saturday night, she started itching and it's very uncomfortable.  Chest to abdomen covered with small hives.  Raised, red and pruritic.  Took 2 benadryls the first night which did not help a bit.  She has not tried anything else.  She reports that the rash is over her abdomen and only wraps around her sides.  It's nowhere else.  She has been able to rest eventually, but it is irritating.  I reviewed the op notes, but did not see what was used to prep the skin.  She reports it was yellow-orange so likely betadine from her lap chole.  She is allergic to latex, but this was on her record.   Past Medical History:  Diagnosis Date  . Cancer (HCC)    squamous cell face removed  . Cellulitis, toe    osteomylitis  . Cholelithiasis   . Diverticulitis   . Hiatal hernia    hx of 25 years ago  . Hyperlipidemia   . Mitral valve prolapse   . Osteoporosis   . PONV (postoperative nausea and vomiting)     Past Surgical History:  Procedure Laterality Date  . BREAST BIOPSY     Left,negative   . CHOLECYSTECTOMY     11/02/16 Dr. Harlow Asa  . CHOLECYSTECTOMY N/A 11/02/2016   Procedure: LAPAROSCOPIC CHOLECYSTECTOMY WITH INTRAOPERATIVE CHOLANGIOGRAM;   Surgeon: Armandina Gemma, MD;  Location: [facility]S;  Service: General;  Laterality: N/A;  . COLONOSCOPY  05/2003   No polyps, hemorrhoids,and diverticulosis   . ENDOSCOPIC RETROGRADE CHOLANGIOPANCREATOGRAPHY (ERCP) WITH PROPOFOL N/A 11/04/2016   Procedure: ENDOSCOPIC RETROGRADE CHOLANGIOPANCREATOGRAPHY (ERCP) WITH PROPOFOL;  Surgeon: [name], MD;  Location: [facility] ENDOSCOPY;  Service: Gastroenterology;  Laterality: N/A;  . EYE SURGERY     r eye cataract removed    Allergies  Allergen Reactions  . Adhesive [Tape] Rash and Other (See Comments)    Skin irritation with extended exposure.  . Latex Rash and Other (See Comments)    Blistering with extended exposure    Allergies as of 11/07/2016      Reactions   Adhesive [tape] Rash, Other (See Comments)   Skin irritation with extended exposure.   Latex Rash, Other (See Comments)   Blistering with extended exposure      Medication List       Accurate as of 11/07/16 11:15 AM. Always use your most recent med list.          denosumab 60 MG/ML Soln injection Commonly known as:  PROLIA Inject 60 mg into the skin every 6 (six) months. Administer in upper arm, thigh, or abdomen   LUBRICANT EYE DROPS 0.4-0.3 % Soln Generic  drug:  Polyethyl Glycol-Propyl Glycol Place 1 drop into both eyes daily as needed (for dry eyes.).   Vitamin D-3 1000 units Caps Take 1,000 Units by mouth daily with supper.       Review of Systems:  Review of Systems  Constitutional: Negative for chills, fever and malaise/fatigue.  Respiratory: Negative for shortness of breath.   Skin: Positive for itching and rash.  Neurological: Negative for weakness.    Health Maintenance  Topic Date Due  . INFLUENZA VACCINE  12/02/2016 (Originally 11/02/2016)  . MAMMOGRAM  09/02/2017  . TETANUS/TDAP  12/15/2023  . DEXA SCAN  Completed  . PNA vac Low Risk Adult  Completed    Physical Exam: Vitals:   11/07/16 1110  BP: (!) 142/78  Pulse: 99  Temp: 98.6 F (37 C)    TempSrc: Oral  SpO2: 96%  Weight: 108 lb 6.4 oz (49.2 kg)  Height: 5\' 5"  (1.651 m)   Body mass index is 18.04 kg/m. Physical Exam  Constitutional: She is oriented to person, place, and time. She appears well-developed and well-nourished. No distress.  Pulmonary/Chest: Effort normal.  Abdominal: Soft. Bowel sounds are normal. She exhibits no distension. There is no tenderness.  Lap chole incision sites clean and dry w/o drainage, has two darker red stripes on either side of abdomen vertically over ribs, then entire abdomen is covered with erythematous tiny papules that are pruritic, they stop posterior to midaxillary line in a vertical line (see pic)  Neurological: She is alert and oriented to person, place, and time.  Skin: Capillary refill takes less than 2 seconds.     Psychiatric: She has a normal mood and affect.    Labs reviewed: Basic Metabolic Panel:  Recent Labs  06/06/16 0822 10/24/16 0837 11/04/16 0528  NA 141 138 139  K 4.5 4.6 3.7  CL 105 106 106  CO2 29 26 25   GLUCOSE 99 104* 103*  BUN 15 16 12   CREATININE 0.63 0.64 0.59  CALCIUM 9.8 8.8* 8.2*  TSH 1.66  --   --    Liver Function Tests:  Recent Labs  09/09/16 1035 10/24/16 0837 11/04/16 0528  AST 14 21 67*  ALT 13 15 181*  ALKPHOS 138* 78 77  BILITOT 0.4 0.6 0.7  PROT 6.9 6.9 6.6  ALBUMIN 4.3 4.2 3.7    Recent Labs  09/09/16 1035  LIPASE 21  AMYLASE 61   No results for input(s): AMMONIA in the last 8760 hours. CBC:  Recent Labs  06/06/16 0822 10/24/16 0837 11/04/16 0528  WBC 6.6 7.2 14.7*  NEUTROABS 4,290  --   --   HGB 13.4 12.7 12.2  HCT 41.5 38.9 36.9  MCV 91.8 94.2 92.5  PLT 289 265 224   Lipid Panel:  Recent Labs  06/06/16 0822  CHOL 186  HDL 75  LDLCALC 92  TRIG 95  CHOLHDL 2.5   No results found for: HGBA1C  Procedures since last visit: Dg Cholangiogram Operative  Result Date: 11/02/2016 CLINICAL DATA:  76 year old female undergoing laparoscopic  cholecystectomy. EXAM: INTRAOPERATIVE CHOLANGIOGRAM TECHNIQUE: Cholangiographic images from the C-arm fluoroscopic device were submitted for interpretation post-operatively. Please see the procedural report for the amount of contrast and the fluoroscopy time utilized. COMPARISON:  Abdominal ultrasound 09/15/2016 FINDINGS: Intraoperative cholangiogram at the time of laparoscopic cholecystectomy. 2 cine clips are submitted for review. The images demonstrate cannulation of the cystic duct remanent and opacification of the biliary tree. No significant biliary ductal dilatation, stenosis or stricture. There is a  suggestion of a rounded filling defect in the distal common bile duct. However, contrast material does pass through the ampulla and into the duodenum. IMPRESSION: Intraoperative cholangiogram concerning for a small nonobstructing distal common bile duct stone or sludge. Alternately, this could represent an impression from a prominent papilloma. These results were called by telephone at the time of interpretation on 11/02/2016 at 10:03 am to Dr. Armandina Gemma , who verbally acknowledged these results. Electronically Signed   By: Jacqulynn Cadet M.D.   On: 11/02/2016 10:04   Dg Ercp  Result Date: 11/04/2016 CLINICAL DATA:  CBD stone removal EXAM: ERCP TECHNIQUE: Multiple spot images obtained with the fluoroscopic device and submitted for interpretation post-procedure. COMPARISON:  MRCP 11/03/2016 FINDINGS: A series of fluoroscopic spot images document endoscopic catheterization and opacification of the CBD. Images document passage of a balloon tipped catheter through the CBD. Cholecystectomy clips. The intrahepatic bile ducts are incompletely opacified, appearing mildly distended centrally. IMPRESSION: 1. Endoscopic CBD cannulation and intervention. These images were submitted for radiologic interpretation only. Please see the procedural report for the amount of contrast and the fluoroscopy time utilized.  Electronically Signed   By: Lucrezia Europe M.D.   On: 11/04/2016 13:28   Mr Abdomen With Mrcp W Contrast  Result Date: 11/03/2016 CLINICAL DATA:  Evaluate for common bile duct stone following cholecystectomy. EXAM: MRI ABDOMEN WITHOUT CONTRAST  (INCLUDING MRCP) TECHNIQUE: Multiplanar multisequence MR imaging of the abdomen was performed. Heavily T2-weighted images of the biliary and pancreatic ducts were obtained, and three-dimensional MRCP images were rendered by post processing. COMPARISON:  Intraoperative cholangiogram 11/02/2016 FINDINGS: Lower chest:  Small RIGHT pleural effusion Hepatobiliary: There is no intrahepatic duct dilatation. The common hepatic duct and common bile duct are normal caliber measuring 5 mm and 5 mm respectively. Within the mid common bile duct at the level of the cystic duct remnant, there is a small filling defect measuring 3 mm identified on the heavily T2 weighted MRCP sequences (image 53, series 4). This small filling defect is also identified on the axial T2 weighted images (image 27, series 3) and image 27, series 12. The more distal common bile duct leading to the pancreatic head is normal caliber without evidence of obstructing lesion. There is no focal hepatic lesion. Patient status post cholecystectomy with expected edema and fluid in the gallbladder fossa. Pancreas: Normal pancreatic parenchymal intensity. No ductal dilatation or inflammation. Spleen: Normal spleen. Adrenals/urinary tract: Adrenal glands and kidneys are normal. Stomach/Bowel: Stomach and limited of the small bowel is unremarkable Vascular/Lymphatic: Abdominal aortic normal caliber. No retroperitoneal periportal lymphadenopathy. Musculoskeletal: No aggressive osseous lesion IMPRESSION: 1. Probable small 3 mm stone within the mid common bile duct without obstruction. 2. No evidence of common bile duct obstruction. No stones present in the distal common bile duct through the pancreatic head. No proximal duct  dilatation. 3. Postcholecystectomy without complication. Electronically Signed   By: Suzy Bouchard M.D.   On: 11/03/2016 15:24    Assessment/Plan 1. Irritant contact dermatitis due to other chemical products - cetirizine (ZYRTEC) 10 MG tablet; Take 1 tablet (10 mg total) by mouth daily. Until rash resolves  Dispense: 30 tablet; Refill: 0 - use until resolved -if still really itchy by Wednesday, call back for steroid dose pack -pt has latex allergy also -suspect betadine allergy--will check with surgeon about what was used in that distribution  2. S/P laparoscopic cholecystectomy -suspect allergy to betadine based on area covered with rash--was delayed from Wed to Sat however, but she does  not recall anything on her abdomen, lower chest since primary surgery on wed.    Labs/tests ordered:  No orders of the defined types were placed in this encounter.  Next appt:  02/09/2017  Adonis Yim L. Sindhu Nguyen, D.O. [facility] [address], [place] [address] Cell Phone (Mon-Fri 8am-5pm):  [phone] On Call:  [phone] & follow prompts after 5pm & weekends Office Phone:  [phone] Office Fax:  [phone]

## 2017-02-09 ENCOUNTER — Encounter: Payer: Self-pay | Admitting: Internal Medicine

## 2017-02-09 ENCOUNTER — Ambulatory Visit: Payer: Medicare Other | Admitting: Internal Medicine

## 2017-02-09 VITALS — BP 120/70 | HR 72 | Temp 98.1°F | Wt 111.0 lb

## 2017-02-09 DIAGNOSIS — R634 Abnormal weight loss: Secondary | ICD-10-CM

## 2017-02-09 DIAGNOSIS — R739 Hyperglycemia, unspecified: Secondary | ICD-10-CM

## 2017-02-09 DIAGNOSIS — M810 Age-related osteoporosis without current pathological fracture: Secondary | ICD-10-CM | POA: Diagnosis not present

## 2017-02-09 DIAGNOSIS — Z23 Encounter for immunization: Secondary | ICD-10-CM

## 2017-02-09 DIAGNOSIS — Z888 Allergy status to other drugs, medicaments and biological substances status: Secondary | ICD-10-CM | POA: Insufficient documentation

## 2017-02-09 NOTE — Addendum Note (Signed)
Addended by: Logan Bores on: 02/09/2017 08:52 AM   Modules accepted: Orders

## 2017-02-09 NOTE — Progress Notes (Signed)
Location:  [facility] clinic Provider:  Khylon Davies L. Mariea Clonts, D.O., C.M.D.  Code Status: DNR, has been discussed and pt clear about wishes Goals of Care:  Advanced Directives 02/09/2017  Does Patient Have a Medical Advance Directive? Yes  Type of Advance Directive [place]  Does patient want to make changes to medical advance directive? No - Patient declined  Copy of [place] in Chart? Yes  Would patient like information on creating a medical advance directive? -     Chief Complaint  Patient presents with  . Medical Management of Chronic Issues    66mth follow-up    HPI: Patient is a 76 y.o. female seen today for medical management of chronic diseases.    Reports being the best she's been in a while. No longer nauseous.  Appetite better.  111 lbs here.  Has been 107-108 lbs at home without clothes.  3 lb weight gain here.  Energy is good.    No new concerns.  Walks with friends, does yoga, church, and belongs to a lot of organizations.  Has a dog.  Has children she sees now and then.  Not lonely.  Is happy at home.  Not ready for a [place] or long term care.    Took zyrtec for 5 days and by then it was gone.  Lasted about one week.  Surgeon thought the reaction was to the chlorhexidine.    She does not use the eye drops which show some cross reactivity with the chlorhexidine.    No GERD symptoms of dry cough, acid indigestion.    Past Medical History:  Diagnosis Date  . Cancer (HCC)    squamous cell face removed  . Cellulitis, toe    osteomylitis  . Cholelithiasis   . Diverticulitis   . Hiatal hernia    hx of 25 years ago  . Hyperlipidemia   . Mitral valve prolapse   . Osteoporosis   . PONV (postoperative nausea and vomiting)     Past Surgical History:  Procedure Laterality Date  . BREAST BIOPSY     Left,negative   . CHOLECYSTECTOMY     11/02/16 Dr. Harlow Asa  . COLONOSCOPY  05/2003   No polyps, hemorrhoids,and diverticulosis   . EYE  SURGERY     r eye cataract removed    Allergies  Allergen Reactions  . Adhesive [Tape] Rash and Other (See Comments)    Skin irritation with extended exposure.  . Latex Rash and Other (See Comments)    Blistering with extended exposure    Outpatient Encounter Medications as of 02/09/2017  Medication Sig  . Cholecalciferol (VITAMIN D-3) 1000 units CAPS Take 1,000 Units by mouth daily with supper.   . denosumab (PROLIA) 60 MG/ML SOLN injection Inject 60 mg into the skin every 6 (six) months. Administer in upper arm, thigh, or abdomen  . Polyethyl Glycol-Propyl Glycol (LUBRICANT EYE DROPS) 0.4-0.3 % SOLN Place 1 drop into both eyes daily as needed (for dry eyes.).  . [DISCONTINUED] cetirizine (ZYRTEC) 10 MG tablet Take 1 tablet (10 mg total) by mouth daily. Until rash resolves   No facility-administered encounter medications on file as of 02/09/2017.     Review of Systems:  Review of Systems  Constitutional: Negative for chills, fever, malaise/fatigue and weight loss.       Wt stabilized  HENT: Negative for congestion and hearing loss.   Eyes: Positive for double vision. Negative for blurred vision.  Has glasses with prisms  Respiratory: Negative for cough and shortness of breath.   Cardiovascular: Negative for chest pain, palpitations and leg swelling.  Gastrointestinal: Negative for abdominal pain, blood in stool, constipation, diarrhea, heartburn, melena, nausea and vomiting.  Genitourinary: Negative for dysuria.  Musculoskeletal: Negative for falls and joint pain.  Skin: Negative for itching and rash.  Neurological: Negative for dizziness, loss of consciousness and weakness.  Psychiatric/Behavioral: Negative for depression and memory loss. The patient is not nervous/anxious and does not have insomnia.        Doing well with support of friends and family since her husband's death    Health Maintenance  Topic Date Due  . INFLUENZA VACCINE  11/02/2016  . MAMMOGRAM   09/02/2017  . TETANUS/TDAP  12/15/2023  . DEXA SCAN  Completed  . PNA vac Low Risk Adult  Completed    Physical Exam: Vitals:   02/09/17 0736  BP: 120/70  Pulse: 72  Temp: 98.1 F (36.7 C)  TempSrc: Oral  SpO2: 95%  Weight: 111 lb (50.3 kg)   Body mass index is 18.47 kg/m. Physical Exam  Constitutional: She is oriented to person, place, and time. She appears well-developed. No distress.  Thin white female  HENT:  Head: Normocephalic and atraumatic.  Neck: Neck supple.  Cardiovascular: Normal rate, regular rhythm, normal heart sounds and intact distal pulses.  Pulmonary/Chest: Effort normal and breath sounds normal. No respiratory distress.  Abdominal: Soft. Bowel sounds are normal.  Musculoskeletal: Normal range of motion.  Neurological: She is alert and oriented to person, place, and time. No cranial nerve deficit.  Skin: Skin is warm and dry.  Psychiatric: She has a normal mood and affect. Her behavior is normal. Judgment and thought content normal.    Labs reviewed: Basic Metabolic Panel: Recent Labs    06/06/16 0822 10/24/16 0837 11/04/16 0528  NA 141 138 139  K 4.5 4.6 3.7  CL 105 106 106  CO2 29 26 25   GLUCOSE 99 104* 103*  BUN 15 16 12   CREATININE 0.63 0.64 0.59  CALCIUM 9.8 8.8* 8.2*  TSH 1.66  --   --    Liver Function Tests: Recent Labs    09/09/16 1035 10/24/16 0837 11/04/16 0528  AST 14 21 67*  ALT 13 15 181*  ALKPHOS 138* 78 77  BILITOT 0.4 0.6 0.7  PROT 6.9 6.9 6.6  ALBUMIN 4.3 4.2 3.7   Recent Labs    09/09/16 1035  LIPASE 21  AMYLASE 61   No results for input(s): AMMONIA in the last 8760 hours. CBC: Recent Labs    06/06/16 0822 10/24/16 0837 11/04/16 0528  WBC 6.6 7.2 14.7*  NEUTROABS 4,290  --   --   HGB 13.4 12.7 12.2  HCT 41.5 38.9 36.9  MCV 91.8 94.2 92.5  PLT 289 265 224   Lipid Panel: Recent Labs    06/06/16 0822  CHOL 186  HDL 75  LDLCALC 92  TRIG 95  CHOLHDL 2.5   Assessment/Plan 1. Senile  osteoporosis - cont prolia q 6 mos and vitamin D, weightbearing exercises - CBC with Differential/Platelet; Future - COMPLETE METABOLIC PANEL WITH GFR; Future - VITAMIN D 25 Hydroxy (Vit-D Deficiency, Fractures); Future  2. Weight loss - stabilized - CBC with Differential/Platelet; Future - COMPLETE METABOLIC PANEL WITH GFR; Future - Hemoglobin A1c; Future - Lipid panel; Future  3. 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 Hyperglycemia - noted on fasting  labs in the past so check hba1c with annual labs - Hemoglobin A1c; Future  5. Need for influenza vaccination -flu shot given  Labs/tests ordered:   Orders Placed This Encounter  Procedures  . CBC with Differential/Platelet    Standing Status:   Future    Standing Expiration Date:   10/09/2017  . COMPLETE METABOLIC PANEL WITH GFR    Standing Status:   Future    Standing Expiration Date:   10/09/2017  . Hemoglobin A1c    Standing Status:   Future    Standing Expiration Date:   10/09/2017  . Lipid panel    Standing Status:   Future    Standing Expiration Date:   10/09/2017  . VITAMIN D 25 Hydroxy (Vit-D Deficiency, Fractures)    Standing Status:   Future    Standing Expiration Date:   10/09/2017   Next appt:  06/08/2017 annual exam, labs before  Shray Hunley L. Celestia Duva, D.O. [facility] [address], [place] [address] Cell Phone (Mon-Fri 8am-5pm):  [phone] On Call:  [phone] & follow prompts after 5pm & weekends Office Phone:  [phone] Office Fax:  [phone]

## 2017-03-30 ENCOUNTER — Ambulatory Visit (INDEPENDENT_AMBULATORY_CARE_PROVIDER_SITE_OTHER): Payer: Medicare Other

## 2017-03-30 DIAGNOSIS — M810 Age-related osteoporosis without current pathological fracture: Secondary | ICD-10-CM | POA: Diagnosis not present

## 2017-03-30 MED ORDER — DENOSUMAB 60 MG/ML ~~LOC~~ SOLN
60.0000 mg | Freq: Once | SUBCUTANEOUS | Status: AC
  Administered 2017-03-30: 60 mg via SUBCUTANEOUS

## 2017-06-08 ENCOUNTER — Other Ambulatory Visit: Payer: Medicare Other

## 2017-06-08 DIAGNOSIS — M810 Age-related osteoporosis without current pathological fracture: Secondary | ICD-10-CM

## 2017-06-08 DIAGNOSIS — R634 Abnormal weight loss: Secondary | ICD-10-CM

## 2017-06-08 DIAGNOSIS — R739 Hyperglycemia, unspecified: Secondary | ICD-10-CM

## 2017-06-12 ENCOUNTER — Ambulatory Visit (INDEPENDENT_AMBULATORY_CARE_PROVIDER_SITE_OTHER): Payer: Medicare Other

## 2017-06-12 ENCOUNTER — Ambulatory Visit: Payer: Medicare Other | Admitting: Internal Medicine

## 2017-06-12 ENCOUNTER — Encounter: Payer: Self-pay | Admitting: Internal Medicine

## 2017-06-12 VITALS — BP 118/68 | HR 62 | Temp 97.9°F | Ht 65.0 in | Wt 112.0 lb

## 2017-06-12 DIAGNOSIS — M810 Age-related osteoporosis without current pathological fracture: Secondary | ICD-10-CM | POA: Diagnosis not present

## 2017-06-12 DIAGNOSIS — R252 Cramp and spasm: Secondary | ICD-10-CM

## 2017-06-12 DIAGNOSIS — H518 Other specified disorders of binocular movement: Secondary | ICD-10-CM | POA: Diagnosis not present

## 2017-06-12 DIAGNOSIS — R634 Abnormal weight loss: Secondary | ICD-10-CM

## 2017-06-12 DIAGNOSIS — Z0000 Encounter for general adult medical examination without abnormal findings: Secondary | ICD-10-CM

## 2017-06-12 DIAGNOSIS — Z789 Other specified health status: Secondary | ICD-10-CM

## 2017-06-12 DIAGNOSIS — R002 Palpitations: Secondary | ICD-10-CM | POA: Diagnosis not present

## 2017-06-12 DIAGNOSIS — E559 Vitamin D deficiency, unspecified: Secondary | ICD-10-CM | POA: Diagnosis not present

## 2017-06-12 LAB — COMPLETE METABOLIC PANEL WITH GFR
AG Ratio: 1.7 (calc) (ref 1.0–2.5)
ALT: 16 U/L (ref 6–29)
AST: 17 U/L (ref 10–35)
Albumin: 4.4 g/dL (ref 3.6–5.1)
Alkaline phosphatase (APISO): 60 U/L (ref 33–130)
BUN: 13 mg/dL (ref 7–25)
CO2: 30 mmol/L (ref 20–32)
Calcium: 9.3 mg/dL (ref 8.6–10.4)
Chloride: 102 mmol/L (ref 98–110)
Creat: 0.74 mg/dL (ref 0.60–0.93)
GFR, Est African American: 91 mL/min/{1.73_m2} (ref >=60)
GFR, Est Non African American: 78 mL/min/{1.73_m2} (ref >=60)
Globulin: 2.6 g/dL (calc) (ref 1.9–3.7)
Glucose, Bld: 105 mg/dL — ABNORMAL HIGH (ref 65–99)
Potassium: 4.9 mmol/L (ref 3.5–5.3)
Sodium: 138 mmol/L (ref 135–146)
Total Bilirubin: 0.5 mg/dL (ref 0.2–1.2)
Total Protein: 7 g/dL (ref 6.1–8.1)

## 2017-06-12 LAB — VITAMIN D 25 HYDROXY (VIT D DEFICIENCY, FRACTURES): Vit D, 25-Hydroxy: 33 ng/mL (ref 30–100)

## 2017-06-12 LAB — HEMOGLOBIN A1C
Hgb A1c MFr Bld: 5.7 % of total Hgb — ABNORMAL HIGH (ref <5.7)
Mean Plasma Glucose: 117 (calc)
eAG (mmol/L): 6.5 (calc)

## 2017-06-12 LAB — CBC WITH DIFFERENTIAL/PLATELET
Basophils Absolute: 68 cells/uL (ref 0–200)
Basophils Relative: 1 %
Eosinophils Absolute: 143 cells/uL (ref 15–500)
Eosinophils Relative: 2.1 %
HCT: 40.4 % (ref 35.0–45.0)
Hemoglobin: 13.7 g/dL (ref 11.7–15.5)
Lymphs Abs: 1986 cells/uL (ref 850–3900)
MCH: 30.5 pg (ref 27.0–33.0)
MCHC: 33.9 g/dL (ref 32.0–36.0)
MCV: 90 fL (ref 80.0–100.0)
MPV: 10 fL (ref 7.5–12.5)
Monocytes Relative: 7.2 %
Neutro Abs: 4114 cells/uL (ref 1500–7800)
Neutrophils Relative %: 60.5 %
Platelets: 337 10*3/uL (ref 140–400)
RBC: 4.49 10*6/uL (ref 3.80–5.10)
RDW: 12.4 % (ref 11.0–15.0)
Total Lymphocyte: 29.2 %
WBC mixed population: 490 cells/uL (ref 200–950)
WBC: 6.8 10*3/uL (ref 3.8–10.8)

## 2017-06-12 LAB — MAGNESIUM: Magnesium: 2.5 mg/dL (ref 1.5–2.5)

## 2017-06-12 LAB — LIPID PANEL
Cholesterol: 197 mg/dL (ref <200)
HDL: 62 mg/dL (ref >50)
LDL Cholesterol (Calc): 115 mg/dL (calc) — ABNORMAL HIGH
Non-HDL Cholesterol (Calc): 135 mg/dL (calc) — ABNORMAL HIGH (ref <130)
Total CHOL/HDL Ratio: 3.2 (calc) (ref <5.0)
Triglycerides: 102 mg/dL (ref <150)

## 2017-06-12 LAB — VITAMIN B12: Vitamin B-12: 485 pg/mL (ref 200–1100)

## 2017-06-12 LAB — TEST AUTHORIZATION

## 2017-06-12 MED ORDER — ZOSTER VAC RECOMB ADJUVANTED 50 MCG/0.5ML IM SUSR: 0.5000 mL | Freq: Once | INTRAMUSCULAR | 1 refills | Status: DC

## 2017-06-12 NOTE — Patient Instructions (Signed)
Adriana [name] , Thank you for taking time to come for your Medicare Wellness Visit. I appreciate your ongoing commitment to your health goals. Please review the following plan we discussed and let me know if I can assist you in the future.   Screening recommendations/referrals: Colonoscopy excluded, over age 78 Mammogram up to date, due 09/02/2017 Bone Density up to date Recommended yearly ophthalmology/optometry visit for glaucoma screening and checkup Recommended yearly dental visit for hygiene and checkup  Vaccinations: Influenza vaccine up to date, due 2019 fall season Pneumococcal vaccine up to date, completed  Tdap vaccine up to date, due 12/15/2023 Shingles vaccine due, prescription sent to pharmacy    Advanced directives: in chart  Conditions/risks identified: none  Next appointment: [name], RN 06/15/2018 @ 9:15am   Preventive Care 65 Years and Older, Female Preventive care refers to lifestyle choices and visits with your health care provider that can promote health and wellness. What does preventive care include?  A yearly physical exam. This is also called an annual well check.  Dental exams once or twice a year.  Routine eye exams. Ask your health care provider how often you should have your eyes checked.  Personal lifestyle choices, including:  Daily care of your teeth and gums.  Regular physical activity.  Eating a healthy diet.  Avoiding tobacco and drug use.  Limiting alcohol use.  Practicing safe sex.  Taking low-dose aspirin every day.  Taking vitamin and mineral supplements as recommended by your health care provider. What happens during an annual well check? The services and screenings done by your health care provider during your annual well check will depend on your age, overall health, lifestyle risk factors, and family history of disease. Counseling  Your health care provider may ask you questions about your:  Alcohol use.  Tobacco  use.  Drug use.  Emotional well-being.  Home and relationship well-being.  Sexual activity.  Eating habits.  History of falls.  Memory and ability to understand (cognition).  Work and work Statistician.  Reproductive health. Screening  You may have the following tests or measurements:  Height, weight, and BMI.  Blood pressure.  Lipid and cholesterol levels. These may be checked every 5 years, or more frequently if you are over 30 years old.  Skin check.  Lung cancer screening. You may have this screening every year starting at age 77 if you have a 30-pack-year history of smoking and currently smoke or have quit within the past 15 years.  Fecal occult blood test (FOBT) of the stool. You may have this test every year starting at age 11.  Flexible sigmoidoscopy or colonoscopy. You may have a sigmoidoscopy every 5 years or a colonoscopy every 10 years starting at age 62.  Hepatitis C blood test.  Hepatitis B blood test.  Sexually transmitted disease (STD) testing.  Diabetes screening. This is done by checking your blood sugar (glucose) after you have not eaten for a while (fasting). You may have this done every 1-3 years.  Bone density scan. This is done to screen for osteoporosis. You may have this done starting at age 26.  Mammogram. This may be done every 1-2 years. Talk to your health care provider about how often you should have regular mammograms. Talk with your health care provider about your test results, treatment options, and if necessary, the need for more tests. Vaccines  Your health care provider may recommend certain vaccines, such as:  Influenza vaccine. This is recommended every year.  Tetanus,  diphtheria, and acellular pertussis (Tdap, Td) vaccine. You may need a Td booster every 10 years.  Zoster vaccine. You may need this after age 18.  Pneumococcal 13-valent conjugate (PCV13) vaccine. One dose is recommended after age 56.  Pneumococcal  polysaccharide (PPSV23) vaccine. One dose is recommended after age 36. Talk to your health care provider about which screenings and vaccines you need and how often you need them. This information is not intended to replace advice given to you by your health care provider. Make sure you discuss any questions you have with your health care provider. Document Released: 04/17/2015 Document Revised: 12/09/2015 Document Reviewed: 01/20/2015 Elsevier Interactive Patient Education  2017 [employer] Prevention in the Home Falls can cause injuries. They can happen to people of all ages. There are many things you can do to make your home safe and to help prevent falls. What can I do on the outside of my home?  Regularly fix the edges of walkways and driveways and fix any cracks.  Remove anything that might make you trip as you walk through a door, such as a raised step or threshold.  Trim any bushes or trees on the path to your home.  Use bright outdoor lighting.  Clear any walking paths of anything that might make someone trip, such as rocks or tools.  Regularly check to see if handrails are loose or broken. Make sure that both sides of any steps have handrails.  Any raised decks and porches should have guardrails on the edges.  Have any leaves, snow, or ice cleared regularly.  Use sand or salt on walking paths during winter.  Clean up any spills in your garage right away. This includes oil or grease spills. What can I do in the bathroom?  Use night lights.  Install grab bars by the toilet and in the tub and shower. Do not use towel bars as grab bars.  Use non-skid mats or decals in the tub or shower.  If you need to sit down in the shower, use a plastic, non-slip stool.  Keep the floor dry. Clean up any water that spills on the floor as soon as it happens.  Remove soap buildup in the tub or shower regularly.  Attach bath mats securely with double-sided non-slip rug  tape.  Do not have throw rugs and other things on the floor that can make you trip. What can I do in the bedroom?  Use night lights.  Make sure that you have a light by your bed that is easy to reach.  Do not use any sheets or blankets that are too big for your bed. They should not hang down onto the floor.  Have a firm chair that has side arms. You can use this for support while you get dressed.  Do not have throw rugs and other things on the floor that can make you trip. What can I do in the kitchen?  Clean up any spills right away.  Avoid walking on wet floors.  Keep items that you use a lot in easy-to-reach places.  If you need to reach something above you, use a strong step stool that has a grab bar.  Keep electrical cords out of the way.  Do not use floor polish or wax that makes floors slippery. If you must use wax, use non-skid floor wax.  Do not have throw rugs and other things on the floor that can make you trip. What can I do with  my stairs?  Do not leave any items on the stairs.  Make sure that there are handrails on both sides of the stairs and use them. Fix handrails that are broken or loose. Make sure that handrails are as long as the stairways.  Check any carpeting to make sure that it is firmly attached to the stairs. Fix any carpet that is loose or worn.  Avoid having throw rugs at the top or bottom of the stairs. If you do have throw rugs, attach them to the floor with carpet tape.  Make sure that you have a light switch at the top of the stairs and the bottom of the stairs. If you do not have them, ask someone to add them for you. What else can I do to help prevent falls?  Wear shoes that:  Do not have high heels.  Have rubber bottoms.  Are comfortable and fit you well.  Are closed at the toe. Do not wear sandals.  If you use a stepladder:  Make sure that it is fully opened. Do not climb a closed stepladder.  Make sure that both sides of the  stepladder are locked into place.  Ask someone to hold it for you, if possible.  Clearly mark and make sure that you can see:  Any grab bars or handrails.  First and last steps.  Where the edge of each step is.  Use tools that help you move around (mobility aids) if they are needed. These include:  Canes.  Walkers.  Scooters.  Crutches.  Turn on the lights when you go into a dark area. Replace any light bulbs as soon as they burn out.  Set up your furniture so you have a clear path. Avoid moving your furniture around.  If any of your floors are uneven, fix them.  If there are any pets around you, be aware of where they are.  Review your medicines with your doctor. Some medicines can make you feel dizzy. This can increase your chance of falling. Ask your doctor what other things that you can do to help prevent falls. This information is not intended to replace advice given to you by your health care provider. Make sure you discuss any questions you have with your health care provider. Document Released: 01/15/2009 Document Revised: 08/27/2015 Document Reviewed: 04/25/2014 Elsevier Interactive Patient Education  2017 [employer].

## 2017-06-12 NOTE — Progress Notes (Signed)
Provider:  Rexene Edison. Mariea Clonts, D.O., C.M.D. Location:   [facility]   Place of Service:     Previous PCP: Gayland Curry, DO Patient Care Team: Gayland Curry, DO as PCP - General (Geriatric Medicine) Shon Hough, MD as Consulting Physician (Ophthalmology) Gevena Cotton, MD as Consulting Physician (Ophthalmology)  Extended Emergency Contact Information Primary Emergency Contact: [facility] Address: [address]          [place], [place] [address] Johnnette Litter of [place] Phone: [phone] Relation: Son Secondary Emergency Contact: [facility] Address: [address]          [place], [place] [address] Johnnette Litter of [place] Phone: [phone] Relation: Son  Code Status: FC Goals of Care: Advanced Directive information Advanced Directives 06/12/2017  Does Patient Have a Medical Advance Directive? Yes  Type of Advance Directive [place]  Does patient want to make changes to medical advance directive? No - Patient declined  Copy of [place] in Chart? Yes  Would patient like information on creating a medical advance directive? -      Chief Complaint  Patient presents with  . Annual Exam    CPE  . ACP    HCPOA    HPI: Patient is a 77 y.o. female seen today for an annual physical exam. She has some minor complaints for today, but reports otherwise feeling well.   She reports history of MVP. And recently at [facility] on treadmill HR 110, then you notice it goes up to 150-175's. She does notice this and she feels the palpitations. She stops walking when she sees this. She does not notice any lightheadedness. Denies fainting or syncope. Denies chest pain, but can feel the pound sensation. She notices that this occurs when she is really working/pushing herself. Walks with friends without issue.   Toes cramping: She has noticed an increased in this sensation, which occurs daily. She notices this first thing in the morning and  when walking. She reports drinking enough water. She denies trouble walking. Wears good supports shoes and eats a healthy diet.   She is concerned about the vertebra sticking out. Denies injury or falls. She notices mild soreness when pressed against something. Overall it is not an issue, but is concerned and was thinking it might be from losing weight.   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 She does not take extra calcium. She is pescatarian avoids red meat. Fish, eggs, cheese, and yogurts, veggies and fruit. Does use olive oil for cooking.   Diplopia: She is being seen by Dr. Kathrin Penner every 9 months. 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 She continues to wear prisms and can drive and does not have issues with vision at this time.    Past Medical History:  Diagnosis Date  . Cancer (HCC)    squamous cell face removed  . Cellulitis, toe    osteomylitis  . Cholelithiasis   . Diverticulitis   . Hiatal hernia    hx of 25 years ago  . Hyperlipidemia   . Mitral valve prolapse   . Osteoporosis   . PONV (postoperative nausea and vomiting)    Past Surgical History:  Procedure Laterality Date  . BREAST BIOPSY     Left,negative   . CHOLECYSTECTOMY     11/02/16 Dr. Harlow Asa  . CHOLECYSTECTOMY N/A 11/02/2016   Procedure: LAPAROSCOPIC CHOLECYSTECTOMY WITH INTRAOPERATIVE CHOLANGIOGRAM;  Surgeon: Armandina Gemma, MD;  Location: [facility];  Service: General;  Laterality: N/A;  .  COLONOSCOPY  05/2003   No polyps, hemorrhoids,and diverticulosis   . ENDOSCOPIC RETROGRADE CHOLANGIOPANCREATOGRAPHY (ERCP) WITH PROPOFOL N/A 11/04/2016   Procedure: ENDOSCOPIC RETROGRADE CHOLANGIOPANCREATOGRAPHY (ERCP) WITH PROPOFOL;  Surgeon: [name], MD;  Location: [facility];  Service: Gastroenterology;  Laterality: N/A;  . EYE SURGERY     r eye cataract removed    reports that  has never smoked. she has never used smokeless tobacco. She reports that she does not drink alcohol or use  drugs.  Functional Status Survey:    Family History  Problem Relation Age of Onset  . Pneumonia Father 25  . Cerebrovascular Accident Mother 63  . Stroke Mother   . Breast cancer Sister 11    Health Maintenance  Topic Date Due  . MAMMOGRAM  09/02/2017  . TETANUS/TDAP  12/15/2023  . INFLUENZA VACCINE  Completed  . DEXA SCAN  Completed  . PNA vac Low Risk Adult  Completed    Allergies  Allergen Reactions  . Chlorhexidine Rash    Severe rash after her cholecystectomy and ERCP  . Adhesive [Tape] Rash and Other (See Comments)    Skin irritation with extended exposure.  . Latex Rash and Other (See Comments)    Blistering with extended exposure    Outpatient Encounter Medications as of 06/12/2017  Medication Sig  . Cholecalciferol (VITAMIN D-3) 1000 units CAPS Take 1,000 Units by mouth daily with supper.   . denosumab (PROLIA) 60 MG/ML SOLN injection Inject 60 mg into the skin every 6 (six) months. Administer in upper arm, thigh, or abdomen  . Polyethyl Glycol-Propyl Glycol (LUBRICANT EYE DROPS) 0.4-0.3 % SOLN Place 1 drop into both eyes daily as needed (for dry eyes.).  . [DISCONTINUED] Zoster Vaccine Adjuvanted [facility]) injection Inject 0.5 mLs into the muscle once for 1 dose.   No facility-administered encounter medications on file as of 06/12/2017.     Review of Systems  Constitutional: Negative for chills, fever and malaise/fatigue.  HENT: Negative for hearing loss.   Eyes: Positive for double vision.  Respiratory: Positive for cough and shortness of breath.   Cardiovascular: Positive for palpitations. Negative for chest pain and leg swelling.  Gastrointestinal: Negative for blood in stool and heartburn.  Genitourinary: Negative for hematuria.  Musculoskeletal: Negative.   Neurological: Negative for dizziness and headaches.  Psychiatric/Behavioral: Negative for depression and memory loss. The patient is not nervous/anxious and does not have insomnia.     Vitals:    06/12/17 0856  BP: 118/68  Pulse: 62  Temp: 97.9 F (36.6 C)  TempSrc: Oral  SpO2: 98%  Weight: 112 lb (50.8 kg)  Height: 5\' 5"  (1.651 m)   Body mass index is 18.64 kg/m. Physical Exam  Constitutional: She is oriented to person, place, and time. She appears well-developed and well-nourished.  HENT:  Head: Normocephalic.  Right Ear: External ear normal.  Left Ear: External ear normal.  Nose: Nose normal.  Mouth/Throat: Oropharynx is clear and moist.  Eyes: Conjunctivae and EOM are normal. Pupils are equal, round, and reactive to light.  Neck: Normal range of motion. Neck supple.  Cardiovascular: Normal rate, regular rhythm, normal heart sounds and intact distal pulses.  Pulmonary/Chest: Effort normal and breath sounds normal.  Abdominal: Soft. Bowel sounds are normal.  Musculoskeletal: Normal range of motion.  Neurological: She is alert and oriented to person, place, and time.  Skin: Skin is warm and dry. Capillary refill takes less than 2 seconds.  Psychiatric: She has a normal mood and affect. Her behavior is  normal. Judgment and thought content normal.  Vitals reviewed.   Labs reviewed: Basic Metabolic Panel: Recent Labs    10/24/16 0837 11/04/16 0528 06/08/17 0807  NA 138 139 138  K 4.6 3.7 4.9  CL 106 106 102  CO2 26 25 30   GLUCOSE 104* 103* 105*  BUN 16 12 13   CREATININE 0.64 0.59 0.74  CALCIUM 8.8* 8.2* 9.3   Liver Function Tests: Recent Labs    09/09/16 1035 10/24/16 0837 11/04/16 0528 06/08/17 0807  AST 14 21 67* 17  ALT 13 15 181* 16  ALKPHOS 138* 78 77  --   BILITOT 0.4 0.6 0.7 0.5  PROT 6.9 6.9 6.6 7.0  ALBUMIN 4.3 4.2 3.7  --    Recent Labs    09/09/16 1035  LIPASE 21  AMYLASE 61   No results for input(s): AMMONIA in the last 8760 hours. CBC: Recent Labs    10/24/16 0837 11/04/16 0528 06/08/17 0807  WBC 7.2 14.7* 6.8  NEUTROABS  --   --  4,114  HGB 12.7 12.2 13.7  HCT 38.9 36.9 40.4  MCV 94.2 92.5 90.0  PLT 265 224 337    Cardiac Enzymes: No results for input(s): CKTOTAL, CKMB, CKMBINDEX, TROPONINI in the last 8760 hours. BNP: Invalid input(s): POCBNP Lab Results  Component Value Date   HGBA1C 5.7 (H) 06/08/2017   Lab Results  Component Value Date   TSH 1.66 06/06/2016   Lab Results  Component Value Date   VITAMINB12 656 06/06/2016   Lab Results  Component Value Date   FOLATE 12.5 06/06/2016   Lab Results  Component Value Date   IRON 86 06/05/2015   TIBC 286 06/05/2015   FERRITIN 80 06/05/2015    Imaging and Procedures Recently:  Assessment/Plan  1. Senile osteoporosis Is on prolia at this time and is tolerating it well. Advised to continue treatment.   2. Weight loss She reports improved weigh gain as she is now 107 lbs without clothes at home. She says she likes to eat a more vegan like diet. She does not eat a lot of protein heavy foods, but does enjoy fish and nuts. Labs looked good for protein and Hbg levels.  3. Vegetarian diet She added eggs, dairy and fish to her diet to make it more balanced. She reports usually following a mediterranean diet and enjoys lots of olive oil which might have caused her elevation in LDL. Advise to reduce the amount of olive oil.  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 She reports improvement with them. She sees Dentist every 9 months.   5. Heart palpitations She has started experiencing palpitations with elevated HR as detailed in HPI. She denies symptoms today in office and reports it only occurs when she pushes herself when working out. Blood pressure is in range and normal today..  - EKG 12-Lead  6. Annual physical exam Performed annual exam. She is up to date on vaccines and screenings.   7. Vitamin D insufficiency She showed lower Vitamin D levels on labs even with taking 1000 IU daily. Will increase this to 2000 IU daily and  reassess with future labs.  8. Leg cramps She has on going toes/lowerleg cramps in the morning. Will access a mag level, otherwise encouraged to increase her water intake and to maintain a balanced diet.    Labs/tests ordered:   Orders Placed This Encounter  Procedures  . EKG 12-Lead    Karen Kays, RN, DNP Student Geriatrics [facility] [phone], [place] [address] Cell Phone (Mon-Fri 8am-5pm):  [phone] On Call:  [phone] & follow prompts after 5pm & weekends Office Phone:  [phone] Office Fax:  [phone]

## 2017-06-12 NOTE — Patient Instructions (Addendum)
Increase your vitamin D3 to 2000 units daily. Decrease the olive oil in your diet to help keep your cholesterol from trending up Continue a balanced diet to prevent loss of fat around your vertebrae.  Consider changing bras to avoid pressure on that vertebra and continue your yoga for posture. Call us if you continue to have difficulty with palpitations during exercise.  Your EKG was normal.

## 2017-06-12 NOTE — Progress Notes (Signed)
Subjective:   Adriana [name] is a 77 y.o. female who presents for Medicare Annual (Subsequent) preventive examination.  Last AWV-06/06/2016    Objective:     Vitals: BP 118/68 (BP Location: Left Arm, Patient Position: Sitting)   Pulse 62   Temp 97.9 F (36.6 C) (Oral)   Ht 5\' 5"  (1.651 m)   Wt 112 lb (50.8 kg)   SpO2 98%   BMI 18.64 kg/m   Body mass index is 18.64 kg/m.  Advanced Directives 06/12/2017 06/12/2017 02/09/2017 11/04/2016 11/02/2016 10/24/2016 09/19/2016  Does Patient Have a Medical Advance Directive? Yes Yes Yes No No Yes Yes  Type of Advance Directive Healthcare Power of [place];Living will -  Does patient want to make changes to medical advance directive? No - Patient declined No - Patient declined No - Patient declined - - - -  Copy of [place] in Chart? Yes Yes Yes - - No - copy requested -  Would patient like information on creating a medical advance directive? - - - - No - Patient declined - -    Tobacco Social History   Tobacco Use  Smoking Status Never Smoker  Smokeless Tobacco Never Used     Counseling given: Not Answered   Clinical Intake:  Pre-visit preparation completed: No        Nutritional Risks: None Diabetes: No  How often do you need to have someone help you when you read instructions, pamphlets, or other written materials from your doctor or pharmacy?: 1 - Never What is the last grade level you completed in school?: Masters  Interpreter Needed?: No  Information entered by :: [name], RN  Past Medical History:  Diagnosis Date  . Cancer (HCC)    squamous cell face removed  . Cellulitis, toe    osteomylitis  . Cholelithiasis   . Diverticulitis   . Hiatal hernia    hx of 25 years ago  . Hyperlipidemia   . Mitral valve prolapse   . Osteoporosis   . PONV (postoperative nausea and vomiting)    Past Surgical  History:  Procedure Laterality Date  . BREAST BIOPSY     Left,negative   . CHOLECYSTECTOMY     11/02/16 Dr. Harlow Asa  . CHOLECYSTECTOMY N/A 11/02/2016   Procedure: LAPAROSCOPIC CHOLECYSTECTOMY WITH INTRAOPERATIVE CHOLANGIOGRAM;  Surgeon: Armandina Gemma, MD;  Location: [facility];  Service: General;  Laterality: N/A;  . COLONOSCOPY  05/2003   No polyps, hemorrhoids,and diverticulosis   . ENDOSCOPIC RETROGRADE CHOLANGIOPANCREATOGRAPHY (ERCP) WITH PROPOFOL N/A 11/04/2016   Procedure: ENDOSCOPIC RETROGRADE CHOLANGIOPANCREATOGRAPHY (ERCP) WITH PROPOFOL;  Surgeon: [name], MD;  Location: [facility];  Service: Gastroenterology;  Laterality: N/A;  . EYE SURGERY     r eye cataract removed   Family History  Problem Relation Age of Onset  . Pneumonia Father 8  . Cerebrovascular Accident Mother 18  . Stroke Mother   . Breast cancer Sister 44   Social History   Socioeconomic History  . Marital status: Widowed    Spouse name: None  . Number of children: None  . Years of education: None  . Highest education level: None  Social Needs  . Financial resource strain: Not hard at all  . Food insecurity - worry: Never true  . Food insecurity - inability: Never true  . Transportation needs - medical: No  . Transportation needs - non-medical: No  Occupational History  .  None  Tobacco Use  . Smoking status: Never Smoker  . Smokeless tobacco: Never Used  Substance and Sexual Activity  . Alcohol use: No    Frequency: Never  . Drug use: No  . Sexual activity: No  Other Topics Concern  . None  Social History Narrative   Dr.Stoneburner, Ophthalmologist   Dr.Peterson, Urologist   Dr.Lomax, Dermatologist      Outpatient Encounter Medications as of 06/12/2017  Medication Sig  . Cholecalciferol (VITAMIN D-3) 1000 units CAPS Take 1,000 Units by mouth daily with supper.   . denosumab (PROLIA) 60 MG/ML SOLN injection Inject 60 mg into the skin every 6 (six) months. Administer in upper arm, thigh, or  abdomen  . Polyethyl Glycol-Propyl Glycol (LUBRICANT EYE DROPS) 0.4-0.3 % SOLN Place 1 drop into both eyes daily as needed (for dry eyes.).  Marland Kitchen Zoster Vaccine Adjuvanted [facility]) injection Inject 0.5 mLs into the muscle once for 1 dose.  . [DISCONTINUED] Zoster Vaccine Adjuvanted [facility]) injection Inject 0.5 mLs into the muscle once.   No facility-administered encounter medications on file as of 06/12/2017.     Activities of Daily Living In your present state of health, do you have any difficulty performing the following activities: 06/12/2017 11/02/2016  Hearing? N N  Vision? Y N  Comment - -  Difficulty concentrating or making decisions? N N  Walking or climbing stairs? N N  Dressing or bathing? N N  Doing errands, shopping? N N  Preparing Food and eating ? N -  Using the Toilet? N -  In the past six months, have you accidently leaked urine? Y -  Do you have problems with loss of bowel control? N -  Managing your Medications? N -  Managing your Finances? N -  Housekeeping or managing your Housekeeping? N -  Some recent data might be hidden    Patient Care Team: Gayland Curry, DO as PCP - General (Geriatric Medicine) Shon Hough, MD as Consulting Physician (Ophthalmology) Gevena Cotton, MD as Consulting Physician (Ophthalmology)    Assessment:   This is a routine wellness examination for Adriana [name].  Exercise Activities and Dietary recommendations Current Exercise Habits: Home exercise routine, Type of exercise: yoga;walking, Time (Minutes): 30, Frequency (Times/Week): 3, Weekly Exercise (Minutes/Week): 90, Intensity: Mild  Goals    None      Fall Risk Fall Risk  06/12/2017 02/09/2017 09/19/2016 09/09/2016 06/06/2016  Falls in the past year? No No No Yes No  Number falls in past yr: - - - 1 -  Injury with Fall? - - - No -   Is the patient's home free of loose throw rugs in walkways, pet beds, electrical cords, etc?   yes      Grab bars in the bathroom? no       Handrails on the stairs?   yes      Adequate lighting?   yes  Depression Screen PHQ 2/9 Scores 06/12/2017 02/09/2017 09/19/2016 09/09/2016  PHQ - 2 Score 0 0 0 0  Exception Documentation - - - -  Not completed - - - -     Cognitive Function MMSE - Mini Mental State Exam 06/12/2017 06/06/2016 06/08/2015  Orientation to time 5 5 5   Orientation to Place 5 5 5   Registration 3 3 3   Attention/ Calculation 5 5 5   Recall 3 3 3   Language- name 2 objects 2 2 2   Language- repeat 1 1 1   Language- follow 3 step command 3 3 3   Language- read &  follow direction 1 1 1   Write a sentence 1 1 1   Copy design 1 1 1   Total score 30 30 30         Immunization History  Administered Date(s) Administered  . Influenza Split 01/30/2012  . Influenza, High Dose Seasonal PF 02/09/2017  . Influenza, Seasonal, Injecte, Preservative Fre 01/03/2015  . Influenza,inj,Quad PF,6+ Mos 01/08/2013, 12/08/2015  . Influenza-Unspecified 01/03/2014  . Pneumococcal Conjugate-13 06/06/2014  . Pneumococcal Polysaccharide-23 06/02/2005  . Td 04/04/2002  . Tdap 12/14/2013  . Zoster 04/04/2009    Qualifies for Shingles Vaccine? Yes, educated and prescription sent to pharmacy  Screening Tests Health Maintenance  Topic Date Due  . MAMMOGRAM  09/02/2017  . TETANUS/TDAP  12/15/2023  . INFLUENZA VACCINE  Completed  . DEXA SCAN  Completed  . PNA vac Low Risk Adult  Completed    Cancer Screenings: Lung: Low Dose CT Chest recommended if Age 27-80 years, 30 pack-year currently smoking OR have quit w/in 15years. Patient does not qualify. Breast:  Up to date on Mammogram? Yes   Up to date of Bone Density/Dexa? Yes Colorectal: up to date  Additional Screenings:  Hepatitis C Screening: declined     Plan:    I have personally reviewed and addressed the Medicare Annual Wellness questionnaire and have noted the following in the patient's chart:  A. Medical and social history B. Use of alcohol, tobacco or illicit drugs   C. Current medications and supplements D. Functional ability and status E.  Nutritional status F.  Physical activity G. Advance directives H. List of other physicians I.  Hospitalizations, surgeries, and ER visits in previous 12 months J.  [place] to include hearing, vision, cognitive, depression L. Referrals and appointments - none  In addition, I have reviewed and discussed with patient certain preventive protocols, quality metrics, and best practice recommendations. A written personalized care plan for preventive services as well as general preventive health recommendations were provided to patient.  See attached scanned questionnaire for additional information.   Signed,   [name], RN Nurse Health Advisor  Patient Concerns: None

## 2017-09-04 LAB — HM MAMMOGRAPHY

## 2017-09-07 ENCOUNTER — Encounter: Payer: Self-pay | Admitting: *Deleted

## 2017-09-28 ENCOUNTER — Ambulatory Visit: Payer: Medicare Other

## 2017-09-29 ENCOUNTER — Ambulatory Visit: Payer: Medicare Other | Admitting: *Deleted

## 2017-09-29 DIAGNOSIS — M810 Age-related osteoporosis without current pathological fracture: Secondary | ICD-10-CM

## 2017-09-29 MED ORDER — DENOSUMAB 60 MG/ML ~~LOC~~ SOSY
60.0000 mg | PREFILLED_SYRINGE | Freq: Once | SUBCUTANEOUS | Status: AC
  Administered 2017-09-29: 60 mg via SUBCUTANEOUS

## 2017-12-11 ENCOUNTER — Ambulatory Visit (INDEPENDENT_AMBULATORY_CARE_PROVIDER_SITE_OTHER): Payer: Medicare Other | Admitting: Internal Medicine

## 2017-12-11 ENCOUNTER — Encounter: Payer: Self-pay | Admitting: Internal Medicine

## 2017-12-11 VITALS — BP 112/70 | HR 91 | Temp 98.2°F | Ht 65.0 in | Wt 110.0 lb

## 2017-12-11 DIAGNOSIS — M810 Age-related osteoporosis without current pathological fracture: Secondary | ICD-10-CM | POA: Diagnosis not present

## 2017-12-11 DIAGNOSIS — Z23 Encounter for immunization: Secondary | ICD-10-CM

## 2017-12-11 DIAGNOSIS — R739 Hyperglycemia, unspecified: Secondary | ICD-10-CM | POA: Diagnosis not present

## 2017-12-11 DIAGNOSIS — E7800 Pure hypercholesterolemia, unspecified: Secondary | ICD-10-CM

## 2017-12-11 DIAGNOSIS — R002 Palpitations: Secondary | ICD-10-CM | POA: Diagnosis not present

## 2017-12-11 DIAGNOSIS — L03032 Cellulitis of left toe: Secondary | ICD-10-CM

## 2017-12-11 NOTE — Addendum Note (Signed)
Addended by: Despina Hidden on: 12/11/2017 11:55 AM   Modules accepted: Orders

## 2017-12-11 NOTE — Patient Instructions (Signed)
Paronychia Paronychia is an infection of the skin that surrounds a nail. It usually affects the skin around a fingernail, but it may also occur near a toenail. It often causes pain and swelling around the nail. This condition may come on suddenly or develop over a longer period. In some cases, a collection of pus (abscess) can form near or under the nail. Usually, paronychia is not serious and it clears up with treatment. What are the causes? This condition may be caused by bacteria or fungi. It is commonly caused by either Streptococcus or Staphylococcus bacteria. The bacteria or fungi often cause the infection by getting into the affected area through an opening in the skin, such as a cut or a hangnail. What increases the risk? This condition is more likely to develop in:  People who get their hands wet often, such as those who work as Designer, industrial/product, bartenders, or nurses.  People who bite their fingernails or suck their thumbs.  People who trim their nails too short.  People who have hangnails or injured fingertips.  People who get manicures.  People who have diabetes.  What are the signs or symptoms? Symptoms of this condition include:  Redness and swelling of the skin near the nail.  Tenderness around the nail when you touch the area.  Pus-filled bumps under the cuticle. The cuticle is the skin at the base or sides of the nail.  Fluid or pus under the nail.  Throbbing pain in the area.  How is this diagnosed? This condition is usually diagnosed with a physical exam. In some cases, a sample of pus may be taken from an abscess to be tested in a lab. This can help to determine what type of bacteria or fungi is causing the condition. How is this treated? Treatment for this condition depends on the cause and severity of the condition. If the condition is mild, it may clear up on its own in a few days. Your health care provider may recommend soaking the affected area in warm water a  few times a day. When treatment is needed, the options may include:  Antibiotic medicine, if the condition is caused by a bacterial infection.  Antifungal medicine, if the condition is caused by a fungal infection.  Incision and drainage, if an abscess is present. In this procedure, the health care provider will cut open the abscess so the pus can drain out.  Follow these instructions at home:  Soak the affected area in warm water if directed to do so by your health care provider. You may be told to do this for 20 minutes, 2-3 times a day. Keep the area dry in between soakings.  Take medicines only as directed by your health care provider.  If you were prescribed an antibiotic medicine, finish all of it even if you start to feel better.  Keep the affected area clean.  Do not try to drain a fluid-filled bump yourself.  If you will be washing dishes or performing other tasks that require your hands to get wet, wear rubber gloves. You should also wear gloves if your hands might come in contact with irritating substances, such as cleaners or chemicals.  Follow your health care provider's instructions about: ? Wound care. ? Bandage (dressing) changes and removal. Contact a health care provider if:  Your symptoms get worse or do not improve with treatment.  You have a fever or chills.  You have redness spreading from the affected area.  You have continued  or increased fluid, blood, or pus coming from the affected area.  Your finger or knuckle becomes swollen or is difficult to move. This information is not intended to replace advice given to you by your health care provider. Make sure you discuss any questions you have with your health care provider. Document Released: 09/14/2000 Document Revised: 08/27/2015 Document Reviewed: 02/26/2014 Elsevier Interactive Patient Education  2018 Elsevier Inc.  

## 2017-12-11 NOTE — Progress Notes (Signed)
Location:  [facility] clinic Provider:  Pollie Poma L. Mariea Clonts, D.O., C.M.D.  Code Status: full code Goals of Care:  Advanced Directives 06/12/2017  Does Patient Have a Medical Advance Directive? Yes  Type of Advance Directive [place]  Does patient want to make changes to medical advance directive? No - Patient declined  Copy of [place] in Chart? Yes  Would patient like information on creating a medical advance directive? -    Chief Complaint  Patient presents with  . Medical Management of Chronic Issues    52mth follow-up, left foot infected toe    HPI: Patient is a 77 y.o. female seen today for medical management of chronic diseases.    Left 4th toe with paronychia.  Last week had been sore, but she put bacitracin on it and it got better, but then it got worse again and she went to urgent care 9/5.  There she was hypertensive and tachycardic, not normal for her.  She had cbc done, but she has not heard.  She has a f/u with them on Thursday.  She was given bactrim (now 4 days ago).  The redness, swelling and discomfort are improved.  She has a 10 day course to complete. She's felt her pulse racing for several days before the visit Thursday and she felt it when lying down at night.  She thinks this happened due to being out in the garden in her sandals.  Weight stable at home at 107 lbs.  Appetite fine.  Does not eat meat, just fish, veggies and fruit.    Continues on her vitamin D for her osteoporosis.  Continues to walk regularly.    Past Medical History:  Diagnosis Date  . Cancer (HCC)    squamous cell face removed  . Cellulitis, toe    osteomylitis  . Cholelithiasis   . Diverticulitis   . Hiatal hernia    hx of 25 years ago  . Hyperlipidemia   . Mitral valve prolapse   . Osteoporosis   . PONV (postoperative nausea and vomiting)     Past Surgical History:  Procedure Laterality Date  . BREAST BIOPSY     Left,negative   . CHOLECYSTECTOMY      11/02/16 Dr. Harlow Asa  . CHOLECYSTECTOMY N/A 11/02/2016   Procedure: LAPAROSCOPIC CHOLECYSTECTOMY WITH INTRAOPERATIVE CHOLANGIOGRAM;  Surgeon: Armandina Gemma, MD;  Location: [facility];  Service: General;  Laterality: N/A;  . COLONOSCOPY  05/2003   No polyps, hemorrhoids,and diverticulosis   . ENDOSCOPIC RETROGRADE CHOLANGIOPANCREATOGRAPHY (ERCP) WITH PROPOFOL N/A 11/04/2016   Procedure: ENDOSCOPIC RETROGRADE CHOLANGIOPANCREATOGRAPHY (ERCP) WITH PROPOFOL;  Surgeon: [name], MD;  Location: [facility];  Service: Gastroenterology;  Laterality: N/A;  . EYE SURGERY     r eye cataract removed    Allergies  Allergen Reactions  . Chlorhexidine Rash    Severe rash after her cholecystectomy and ERCP  . Adhesive [Tape] Rash and Other (See Comments)    Skin irritation with extended exposure.  . Latex Rash and Other (See Comments)    Blistering with extended exposure    Outpatient Encounter Medications as of 12/11/2017  Medication Sig  . Cholecalciferol (VITAMIN D-3) 1000 units CAPS Take 2,000 Units by mouth daily with supper.  . denosumab (PROLIA) 60 MG/ML SOLN injection Inject 60 mg into the skin every 6 (six) months. Administer in upper arm, thigh, or abdomen  . Polyethyl Glycol-Propyl Glycol (LUBRICANT EYE DROPS) 0.4-0.3 % SOLN Place 1 drop into both eyes daily as  needed (for dry eyes.).  Marland Kitchen sulfamethoxazole-trimethoprim (BACTRIM DS,SEPTRA DS) 800-160 MG tablet    No facility-administered encounter medications on file as of 12/11/2017.     Review of Systems:  Review of Systems  Constitutional: Negative for chills, fever and malaise/fatigue.  HENT: Negative for congestion.   Eyes:       Prisms in glasses for diplopia  Respiratory: Negative for cough and shortness of breath.   Cardiovascular: Positive for palpitations. Negative for chest pain, orthopnea, leg swelling and PND.  Gastrointestinal: Negative for abdominal pain.  Genitourinary: Negative for dysuria.  Musculoskeletal: Negative for falls  and joint pain.  Skin: Negative for itching and rash.       Redness, swelling and warmth on 4th left toe; improving per pt  Neurological: Negative for dizziness.       Slight tremor today  Endo/Heme/Allergies: Does not bruise/bleed easily.  Psychiatric/Behavioral: Negative for depression and memory loss. The patient is nervous/anxious. The patient does not have insomnia.     Health Maintenance  Topic Date Due  . INFLUENZA VACCINE  11/02/2017  . MAMMOGRAM  09/05/2018  . TETANUS/TDAP  12/15/2023  . DEXA SCAN  Completed  . PNA vac Low Risk Adult  Completed    Physical Exam: Vitals:   12/11/17 0836  BP: 112/70  Pulse: 91  Temp: 98.2 F (36.8 C)  TempSrc: Oral  SpO2: 98%  Weight: 110 lb (49.9 kg)  Height: 5\' 5"  (1.651 m)   Body mass index is 18.3 kg/m. Physical Exam  Constitutional: She is oriented to person, place, and time. She appears well-developed and well-nourished. No distress.  HENT:  Head: Normocephalic and atraumatic.  Eyes: Pupils are equal, round, and reactive to light. EOM are normal.  glasses  Cardiovascular: Normal rate, regular rhythm, normal heart sounds and intact distal pulses.  No murmur heard. HR was 84 when I listened to her and regular  Pulmonary/Chest: Effort normal and breath sounds normal. No respiratory distress.  Abdominal: Bowel sounds are normal.  Musculoskeletal: Normal range of motion. She exhibits no tenderness.  Neurological: She is alert and oriented to person, place, and time. No cranial nerve deficit.  Skin: Skin is warm and dry. Capillary refill takes less than 2 seconds.  Left 4th toe with swelling, erythema, warmth only over the distal aspect around the nail, evidence of top layer of skin being gone around the nail also (pt reports improvement)  Psychiatric: She has a normal mood and affect.    Labs reviewed: Basic Metabolic Panel: Recent Labs    06/08/17 0807  NA 138  K 4.9  CL 102  CO2 30  GLUCOSE 105*  BUN 13    CREATININE 0.74  CALCIUM 9.3  MG 2.5   Liver Function Tests: Recent Labs    06/08/17 0807  AST 17  ALT 16  BILITOT 0.5  PROT 7.0   No results for input(s): LIPASE, AMYLASE in the last 8760 hours. No results for input(s): AMMONIA in the last 8760 hours. CBC: Recent Labs    06/08/17 0807  WBC 6.8  NEUTROABS 4,114  HGB 13.7  HCT 40.4  MCV 90.0  PLT 337   Lipid Panel: Recent Labs    06/08/17 0807  CHOL 197  HDL 62  LDLCALC 115*  TRIG 102  CHOLHDL 3.2   Lab Results  Component Value Date   HGBA1C 5.7 (H) 06/08/2017    Assessment/Plan 1. Paronychia of fourth toe, left -being followed in the urgent care about this -completing bactrim--on  day 4 -sees them again after 1 week of abx -has had some tachycardia and bp higher than usual, but cbc was drawn there--she has not yet heard her results -encouraged hydration--HR here was normal by the time of my exam  2. Hyperglycemia - f/u labs before CPE - Hemoglobin A1c; Future  3. Senile osteoporosis -cont vitamin D and walking,  prolia therapy--gets end of June and end of Dec  4. Heart palpitations - ? Due to her infection--seems to be improving and pulse is slowing down--was not feeling these this morning and exam of heart benign, bp now normal - CBC with Differential/Platelet; Future - COMPLETE METABOLIC PANEL WITH GFR; Future - TSH; Future  5. Pure hypercholesterolemia - last LDL had trended up, she's tried to reduce the amount of olive oil she cooks with b/c of this - Lipid panel; Future  6.  Need for influenza:  Vaccine given  Labs/tests ordered:   Orders Placed This Encounter  Procedures  . CBC with Differential/Platelet    Standing Status:   Future    Standing Expiration Date:   12/12/2018  . COMPLETE METABOLIC PANEL WITH GFR    Standing Status:   Future    Standing Expiration Date:   12/12/2018  . Lipid panel    Standing Status:   Future    Standing Expiration Date:   12/12/2018  . TSH    Standing  Status:   Future    Standing Expiration Date:   12/12/2018  . Hemoglobin A1c    Standing Status:   Future    Standing Expiration Date:   12/12/2018    Next appt:  6 mos for CPE with fasting labs before  Narcisa Ganesh L. Jacquline Terrill, D.O. [facility] [address], [place] [address] Cell Phone (Mon-Fri 8am-5pm):  [phone] On Call:  [phone] & follow prompts after 5pm & weekends Office Phone:  [phone] Office Fax:  [phone]

## 2018-04-02 ENCOUNTER — Ambulatory Visit (INDEPENDENT_AMBULATORY_CARE_PROVIDER_SITE_OTHER): Payer: Medicare Other | Admitting: *Deleted

## 2018-04-02 DIAGNOSIS — M810 Age-related osteoporosis without current pathological fracture: Secondary | ICD-10-CM

## 2018-04-02 MED ORDER — DENOSUMAB 60 MG/ML ~~LOC~~ SOSY
60.0000 mg | PREFILLED_SYRINGE | Freq: Once | SUBCUTANEOUS | Status: AC
Start: 2018-04-02 — End: 2018-04-02
  Administered 2018-04-02: 60 mg via SUBCUTANEOUS

## 2018-05-14 ENCOUNTER — Ambulatory Visit: Payer: Medicare Other | Admitting: Internal Medicine

## 2018-05-14 ENCOUNTER — Encounter: Payer: Self-pay | Admitting: Internal Medicine

## 2018-05-14 VITALS — BP 110/70 | HR 85 | Temp 97.7°F | Ht 65.0 in | Wt 110.0 lb

## 2018-05-14 DIAGNOSIS — M810 Age-related osteoporosis without current pathological fracture: Secondary | ICD-10-CM

## 2018-05-14 DIAGNOSIS — H8112 Benign paroxysmal vertigo, left ear: Secondary | ICD-10-CM | POA: Diagnosis not present

## 2018-05-14 NOTE — Progress Notes (Signed)
Location:  [facility] clinic Provider: Rosalind Guido L. Mariea Clonts, D.O., C.M.D.  Goals of Care:  Advanced Directives 06/12/2017  Does Patient Have a Medical Advance Directive? Yes  Type of Advance Directive Adriana [name]  Does patient want to make changes to medical advance directive? No - Patient declined  Copy of [place] in Chart? Yes  Would patient like information on creating a medical advance directive? -     Chief Complaint  Patient presents with  . Acute Visit    dizziness    HPI: Patient is a 78 y.o. [name] seen today for an acute visit for After breakfast on Tuesday, she got dizzy.  She skipped the Y.  Wednesday was better, then Thursday unsteady.  Sat ok, Sunday very bad.  No tinnitus.  When she bends her head down, she feels more dizziness.  Also if she leans backward.  No visual changes.  It did initially feel like the room was spinning Thursday morning.  When got up slowly after that, she was ok.  She feels like she will topple over.  Kind of like being on a ship.  Not lightheadedness.   Her head does not feel exactly right.  No headaches.  A little difficulty with coordination, but feels she may just be more sensitive.  Feels like feet are not coordinating.  No falls.  Holding onto things.  She does mention that her mother died of a brain aneurysm.    Past Medical History:  Diagnosis Date  . Cancer (HCC)    squamous cell face removed  . Cellulitis, toe    osteomylitis  . Cholelithiasis   . Diverticulitis   . Hiatal hernia    hx of 25 years ago  . Hyperlipidemia   . Mitral valve prolapse   . Osteoporosis   . PONV (postoperative nausea and vomiting)     Past Surgical History:  Procedure Laterality Date  . BREAST BIOPSY     Left,negative   . CHOLECYSTECTOMY     11/02/16 Dr. Harlow Asa  . CHOLECYSTECTOMY N/A 11/02/2016   Procedure: LAPAROSCOPIC CHOLECYSTECTOMY WITH INTRAOPERATIVE CHOLANGIOGRAM;  Surgeon: Armandina Gemma, MD;  Location: [facility];   Service: General;  Laterality: N/A;  . COLONOSCOPY  05/2003   No polyps, hemorrhoids,and diverticulosis   . ENDOSCOPIC RETROGRADE CHOLANGIOPANCREATOGRAPHY (ERCP) WITH PROPOFOL N/A 11/04/2016   Procedure: ENDOSCOPIC RETROGRADE CHOLANGIOPANCREATOGRAPHY (ERCP) WITH PROPOFOL;  Surgeon: [name], MD;  Location: [facility];  Service: Gastroenterology;  Laterality: N/A;  . EYE SURGERY     r eye cataract removed    Allergies  Allergen Reactions  . Chlorhexidine Rash    Severe rash after her cholecystectomy and ERCP  . Adhesive [Tape] Rash and Other (See Comments)    Skin irritation with extended exposure.  . Latex Rash and Other (See Comments)    Blistering with extended exposure    Outpatient Encounter Medications as of 05/14/2018  Medication Sig  . Cholecalciferol (VITAMIN D-3) 1000 units CAPS Take 2,000 Units by mouth daily with supper.  . denosumab (PROLIA) 60 MG/ML SOLN injection Inject 60 mg into the skin every 6 (six) months. Administer in upper arm, thigh, or abdomen  . Polyethyl Glycol-Propyl Glycol (LUBRICANT EYE DROPS) 0.4-0.3 % SOLN Place 1 drop into both eyes daily as needed (for dry eyes.).  . [DISCONTINUED] sulfamethoxazole-trimethoprim (BACTRIM DS,SEPTRA DS) 800-160 MG tablet    No facility-administered encounter medications on file as of 05/14/2018.     Review of Systems:  Review of Systems  Constitutional: Negative for chills, fever and malaise/fatigue.  HENT: Negative for hearing loss and tinnitus.        Left ear feels funny, no recent uri  Eyes: Positive for double vision. Negative for blurred vision.       Has had double vision not new  Respiratory: Negative for cough and shortness of breath.   Cardiovascular: Negative for chest pain, palpitations and leg swelling.  Musculoskeletal: Negative for falls.  Skin: Negative for itching and rash.  Neurological: Positive for dizziness. Negative for tingling, tremors, sensory change, speech change, focal weakness,  seizures, loss of consciousness, weakness and headaches.  Psychiatric/Behavioral: The patient is nervous/anxious.        Tearful talking about friend who just had a stroke and passed away    Health Maintenance  Topic Date Due  . MAMMOGRAM  09/05/2018  . TETANUS/TDAP  12/15/2023  . INFLUENZA VACCINE  Completed  . DEXA SCAN  Completed  . PNA vac Low Risk Adult  Completed    Physical Exam: Vitals:   05/14/18 1303  BP: 110/70  Pulse: 85  Temp: 97.7 F (36.5 C)  TempSrc: Oral  SpO2: 97%  Weight: 110 lb (49.9 kg)  Height: 5\' 5"  (1.651 m)   Body mass index is 18.3 kg/m. Physical Exam Vitals signs reviewed.  Constitutional:      General: She is not in acute distress.    Appearance: Normal appearance. She is not toxic-appearing.  HENT:     Head: Normocephalic and atraumatic.     Right Ear: Tympanic membrane, ear canal and external ear normal.     Left Ear: Tympanic membrane, ear canal and external ear normal.     Nose: Nose normal. No congestion.     Mouth/Throat:     Pharynx: Oropharynx is clear.  Eyes:     Extraocular Movements: Extraocular movements intact.     Conjunctiva/sclera: Conjunctivae normal.     Pupils: Pupils are equal, round, and reactive to light.     Comments: Left sided nystagmus  Cardiovascular:     Pulses: Normal pulses.     Heart sounds: Normal heart sounds.  Pulmonary:     Effort: Pulmonary effort is normal.     Breath sounds: Normal breath sounds.  Neurological:     General: No focal deficit present.     Mental Status: She is alert and oriented to person, place, and time.     Comments: Very focused and taking slow steps as she walks, but not staggering to the side, some difficulty coordinating her feet during heel-to-shin, but no difficulty with finger-to-nose  Psychiatric:     Comments: Tearful about loss of friend who had stroke just the other day    Labs reviewed: Basic Metabolic Panel: Recent Labs    06/08/17 0807  NA 138  K 4.9  CL  102  CO2 30  GLUCOSE 105*  BUN 13  CREATININE 0.74  CALCIUM 9.3  MG 2.5   Liver Function Tests: Recent Labs    06/08/17 0807  AST 17  ALT 16  BILITOT 0.5  PROT 7.0   No results for input(s): LIPASE, AMYLASE in the last 8760 hours. No results for input(s): AMMONIA in the last 8760 hours. CBC: Recent Labs    06/08/17 0807  WBC 6.8  NEUTROABS 4,114  HGB 13.7  HCT 40.4  MCV 90.0  PLT 337   Lipid Panel: Recent Labs    06/08/17 0807  CHOL 197  HDL 62  LDLCALC 115*  TRIG 102  CHOLHDL 3.2   Lab Results  Component Value Date   HGBA1C 5.7 (H) 06/08/2017    Procedures since last visit: No results found.  Assessment/Plan 1. Benign paroxysmal positional vertigo of left ear -seems to be based on my examination today -advised to have family member assist her with epley maneuver 3 times a day and to use a walking stick for balance (has this on hand)--I'm very concerned about falls with her osteoporosis -discussed PT, but concern would be transportation to sessions given her current state (lives alone) -son who lives close by or  Daughter in Sports coach in lives in [place] may help her -call back if no better by Thursday in which case I will order CT brain -also call back if she develops new neuro symptoms which were reviewed with her -her son was also notified about need to do maneuvers and use walking stick  2. Senile osteoporosis -cont prolia and regular D3  -hold off on walking until this resolves to avoid falls  Labs/tests ordered:  No orders of the defined types were placed in this encounter.  Next appt:  06/07/2018 CPE--keep as scheduled  Fabiola Mudgett L. Arkeem Harts, D.O. [facility] [address], [place] [address] Cell Phone (Mon-Fri 8am-5pm):  [phone] On Call:  [phone] & follow prompts after 5pm & weekends Office Phone:  [phone] Office Fax:  [phone]

## 2018-05-14 NOTE — Patient Instructions (Addendum)
Please use your walking stick and begin having a family member or friend help you to do the Epley Maneuver to get rid of this vertigo.  If it is not getting better by Thursday, call me back so we can order a picture of your brain.    Benign Positional Vertigo Vertigo is the feeling that you or your surroundings are moving when they are not. Benign positional vertigo is the most common form of vertigo. This is usually a harmless condition (benign). This condition is positional. This means that symptoms are triggered by certain movements and positions. This condition can be dangerous if it occurs while you are doing something that could cause harm to you or others. This includes activities such as driving or operating machinery. What are the causes? In many cases, the cause of this condition is not known. It may be caused by a disturbance in an area of the inner ear that helps your brain to sense movement and balance. This disturbance can be caused by:  Viral infection (labyrinthitis).  Head injury.  Repetitive motion, such as jumping, dancing, or running. What increases the risk? You are more likely to develop this condition if:  You are a woman.  You are 45 years of age or older. What are the signs or symptoms? Symptoms of this condition usually happen when you move your head or your eyes in different directions. Symptoms may start suddenly, and usually last for less than a minute. They include:  Loss of balance and falling.  Feeling like you are spinning or moving.  Feeling like your surroundings are spinning or moving.  Nausea and vomiting.  Blurred vision.  Dizziness.  Involuntary eye movement (nystagmus). Symptoms can be mild and cause only minor problems, or they can be severe and interfere with daily life. Episodes of benign positional vertigo may return (recur) over time. Symptoms may improve over time. How is this diagnosed? This condition may be diagnosed based  on:  Your medical history.  Physical exam of the head, neck, and ears.  Tests, such as: ? MRI. ? CT scan. ? Eye movement tests. Your health care provider may ask you to change positions quickly while he or she watches you for symptoms of benign positional vertigo, such as nystagmus. Eye movement may be tested with a variety of exams that are designed to evaluate or stimulate vertigo. ? An electroencephalogram (EEG). This records electrical activity in your brain. ? Hearing tests. You may be referred to a health care provider who specializes in ear, nose, and throat (ENT) problems (otolaryngologist) or a provider who specializes in disorders of the nervous system (neurologist). How is this treated?  This condition may be treated in a session in which your health care provider moves your head in specific positions to adjust your inner ear back to normal. Treatment for this condition may take several sessions. Surgery may be needed in severe cases, but this is rare. In some cases, benign positional vertigo may resolve on its own in 2-4 weeks. Follow these instructions at home: Safety  Move slowly. Avoid sudden body or head movements or certain positions, as told by your health care provider.  Avoid driving until your health care provider says it is safe for you to do so.  Avoid operating heavy machinery until your health care provider says it is safe for you to do so.  Avoid doing any tasks that would be dangerous to you or others if vertigo occurs.  If you have trouble  walking or keeping your balance, try using a cane for stability. If you feel dizzy or unstable, sit down right away.  Return to your normal activities as told by your health care provider. Ask your health care provider what activities are safe for you. General instructions  Take over-the-counter and prescription medicines only as told by your health care provider.  Drink enough fluid to keep your urine pale  yellow.  Keep all follow-up visits as told by your health care provider. This is important. Contact a health care provider if:  You have a fever.  Your condition gets worse or you develop new symptoms.  Your family or friends notice any behavioral changes.  You have nausea or vomiting that gets worse.  You have numbness or a "pins and needles" sensation. Get help right away if you:  Have difficulty speaking or moving.  Are always dizzy.  Faint.  Develop severe headaches.  Have weakness in your legs or arms.  Have changes in your hearing or vision.  Develop a stiff neck.  Develop sensitivity to light. Summary  Vertigo is the feeling that you or your surroundings are moving when they are not. Benign positional vertigo is the most common form of vertigo.  The cause of this condition is not known. It may be caused by a disturbance in an area of the inner ear that helps your brain to sense movement and balance.  Symptoms include loss of balance and falling, feeling that you or your surroundings are moving, nausea and vomiting, and blurred vision.  This condition can be diagnosed based on symptoms, physical exam, and other tests, such as MRI, CT scan, eye movement tests, and hearing tests.  Follow safety instructions as told by your health care provider. You will also be told when to contact your health care provider in case of problems. This information is not intended to replace advice given to you by your health care provider. Make sure you discuss any questions you have with your health care provider. Document Released: 12/27/2005 Document Revised: 08/30/2017 Document Reviewed: 08/30/2017 Elsevier Interactive Patient Education  2019 [employer]. How to Perform the Epley Maneuver The Epley maneuver is an exercise that relieves symptoms of vertigo. Vertigo is the feeling that you or your surroundings are moving when they are not. When you feel vertigo, you may feel like  the room is spinning and have trouble walking. Dizziness is a little different than vertigo. When you are dizzy, you may feel unsteady or light-headed. You can do this maneuver at home whenever you have symptoms of vertigo. You can do it up to 3 times a day until your symptoms go away. Even though the Epley maneuver may relieve your vertigo for a few weeks, it is possible that your symptoms will return. This maneuver relieves vertigo, but it does not relieve dizziness. What are the risks? If it is done correctly, the Epley maneuver is considered safe. Sometimes it can lead to dizziness or nausea that goes away after a short time. If you develop other symptoms, such as changes in vision, weakness, or numbness, stop doing the maneuver and call your health care provider. How to perform the Epley maneuver 1. Sit on the edge of a bed or table with your back straight and your legs extended or hanging over the edge of the bed or table. 2. Turn your head halfway toward the affected ear or side. 3. Lie backward quickly with your head turned until you are lying flat on  your back. You may want to position a pillow under your shoulders. 4. Hold this position for 30 seconds. You may experience an attack of vertigo. This is normal. 5. Turn your head to the opposite direction until your unaffected ear is facing the floor. 6. Hold this position for 30 seconds. You may experience an attack of vertigo. This is normal. Hold this position until the vertigo stops. 7. Turn your whole body to the same side as your head. Hold for another 30 seconds. 8. Sit back up. You can repeat this exercise up to 3 times a day. Follow these instructions at home:  After doing the Epley maneuver, you can return to your normal activities.  Ask your health care provider if there is anything you should do at home to prevent vertigo. He or she may recommend that you: ? Keep your head raised (elevated) with two or more pillows while you  sleep. ? Do not sleep on the side of your affected ear. ? Get up slowly from bed. ? Avoid sudden movements during the day. ? Avoid extreme head movement, like looking up or bending over. Contact a health care provider if:  Your vertigo gets worse.  You have other symptoms, including: ? Nausea. ? Vomiting. ? Headache. Get help right away if:  You have vision changes.  You have a severe or worsening headache or neck pain.  You cannot stop vomiting.  You have new numbness or weakness in any part of your body. Summary  Vertigo is the feeling that you or your surroundings are moving when they are not.  The Epley maneuver is an exercise that relieves symptoms of vertigo.  If the Epley maneuver is done correctly, it is considered safe. You can do it up to 3 times a day. This information is not intended to replace advice given to you by your health care provider. Make sure you discuss any questions you have with your health care provider. Document Released: 03/26/2013 Document Revised: 02/09/2016 Document Reviewed: 02/09/2016 Elsevier Interactive Patient Education  2019 [employer].

## 2018-05-27 IMAGING — US US ABDOMEN LIMITED
1 series · 14 of 25 positions shown · non-contrast
Comparison: None.

CLINICAL DATA: Right upper quadrant pain for 10 years.

EXAM:
ULTRASOUND ABDOMEN LIMITED RIGHT UPPER QUADRANT

[Series 1: us abdomen limited · 0.17mm/px · 14 of 48 slices shown]
[im 1/48]
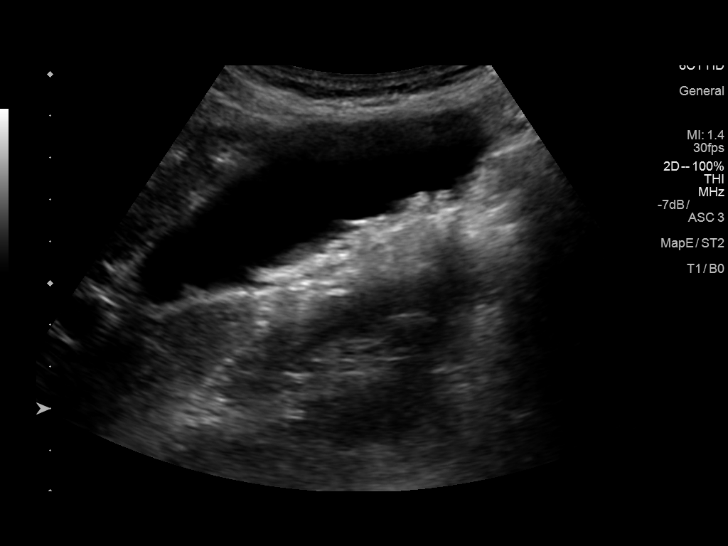
[im 4/48]
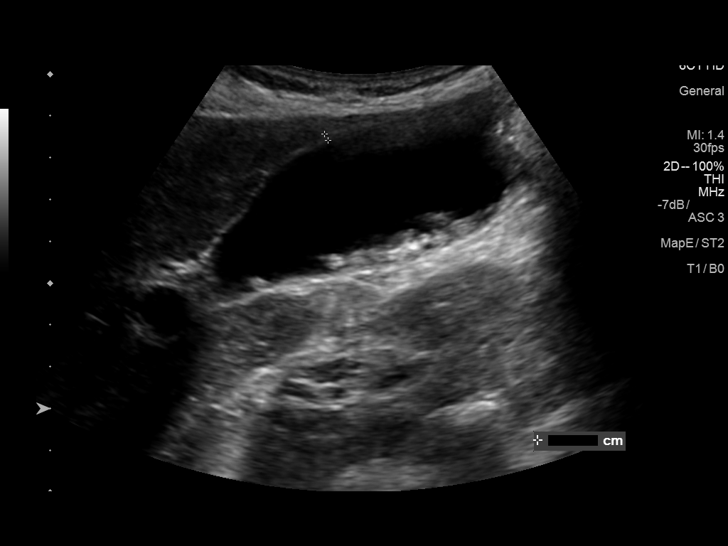
[im 8/48]
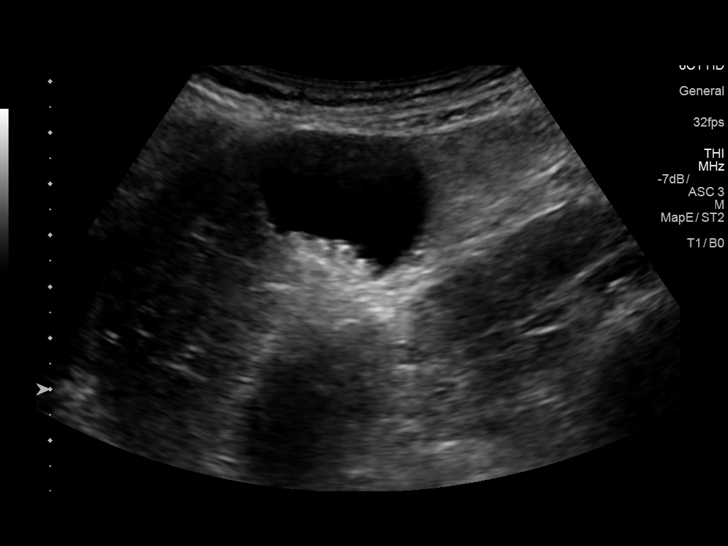
[im 12/48]
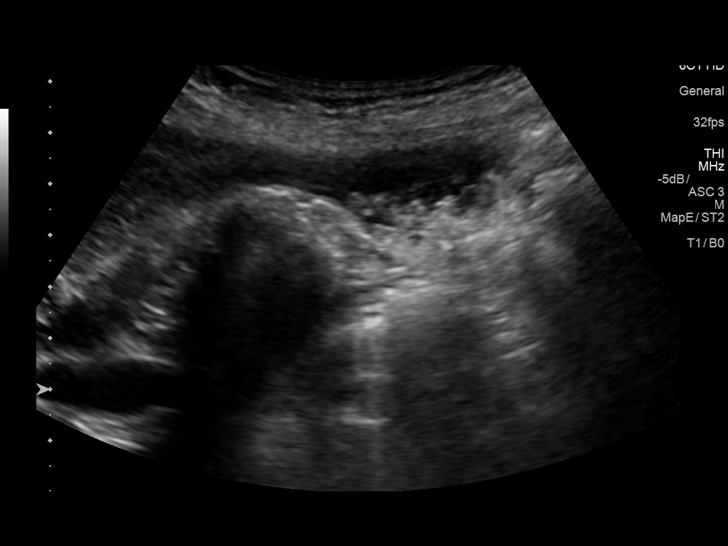
[im 16/48]
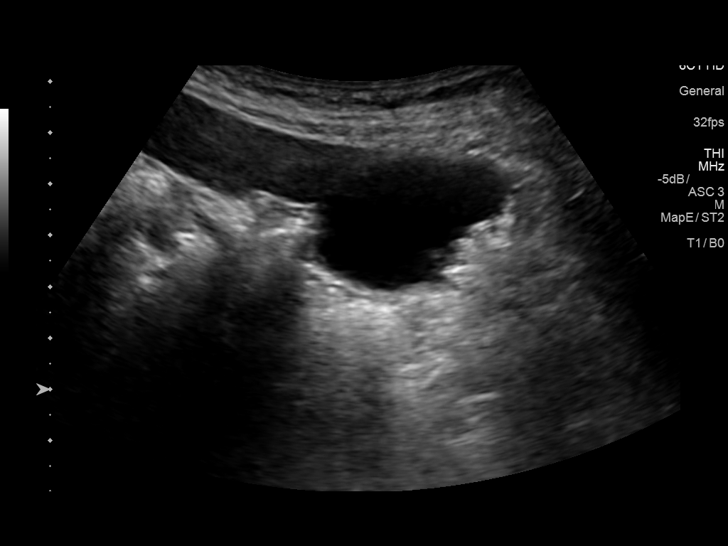
[im 18/48]
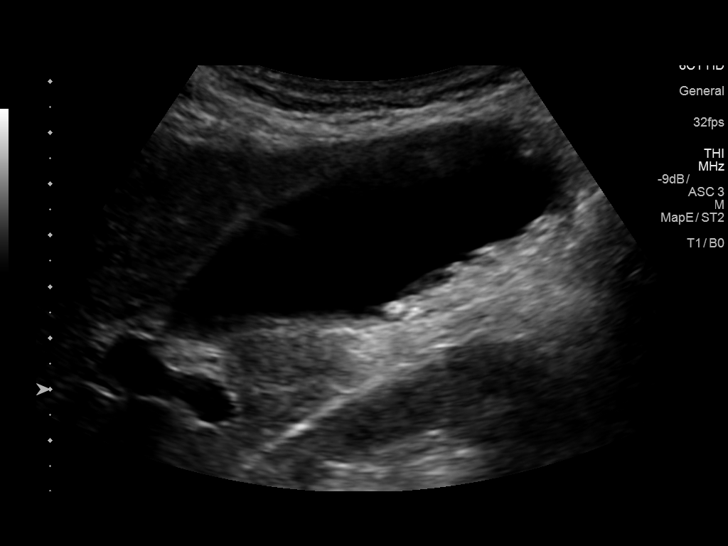
[im 22/48]
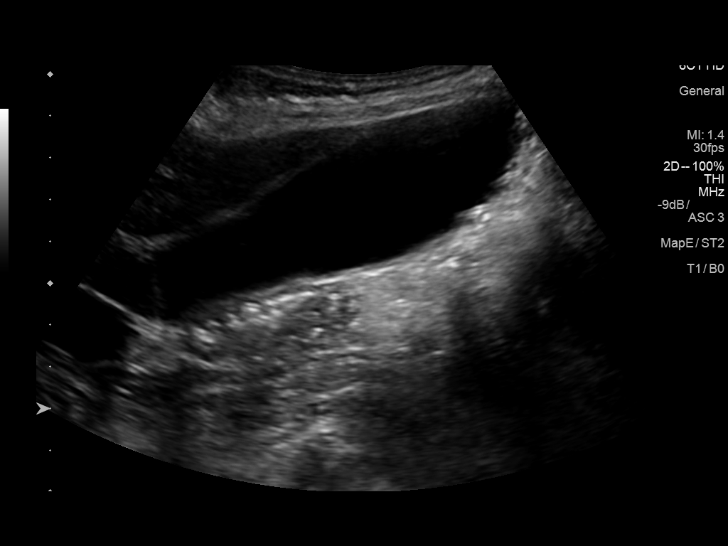
[im 26/48]
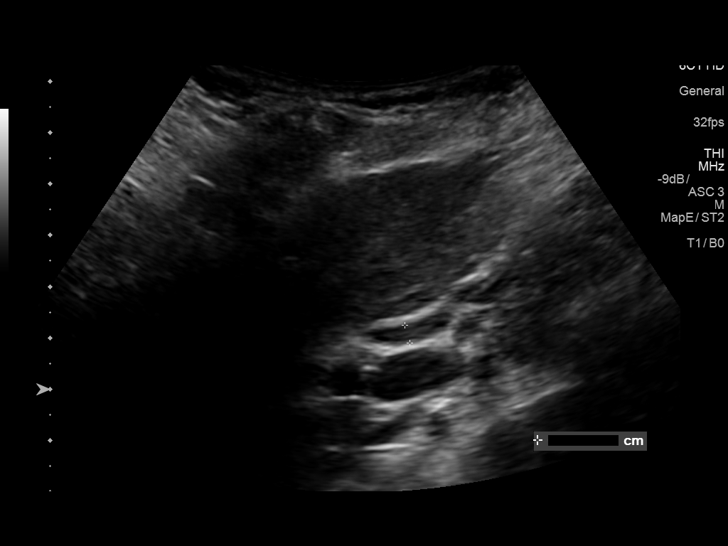
[im 30/48]
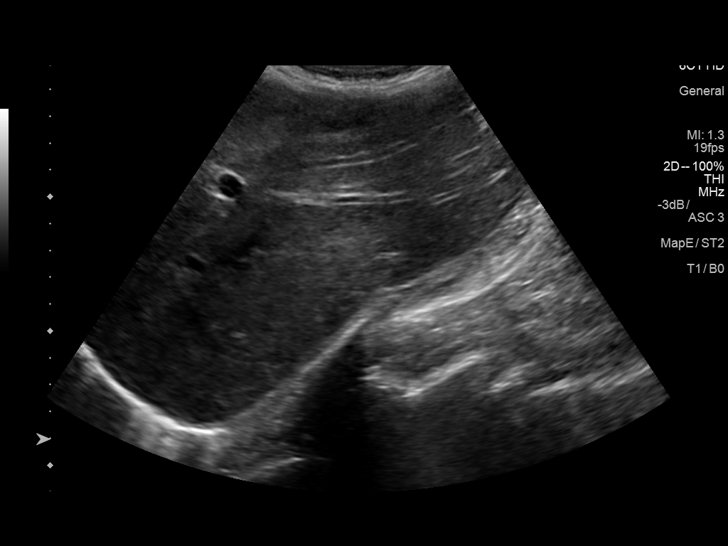
[im 32/48]
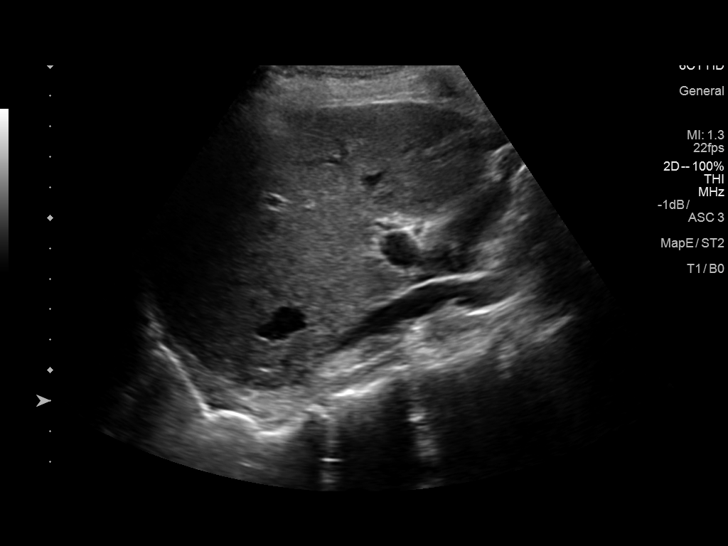
[im 36/48]
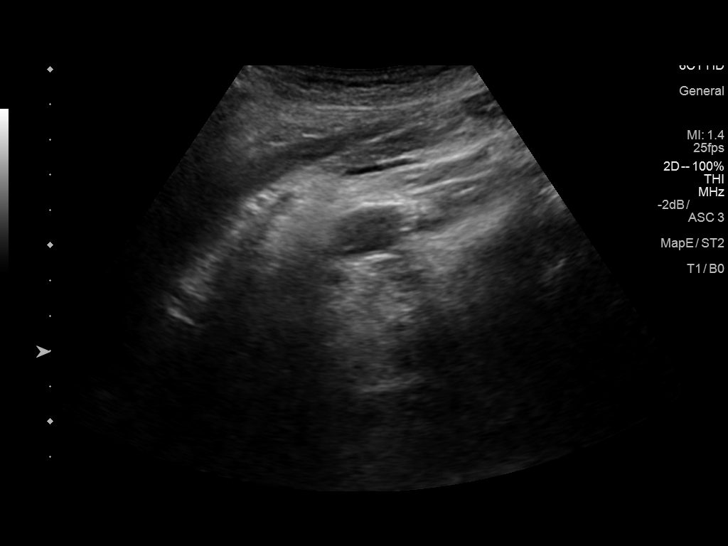
[im 40/48]
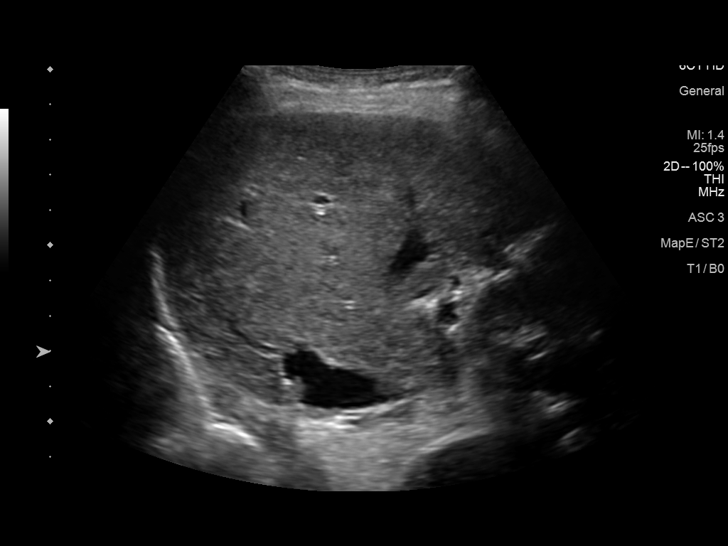
[im 44/48]
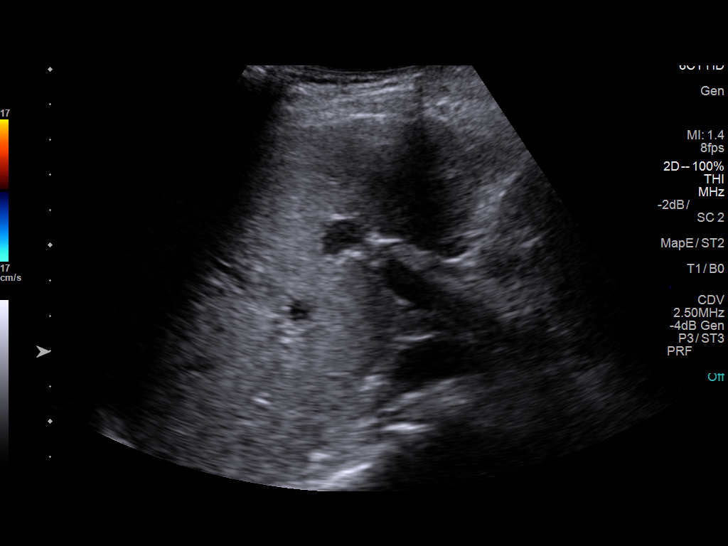
[im 48/48]
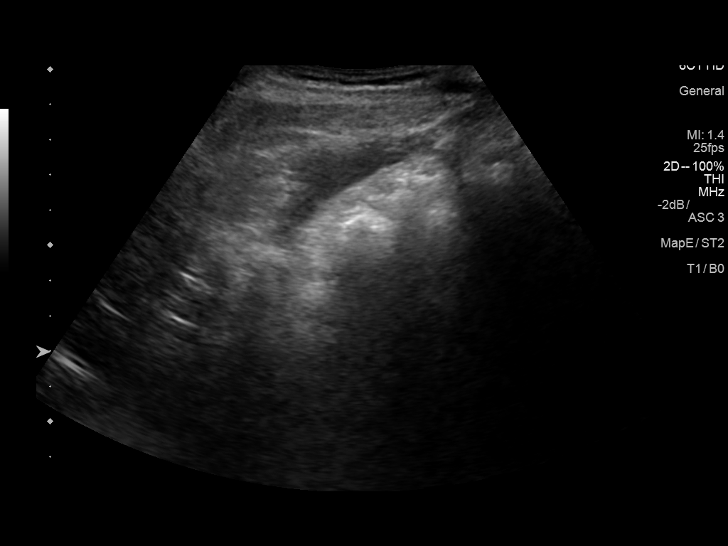

[14 of 25 positions shown; findings below may reference images not displayed]

FINDINGS: Gallbladder:

Multiple small layering gallstones. No gallbladder wall thickening
or focal tenderness. No pericholecystic edema.

Common bile duct:

Diameter: 3 mm.  Where visualized, no filling defect.

Liver:

No focal lesion identified. Prominent right liver length, but
overall grossly normal liver size. Within normal limits in
parenchymal echogenicity. Antegrade flow in the main portal vein.
IMPRESSION: 1. Multiple small gallstones.
2. No acute finding.

## 2018-06-06 ENCOUNTER — Other Ambulatory Visit: Payer: Self-pay

## 2018-06-06 DIAGNOSIS — R739 Hyperglycemia, unspecified: Secondary | ICD-10-CM

## 2018-06-06 DIAGNOSIS — R002 Palpitations: Secondary | ICD-10-CM

## 2018-06-06 DIAGNOSIS — E7800 Pure hypercholesterolemia, unspecified: Secondary | ICD-10-CM

## 2018-06-07 ENCOUNTER — Other Ambulatory Visit: Payer: Medicare Other

## 2018-06-08 LAB — COMPLETE METABOLIC PANEL WITH GFR
AG Ratio: 1.8 (calc) (ref 1.0–2.5)
ALT: 13 U/L (ref 6–29)
AST: 18 U/L (ref 10–35)
Albumin: 4.4 g/dL (ref 3.6–5.1)
Alkaline phosphatase (APISO): 51 U/L (ref 37–153)
BUN: 12 mg/dL (ref 7–25)
CO2: 32 mmol/L (ref 20–32)
Calcium: 9.7 mg/dL (ref 8.6–10.4)
Chloride: 102 mmol/L (ref 98–110)
Creat: 0.68 mg/dL (ref 0.60–0.93)
GFR, Est African American: 97 mL/min/{1.73_m2} (ref >=60)
GFR, Est Non African American: 84 mL/min/{1.73_m2} (ref >=60)
Globulin: 2.4 g/dL (calc) (ref 1.9–3.7)
Glucose, Bld: 94 mg/dL (ref 65–99)
Potassium: 4.4 mmol/L (ref 3.5–5.3)
Sodium: 139 mmol/L (ref 135–146)
Total Bilirubin: 0.7 mg/dL (ref 0.2–1.2)
Total Protein: 6.8 g/dL (ref 6.1–8.1)

## 2018-06-08 LAB — CBC WITH DIFFERENTIAL/PLATELET
Absolute Monocytes: 490 cells/uL (ref 200–950)
Basophils Absolute: 57 cells/uL (ref 0–200)
Basophils Relative: 0.8 %
Eosinophils Absolute: 149 cells/uL (ref 15–500)
Eosinophils Relative: 2.1 %
HCT: 39.3 % (ref 35.0–45.0)
Hemoglobin: 13.1 g/dL (ref 11.7–15.5)
Lymphs Abs: 1505 cells/uL (ref 850–3900)
MCH: 30.5 pg (ref 27.0–33.0)
MCHC: 33.3 g/dL (ref 32.0–36.0)
MCV: 91.4 fL (ref 80.0–100.0)
MPV: 10.2 fL (ref 7.5–12.5)
Monocytes Relative: 6.9 %
Neutro Abs: 4899 cells/uL (ref 1500–7800)
Neutrophils Relative %: 69 %
Platelets: 285 10*3/uL (ref 140–400)
RBC: 4.3 10*6/uL (ref 3.80–5.10)
RDW: 12.3 % (ref 11.0–15.0)
Total Lymphocyte: 21.2 %
WBC: 7.1 10*3/uL (ref 3.8–10.8)

## 2018-06-08 LAB — LIPID PANEL
Cholesterol: 180 mg/dL (ref <200)
HDL: 72 mg/dL (ref >=50)
LDL Cholesterol (Calc): 91 mg/dL (calc)
Non-HDL Cholesterol (Calc): 108 mg/dL (calc) (ref <130)
Total CHOL/HDL Ratio: 2.5 (calc) (ref <5.0)
Triglycerides: 84 mg/dL (ref <150)

## 2018-06-08 LAB — HEMOGLOBIN A1C
Hgb A1c MFr Bld: 5.6 % of total Hgb (ref <5.7)
Mean Plasma Glucose: 114 (calc)
eAG (mmol/L): 6.3 (calc)

## 2018-06-08 LAB — TSH: TSH: 1.52 mIU/L (ref 0.40–4.50)

## 2018-06-11 ENCOUNTER — Ambulatory Visit: Payer: Medicare Other | Admitting: Internal Medicine

## 2018-06-15 ENCOUNTER — Encounter: Payer: Self-pay | Admitting: Family

## 2018-06-15 ENCOUNTER — Ambulatory Visit (INDEPENDENT_AMBULATORY_CARE_PROVIDER_SITE_OTHER): Payer: Medicare Other | Admitting: Family

## 2018-06-15 ENCOUNTER — Ambulatory Visit: Payer: Self-pay

## 2018-06-15 ENCOUNTER — Other Ambulatory Visit: Payer: Self-pay

## 2018-06-15 VITALS — BP 112/80 | HR 82 | Temp 97.8°F | Ht 65.0 in | Wt 109.2 lb

## 2018-06-15 DIAGNOSIS — Z23 Encounter for immunization: Secondary | ICD-10-CM | POA: Diagnosis not present

## 2018-06-15 DIAGNOSIS — Z0000 Encounter for general adult medical examination without abnormal findings: Secondary | ICD-10-CM

## 2018-06-15 MED ORDER — ZOSTER VAC RECOMB ADJUVANTED 50 MCG/0.5ML IM SUSR: 0.5000 mL | Freq: Once | INTRAMUSCULAR | 0 refills | Status: AC

## 2018-06-15 NOTE — Progress Notes (Signed)
Subjective:   Adriana [name] is a 78 y.o. female who presents for Medicare Annual (Subsequent) preventive examination.  Review of Systems:   Cardiac Risk Factors include: advanced age (>67men, >20 women);dyslipidemia     Objective:     Vitals: BP 112/80   Pulse 82   Temp 97.8 F (36.6 C) (Oral)   Ht 5\' 5"  (1.651 m)   Wt 109 lb 3.2 oz (49.5 kg)   SpO2 98%   BMI 18.17 kg/m   Body mass index is 18.17 kg/m.  Advanced Directives 06/15/2018 06/12/2017 06/12/2017 02/09/2017 11/04/2016 11/02/2016 10/24/2016  Does Patient Have a Medical Advance Directive? Yes Yes Yes Yes No No Yes  Type of Arts administrator Power of [place];Living will  Does patient want to make changes to medical advance directive? No - Patient declined No - Patient declined No - Patient declined No - Patient declined - - -  Copy of [place] in Chart? Yes - validated most recent copy scanned in chart (See row information) Yes Yes Yes - - No - copy requested  Would patient like information on creating a medical advance directive? - - - - - No - Patient declined -    Tobacco Social History   Tobacco Use  Smoking Status Never Smoker  Smokeless Tobacco Never Used     Counseling given: Not Answered   Clinical Intake:  Pre-visit preparation completed: No  Pain : No/denies pain     BMI - recorded: 18.17 Nutritional Status: BMI <19  Underweight Nutritional Risks: None Diabetes: No  How often do you need to have someone help you when you read instructions, pamphlets, or other written materials from your doctor or pharmacy?: 1 - Never What is the last grade level you completed in school?: master degree   Interpreter Needed?: No  Information entered by :: Rainna Nearhood FNP-C   Past Medical History:  Diagnosis Date  . Cancer (HCC)    squamous cell face removed   . Cellulitis, toe    osteomylitis  . Cholelithiasis   . Diverticulitis   . Heart murmur    mitral valve proplase   . Hiatal hernia    hx of 25 years ago  . Hyperlipidemia   . Mitral valve prolapse   . Osteoporosis   . PONV (postoperative nausea and vomiting)    Past Surgical History:  Procedure Laterality Date  . BREAST BIOPSY     Left,negative   . CHOLECYSTECTOMY     11/02/16 Dr. Harlow Asa  . CHOLECYSTECTOMY N/A 11/02/2016   Procedure: LAPAROSCOPIC CHOLECYSTECTOMY WITH INTRAOPERATIVE CHOLANGIOGRAM;  Surgeon: Armandina Gemma, MD;  Location: [facility];  Service: General;  Laterality: N/A;  . COLONOSCOPY  05/2003   No polyps, hemorrhoids,and diverticulosis   . ENDOSCOPIC RETROGRADE CHOLANGIOPANCREATOGRAPHY (ERCP) WITH PROPOFOL N/A 11/04/2016   Procedure: ENDOSCOPIC RETROGRADE CHOLANGIOPANCREATOGRAPHY (ERCP) WITH PROPOFOL;  Surgeon: [name], MD;  Location: [facility];  Service: Gastroenterology;  Laterality: N/A;  . EYE SURGERY     r eye cataract removed   Family History  Problem Relation Age of Onset  . Pneumonia Father 73  . Cerebrovascular Accident Mother 68  . Stroke Mother   . Breast cancer Sister 98   Social History   Socioeconomic History  . Marital status: Widowed    Spouse name: Not on file  . Number of children: Not on file  . Years  of education: Not on file  . Highest education level: Not on file  Occupational History  . Not on file  Social Needs  . Financial resource strain: Not hard at all  . Food insecurity:    Worry: Never true    Inability: Never true  . Transportation needs:    Medical: No    Non-medical: No  Tobacco Use  . Smoking status: Never Smoker  . Smokeless tobacco: Never Used  Substance and Sexual Activity  . Alcohol use: No    Frequency: Never  . Drug use: No  . Sexual activity: Never  Lifestyle  . Physical activity:    Days per week: 3 days    Minutes per session: 30 min  . Stress: Not at all  Relationships  . Social connections:     Talks on phone: More than three times a week    Gets together: More than three times a week    Attends religious service: More than 4 times per year    Active member of club or organization: No    Attends meetings of clubs or organizations: Never    Relationship status: Widowed  Other Topics Concern  . Not on file  Social History Narrative   Dr.Stoneburner, Ophthalmologist   Dr.Peterson, Urologist   Dr.Lomax, Dermatologist      Outpatient Encounter Medications as of 06/15/2018  Medication Sig  . Cholecalciferol (VITAMIN D-3) 1000 units CAPS Take 2,000 Units by mouth daily with supper.  . denosumab (PROLIA) 60 MG/ML SOLN injection Inject 60 mg into the skin every 6 (six) months. Administer in upper arm, thigh, or abdomen  . Polyethyl Glycol-Propyl Glycol (LUBRICANT EYE DROPS) 0.4-0.3 % SOLN Place 1 drop into both eyes daily as needed (for dry eyes.).   No facility-administered encounter medications on file as of 06/15/2018.     Activities of Daily Living In your present state of health, do you have any difficulty performing the following activities: 06/15/2018  Hearing? N  Vision? N  Difficulty concentrating or making decisions? N  Walking or climbing stairs? N  Dressing or bathing? N  Doing errands, shopping? N  Preparing Food and eating ? N  Using the Toilet? N  In the past six months, have you accidently leaked urine? N  Do you have problems with loss of bowel control? N  Managing your Medications? N  Managing your Finances? N  Housekeeping or managing your Housekeeping? N  Some recent data might be hidden    Patient Care Team: Gayland Curry, DO as PCP - General (Geriatric Medicine) Shon Hough, MD as Consulting Physician (Ophthalmology) Gevena Cotton, MD as Consulting Physician (Ophthalmology)    Assessment:   This is a routine wellness examination for Adriana [name].  Exercise Activities and Dietary recommendations Current Exercise Habits: Structured exercise  class, Type of exercise: walking;strength training/weights;stretching, Time (Minutes): 60, Frequency (Times/Week): 3, Weekly Exercise (Minutes/Week): 180, Intensity: Moderate  Goals    . <enter goal here>     Starting 06/06/16, I will maintain my current lifestyle.        Fall Risk Fall Risk  06/15/2018 05/14/2018 12/11/2017 06/12/2017 02/09/2017  Falls in the past year? 0 0 No No No  Number falls in past yr: 0 0 - - -  Injury with Fall? 0 0 - - -  Risk for fall due to : - Impaired balance/gait - - -  Follow up - Falls prevention discussed;Education provided;Falls evaluation completed - - -   Is the patient's home  free of loose throw rugs in walkways, pet beds, electrical cords, etc?   yes      Grab bars in the bathroom? yes      Handrails on the stairs?   yes      Adequate lighting?   yes  Depression Screen PHQ 2/9 Scores 06/15/2018 05/14/2018 12/11/2017 06/12/2017  PHQ - 2 Score 0 0 0 0  Exception Documentation - - - -  Not completed - - - -     Cognitive Function MMSE - Mini Mental State Exam 06/15/2018 06/12/2017 06/06/2016 06/08/2015  Orientation to time 5 5 5 5   Orientation to Place 5 5 5 5   Registration 3 3 3 3   Attention/ Calculation 5 5 5 5   Recall 3 3 3 3   Language- name 2 objects 2 2 2 2   Language- repeat 1 1 1 1   Language- follow 3 step command 3 3 3 3   Language- read & follow direction 1 1 1 1   Write a sentence 1 1 1 1   Copy design 1 1 1 1   Total score 30 30 30 30         Immunization History  Administered Date(s) Administered  . Influenza Split 01/30/2012  . Influenza, High Dose Seasonal PF 02/09/2017, 12/11/2017  . Influenza, Seasonal, Injecte, Preservative Fre 01/03/2015  . Influenza,inj,Quad PF,6+ Mos 01/08/2013, 12/08/2015  . Influenza-Unspecified 01/03/2014  . Pneumococcal Conjugate-13 06/06/2014  . Pneumococcal Polysaccharide-23 06/02/2005  . Td 04/04/2002  . Tdap 12/14/2013  . Zoster 04/04/2009    Qualifies for Shingles Vaccine? Requires second dose    Screening Tests Health Maintenance  Topic Date Due  . MAMMOGRAM  09/05/2018  . TETANUS/TDAP  12/15/2023  . INFLUENZA VACCINE  Completed  . DEXA SCAN  Completed  . PNA vac Low Risk Adult  Completed    Cancer Screenings: Lung: Low Dose CT Chest recommended if Age 74-80 years, 30 pack-year currently smoking OR have quit w/in 15years. Patient does not qualify. Breast:  Up to date on Mammogram? Due 09/2018  Up to date of Bone Density/Dexa? Yes Colorectal:? Does not recall will call to notify provider.   Additional Screenings:  Hepatitis C Screening: low risk      Plan:  - Low carbohydrate,low saturated fats,high vegetable diet and exercise as tolerated. - second shinrex vaccine order this visit  - Patient to verify colonoscopy date then notify provider's office  I have personally reviewed and noted the following in the patient's chart:   . Medical and social history . Use of alcohol, tobacco or illicit drugs  . Current medications and supplements . Functional ability and status . Nutritional status . Physical activity . Advanced directives . List of other physicians . Hospitalizations, surgeries, and ER visits in previous 12 months . Vitals . Screenings to include cognitive, depression, and falls . Referrals and appointments  In addition, I have reviewed and discussed with patient certain preventive protocols, quality metrics, and best practice recommendations. A written personalized care plan for preventive services as well as general preventive health recommendations were provided to patient.   Sandrea Hughs, NP  06/15/2018

## 2018-06-15 NOTE — Patient Instructions (Signed)
Adriana [name] , Thank you for taking time to come for your Medicare Wellness Visit. I appreciate your ongoing commitment to your health goals. Please review the following plan we discussed and let me know if I can assist you in the future.   Screening recommendations/referrals: Colonoscopy: call provider's office once you verify records of colonoscopy date. Mammogram: Due 09/2018 Bone Density: Up to date  Recommended yearly ophthalmology/optometry visit for glaucoma screening and checkup Recommended yearly dental visit for hygiene and checkup  Vaccinations: Influenza vaccine: Up to date  Pneumococcal vaccine: Up to date  Tdap vaccine: Up to date due next 2025  Shingles vaccine: Second dose ordered today   Advanced directives:Copy in chart   Conditions/risks identified: Advance age > 69 years old, Hyperlipidemia   Next appointment: 1 year   Preventive Care 22 Years and Older, Female Preventive care refers to lifestyle choices and visits with your health care provider that can promote health and wellness. What does preventive care include?  A yearly physical exam. This is also called an annual well check.  Dental exams once or twice a year.  Routine eye exams. Ask your health care provider how often you should have your eyes checked.  Personal lifestyle choices, including:  Daily care of your teeth and gums.  Regular physical activity.  Eating a healthy diet.  Avoiding tobacco and drug use.  Limiting alcohol use.  Practicing safe sex.  Taking low-dose aspirin every day.  Taking vitamin and mineral supplements as recommended by your health care provider. What happens during an annual well check? The services and screenings done by your health care provider during your annual well check will depend on your age, overall health, lifestyle risk factors, and family history of disease. Counseling  Your health care provider may ask you questions about your:  Alcohol use.   Tobacco use.  Drug use.  Emotional well-being.  Home and relationship well-being.  Sexual activity.  Eating habits.  History of falls.  Memory and ability to understand (cognition).  Work and work Statistician.  Reproductive health. Screening  You may have the following tests or measurements:  Height, weight, and BMI.  Blood pressure.  Lipid and cholesterol levels. These may be checked every 5 years, or more frequently if you are over 57 years old.  Skin check.  Lung cancer screening. You may have this screening every year starting at age 69 if you have a 30-pack-year history of smoking and currently smoke or have quit within the past 15 years.  Fecal occult blood test (FOBT) of the stool. You may have this test every year starting at age 63.  Flexible sigmoidoscopy or colonoscopy. You may have a sigmoidoscopy every 5 years or a colonoscopy every 10 years starting at age 55.  Hepatitis C blood test.  Hepatitis B blood test.  Sexually transmitted disease (STD) testing.  Diabetes screening. This is done by checking your blood sugar (glucose) after you have not eaten for a while (fasting). You may have this done every 1-3 years.  Bone density scan. This is done to screen for osteoporosis. You may have this done starting at age 41.  Mammogram. This may be done every 1-2 years. Talk to your health care provider about how often you should have regular mammograms. Talk with your health care provider about your test results, treatment options, and if necessary, the need for more tests. Vaccines  Your health care provider may recommend certain vaccines, such as:  Influenza vaccine. This is recommended  every year.  Tetanus, diphtheria, and acellular pertussis (Tdap, Td) vaccine. You may need a Td booster every 10 years.  Zoster vaccine. You may need this after age 22.  Pneumococcal 13-valent conjugate (PCV13) vaccine. One dose is recommended after age 56.  Pneumococcal  polysaccharide (PPSV23) vaccine. One dose is recommended after age 61. Talk to your health care provider about which screenings and vaccines you need and how often you need them. This information is not intended to replace advice given to you by your health care provider. Make sure you discuss any questions you have with your health care provider. Document Released: 04/17/2015 Document Revised: 12/09/2015 Document Reviewed: 01/20/2015 Elsevier Interactive Patient Education  2017 [place] Prevention in the Home Falls can cause injuries. They can happen to people of all ages. There are many things you can do to make your home safe and to help prevent falls. What can I do on the outside of my home?  Regularly fix the edges of walkways and driveways and fix any cracks.  Remove anything that might make you trip as you walk through a door, such as a raised step or threshold.  Trim any bushes or trees on the path to your home.  Use bright outdoor lighting.  Clear any walking paths of anything that might make someone trip, such as rocks or tools.  Regularly check to see if handrails are loose or broken. Make sure that both sides of any steps have handrails.  Any raised decks and porches should have guardrails on the edges.  Have any leaves, snow, or ice cleared regularly.  Use sand or salt on walking paths during winter.  Clean up any spills in your garage right away. This includes oil or grease spills. What can I do in the bathroom?  Use night lights.  Install grab bars by the toilet and in the tub and shower. Do not use towel bars as grab bars.  Use non-skid mats or decals in the tub or shower.  If you need to sit down in the shower, use a plastic, non-slip stool.  Keep the floor dry. Clean up any water that spills on the floor as soon as it happens.  Remove soap buildup in the tub or shower regularly.  Attach bath mats securely with double-sided non-slip rug tape.   Do not have throw rugs and other things on the floor that can make you trip. What can I do in the bedroom?  Use night lights.  Make sure that you have a light by your bed that is easy to reach.  Do not use any sheets or blankets that are too big for your bed. They should not hang down onto the floor.  Have a firm chair that has side arms. You can use this for support while you get dressed.  Do not have throw rugs and other things on the floor that can make you trip. What can I do in the kitchen?  Clean up any spills right away.  Avoid walking on wet floors.  Keep items that you use a lot in easy-to-reach places.  If you need to reach something above you, use a strong step stool that has a grab bar.  Keep electrical cords out of the way.  Do not use floor polish or wax that makes floors slippery. If you must use wax, use non-skid floor wax.  Do not have throw rugs and other things on the floor that can make you trip. What  can I do with my stairs?  Do not leave any items on the stairs.  Make sure that there are handrails on both sides of the stairs and use them. Fix handrails that are broken or loose. Make sure that handrails are as long as the stairways.  Check any carpeting to make sure that it is firmly attached to the stairs. Fix any carpet that is loose or worn.  Avoid having throw rugs at the top or bottom of the stairs. If you do have throw rugs, attach them to the floor with carpet tape.  Make sure that you have a light switch at the top of the stairs and the bottom of the stairs. If you do not have them, ask someone to add them for you. What else can I do to help prevent falls?  Wear shoes that:  Do not have high heels.  Have rubber bottoms.  Are comfortable and fit you well.  Are closed at the toe. Do not wear sandals.  If you use a stepladder:  Make sure that it is fully opened. Do not climb a closed stepladder.  Make sure that both sides of the  stepladder are locked into place.  Ask someone to hold it for you, if possible.  Clearly mark and make sure that you can see:  Any grab bars or handrails.  First and last steps.  Where the edge of each step is.  Use tools that help you move around (mobility aids) if they are needed. These include:  Canes.  Walkers.  Scooters.  Crutches.  Turn on the lights when you go into a dark area. Replace any light bulbs as soon as they burn out.  Set up your furniture so you have a clear path. Avoid moving your furniture around.  If any of your floors are uneven, fix them.  If there are any pets around you, be aware of where they are.  Review your medicines with your doctor. Some medicines can make you feel dizzy. This can increase your chance of falling. Ask your doctor what other things that you can do to help prevent falls. This information is not intended to replace advice given to you by your health care provider. Make sure you discuss any questions you have with your health care provider. Document Released: 01/15/2009 Document Revised: 08/27/2015 Document Reviewed: 04/25/2014 Elsevier Interactive Patient Education  2017 [employer].

## 2018-06-18 ENCOUNTER — Encounter: Payer: Self-pay | Admitting: Internal Medicine

## 2018-06-18 ENCOUNTER — Ambulatory Visit (INDEPENDENT_AMBULATORY_CARE_PROVIDER_SITE_OTHER): Payer: Medicare Other | Admitting: Internal Medicine

## 2018-06-18 ENCOUNTER — Other Ambulatory Visit: Payer: Self-pay

## 2018-06-18 VITALS — BP 120/80 | HR 88 | Temp 97.9°F | Ht 65.0 in | Wt 109.0 lb

## 2018-06-18 DIAGNOSIS — Z0000 Encounter for general adult medical examination without abnormal findings: Secondary | ICD-10-CM | POA: Diagnosis not present

## 2018-06-18 DIAGNOSIS — E7800 Pure hypercholesterolemia, unspecified: Secondary | ICD-10-CM | POA: Diagnosis not present

## 2018-06-18 DIAGNOSIS — R002 Palpitations: Secondary | ICD-10-CM

## 2018-06-18 DIAGNOSIS — H8112 Benign paroxysmal vertigo, left ear: Secondary | ICD-10-CM

## 2018-06-18 DIAGNOSIS — R739 Hyperglycemia, unspecified: Secondary | ICD-10-CM

## 2018-06-18 DIAGNOSIS — R636 Underweight: Secondary | ICD-10-CM

## 2018-06-18 DIAGNOSIS — Z681 Body mass index (BMI) 19 or less, adult: Secondary | ICD-10-CM

## 2018-06-18 DIAGNOSIS — M810 Age-related osteoporosis without current pathological fracture: Secondary | ICD-10-CM

## 2018-06-18 NOTE — Progress Notes (Signed)
Provider:  Rexene Edison. Mariea Clonts, D.O., C.M.D. Location:    [place]   Place of Service:    clinic  Previous PCP: Gayland Curry, DO Patient Care Team: Gayland Curry, DO as PCP - General (Geriatric Medicine) Shon Hough, MD as Consulting Physician (Ophthalmology) Gevena Cotton, MD as Consulting Physician (Ophthalmology)  Extended Emergency Contact Information Primary Emergency Contact: [facility] Address: [address]          [place], [place] [address] Johnnette Litter of [place] Phone: [phone] Relation: Son Secondary Emergency Contact: [facility] Address: [address]          [place], [place] [address] Johnnette Litter of [place] Phone: [phone] Relation: Son  Code Status: DNR Goals of Care: Advanced Directive information Advanced Directives 06/15/2018  Does Patient Have a Medical Advance Directive? Yes  Type of Advance Directive [place]  Does patient want to make changes to medical advance directive? No - Patient declined  Copy of [place] in Chart? Yes - validated most recent copy scanned in chart (See row information)  Would patient like information on creating a medical advance directive? -      Chief Complaint  Patient presents with  . Annual Exam    CPE    HPI: Patient is a 78 y.o. female seen today for an annual physical exam.  Stayed home for a week or so doing epley maneuver.  2 weeks later, she decided she was better.  It was improving by later in the week.  No nausea anymore.    Does have am palpitations, but they typically resolve by end of the am.  Had ekg last year due to this with paroxysmal beats, but not afib.  She continues on prolia and vitamin D for her osteoporosis.    Past Medical History:  Diagnosis Date  . Cancer (HCC)    squamous cell face removed  . Cellulitis, toe    osteomylitis  . Cholelithiasis   . Diverticulitis   . Heart murmur    mitral valve proplase   . Hiatal  hernia    hx of 25 years ago  . Hyperlipidemia   . Mitral valve prolapse   . Osteoporosis   . PONV (postoperative nausea and vomiting)    Past Surgical History:  Procedure Laterality Date  . BREAST BIOPSY     Left,negative   . CHOLECYSTECTOMY     11/02/16 Dr. Harlow Asa  . CHOLECYSTECTOMY N/A 11/02/2016   Procedure: LAPAROSCOPIC CHOLECYSTECTOMY WITH INTRAOPERATIVE CHOLANGIOGRAM;  Surgeon: Armandina Gemma, MD;  Location: [facility];  Service: General;  Laterality: N/A;  . COLONOSCOPY  05/2003   No polyps, hemorrhoids,and diverticulosis   . ENDOSCOPIC RETROGRADE CHOLANGIOPANCREATOGRAPHY (ERCP) WITH PROPOFOL N/A 11/04/2016   Procedure: ENDOSCOPIC RETROGRADE CHOLANGIOPANCREATOGRAPHY (ERCP) WITH PROPOFOL;  Surgeon: [name], MD;  Location: [facility];  Service: Gastroenterology;  Laterality: N/A;  . EYE SURGERY     r eye cataract removed    reports that she has never smoked. She has never used smokeless tobacco. She reports that she does not drink alcohol or use drugs.  Functional Status Survey:    Family History  Problem Relation Age of Onset  . Pneumonia Father 30  . Cerebrovascular Accident Mother 46  . Stroke Mother   . Breast cancer Sister 54    Health Maintenance  Topic Date Due  . MAMMOGRAM  09/05/2018  . TETANUS/TDAP  12/15/2023  . INFLUENZA VACCINE  Completed  . DEXA SCAN  Completed  .  PNA vac Low Risk Adult  Completed    Allergies  Allergen Reactions  . Chlorhexidine Rash    Severe rash after her cholecystectomy and ERCP  . Adhesive [Tape] Rash and Other (See Comments)    Skin irritation with extended exposure.  . Latex Rash and Other (See Comments)    Blistering with extended exposure    Outpatient Encounter Medications as of 06/18/2018  Medication Sig  . Cholecalciferol (VITAMIN D-3) 1000 units CAPS Take 2,000 Units by mouth daily with supper.  . denosumab (PROLIA) 60 MG/ML SOLN injection Inject 60 mg into the skin every 6 (six) months. Administer in upper arm,  thigh, or abdomen  . Polyethyl Glycol-Propyl Glycol (LUBRICANT EYE DROPS) 0.4-0.3 % SOLN Place 1 drop into both eyes daily as needed (for dry eyes.).   No facility-administered encounter medications on file as of 06/18/2018.     Review of Systems  Constitutional: Negative for chills, fever and malaise/fatigue.       Tired today after trimming bushes all week  HENT: Negative for congestion and hearing loss.        Did have ear pressure when she had her vertigo  Eyes: Positive for double vision. Negative for blurred vision.  Respiratory: Negative for cough and shortness of breath.   Cardiovascular: Negative for chest pain and leg swelling.  Gastrointestinal: Negative for abdominal pain, blood in stool, constipation, diarrhea, heartburn and melena.  Genitourinary: Negative for dysuria.  Musculoskeletal: Negative for falls and joint pain.  Skin: Negative for itching and rash.  Neurological: Negative for dizziness and loss of consciousness.       Vertigo resolved  Endo/Heme/Allergies: Does not bruise/bleed easily.  Psychiatric/Behavioral: Negative for depression and memory loss. The patient is nervous/anxious. The patient does not have insomnia.     Vitals:   06/18/18 0935  BP: 120/80  Pulse: 88  Temp: 97.9 F (36.6 C)  TempSrc: Oral  SpO2: 97%  Weight: 109 lb (49.4 kg)  Height: 5\' 5"  (1.651 m)   Body mass index is 18.14 kg/m. Physical Exam Vitals signs reviewed.  Constitutional:      General: She is not in acute distress.    Appearance: Normal appearance. She is not ill-appearing or toxic-appearing.     Comments: Thin female  HENT:     Head: Normocephalic and atraumatic.     Right Ear: Tympanic membrane, ear canal and external ear normal.     Left Ear: Tympanic membrane, ear canal and external ear normal.     Ears:     Comments: Small amt cerumen left     Nose: Nose normal.     Mouth/Throat:     Mouth: Mucous membranes are dry.     Pharynx: Oropharynx is clear. No  oropharyngeal exudate.  Eyes:     Extraocular Movements: Extraocular movements intact.     Conjunctiva/sclera: Conjunctivae normal.     Pupils: Pupils are equal, round, and reactive to light.     Comments: glasses  Neck:     Musculoskeletal: Normal range of motion and neck supple. No neck rigidity.  Cardiovascular:     Rate and Rhythm: Normal rate and regular rhythm.     Comments: I did hear some pvcs/pacs, but ekg turned out NSR Pulmonary:     Effort: Pulmonary effort is normal.     Breath sounds: Normal breath sounds.  Abdominal:     General: Abdomen is flat. Bowel sounds are normal. There is no distension.     Palpations: There  is no mass.     Tenderness: There is no abdominal tenderness. There is no guarding or rebound.  Musculoskeletal: Normal range of motion.        General: No swelling or tenderness.     Right lower leg: No edema.     Left lower leg: No edema.  Lymphadenopathy:     Cervical: No cervical adenopathy.  Skin:    General: Skin is warm and dry.     Capillary Refill: Capillary refill takes less than 2 seconds.  Neurological:     General: No focal deficit present.     Mental Status: She is alert and oriented to person, place, and time. Mental status is at baseline.     Cranial Nerves: No cranial nerve deficit.     Sensory: No sensory deficit.     Motor: No weakness.     Coordination: Coordination normal.     Gait: Gait normal.     Deep Tendon Reflexes: Reflexes normal.  Psychiatric:        Mood and Affect: Mood normal.        Behavior: Behavior normal.        Thought Content: Thought content normal.        Judgment: Judgment normal.     Comments: A little anxious     Labs reviewed: Basic Metabolic Panel: Recent Labs    06/07/18 0802  NA 139  K 4.4  CL 102  CO2 32  GLUCOSE 94  BUN 12  CREATININE 0.68  CALCIUM 9.7   Liver Function Tests: Recent Labs    06/07/18 0802  AST 18  ALT 13  BILITOT 0.7  PROT 6.8   No results for input(s):  LIPASE, AMYLASE in the last 8760 hours. No results for input(s): AMMONIA in the last 8760 hours. CBC: Recent Labs    06/07/18 0802  WBC 7.1  NEUTROABS 4,899  HGB 13.1  HCT 39.3  MCV 91.4  PLT 285   Cardiac Enzymes: No results for input(s): CKTOTAL, CKMB, CKMBINDEX, TROPONINI in the last 8760 hours. BNP: Invalid input(s): POCBNP Lab Results  Component Value Date   HGBA1C 5.6 06/07/2018   Lab Results  Component Value Date   TSH 1.52 06/07/2018   Lab Results  Component Value Date   VITAMINB12 485 06/08/2017   Lab Results  Component Value Date   FOLATE 12.5 06/06/2016   Lab Results  Component Value Date   IRON 86 06/05/2015   TIBC 286 06/05/2015   FERRITIN 80 06/05/2015    Imaging and Procedures Recently: EKG was NSR today w/o pvcs, pacs, no acute ischemia or infarct.  Assessment/Plan 1. Palpitations -in mornings, ekgs have not revealed any concerning findings of afib, today's w/o pacs or pvcs even - EKG 12-Lead  2. Annual physical exam -performed today -has mammo scheduled in summertime - EKG 12-Lead - CBC with Differential/Platelet; Future - COMPLETE METABOLIC PANEL WITH GFR; Future - Hemoglobin A1c; Future - Lipid panel; Future  3. Hyperglycemia -cont to be careful with sweets but pt generally does make healthy choices and stays active Lab Results  Component Value Date   HGBA1C 5.6 06/07/2018   - CBC with Differential/Platelet; Future - COMPLETE METABOLIC PANEL WITH GFR; Future - Hemoglobin A1c; Future  4. Pure hypercholesterolemia - Lab Results  Component Value Date   CHOL 180 06/07/2018   CHOL 197 06/08/2017   CHOL 186 06/06/2016   Lab Results  Component Value Date   HDL 72 06/07/2018   HDL 62  06/08/2017   HDL 75 06/06/2016   Lab Results  Component Value Date   LDLCALC 91 06/07/2018   LDLCALC 115 (H) 06/08/2017   LDLCALC 92 06/06/2016   Lab Results  Component Value Date   TRIG 84 06/07/2018   TRIG 102 06/08/2017   TRIG 95  06/06/2016   Lab Results  Component Value Date   CHOLHDL 2.5 06/07/2018   CHOLHDL 3.2 06/08/2017   CHOLHDL 2.5 06/06/2016   No results found for: LDLDIRECT  - EKG 12-Lead - Lipid panel; Future  5. Benign paroxysmal positional vertigo of left ear -mild cerumen remains, vertigo resolved  6. Senile osteoporosis -cont prolia and vitamin D3   7. Underweight -cont to try to increase po intake to compensate for level of activity and supplement protein adequately  8. Body mass index (BMI) of 19 or less in adult -remains underweight, follows mostly vegetarian diet and is quite active, has always been thin  Labs/tests ordered:   Orders Placed This Encounter  Procedures  . CBC with Differential/Platelet    Standing Status:   Future    Standing Expiration Date:   06/18/2019  . COMPLETE METABOLIC PANEL WITH GFR    Standing Status:   Future    Standing Expiration Date:   06/18/2019  . Hemoglobin A1c    Standing Status:   Future    Standing Expiration Date:   06/18/2019  . Lipid panel    Standing Status:   Future    Standing Expiration Date:   06/18/2019  . EKG 12-Lead     Marline Morace L. Nilza Eaker, D.O. [facility] [address], [place] [address] Cell Phone (Mon-Fri 8am-5pm):  [phone] On Call:  [phone] & follow prompts after 5pm & weekends Office Phone:  [phone] Office Fax:  [phone]

## 2018-09-10 LAB — HM DEXA SCAN

## 2018-09-11 ENCOUNTER — Encounter: Payer: Self-pay | Admitting: *Deleted

## 2018-09-12 ENCOUNTER — Encounter: Payer: Self-pay | Admitting: Internal Medicine

## 2018-09-12 LAB — HM MAMMOGRAPHY

## 2018-09-13 ENCOUNTER — Encounter: Payer: Self-pay | Admitting: *Deleted

## 2018-09-14 NOTE — Progress Notes (Signed)
Dexa results already abstracted

## 2018-09-26 ENCOUNTER — Telehealth: Payer: Self-pay | Admitting: *Deleted

## 2018-09-26 NOTE — Telephone Encounter (Signed)
Per handwritten note from Dr. Mariea Clonts, "severe osteoporosis, some improvement with prolia past in 2 years, recheck in 2 years"   Spoke with patient and advised results

## 2018-10-04 ENCOUNTER — Other Ambulatory Visit: Payer: Self-pay

## 2018-10-04 ENCOUNTER — Ambulatory Visit: Payer: Medicare Other

## 2018-10-04 DIAGNOSIS — M810 Age-related osteoporosis without current pathological fracture: Secondary | ICD-10-CM

## 2018-10-04 MED ORDER — DENOSUMAB 60 MG/ML ~~LOC~~ SOSY
60.0000 mg | PREFILLED_SYRINGE | Freq: Once | SUBCUTANEOUS | Status: AC
  Administered 2018-10-04: 60 mg via SUBCUTANEOUS

## 2019-04-08 ENCOUNTER — Ambulatory Visit: Payer: Medicare PPO

## 2019-04-08 ENCOUNTER — Other Ambulatory Visit: Payer: Self-pay

## 2019-04-08 DIAGNOSIS — M810 Age-related osteoporosis without current pathological fracture: Secondary | ICD-10-CM | POA: Diagnosis not present

## 2019-04-08 MED ORDER — DENOSUMAB 60 MG/ML ~~LOC~~ SOSY
60.0000 mg | PREFILLED_SYRINGE | Freq: Once | SUBCUTANEOUS | Status: AC
  Administered 2019-04-08: 60 mg via SUBCUTANEOUS

## 2019-05-09 DIAGNOSIS — Z961 Presence of intraocular lens: Secondary | ICD-10-CM | POA: Diagnosis not present

## 2019-05-09 DIAGNOSIS — H52203 Unspecified astigmatism, bilateral: Secondary | ICD-10-CM | POA: Diagnosis not present

## 2019-05-09 DIAGNOSIS — H25812 Combined forms of age-related cataract, left eye: Secondary | ICD-10-CM | POA: Diagnosis not present

## 2019-05-09 DIAGNOSIS — H43813 Vitreous degeneration, bilateral: Secondary | ICD-10-CM | POA: Diagnosis not present

## 2019-05-18 ENCOUNTER — Ambulatory Visit: Payer: Medicare PPO

## 2019-05-31 ENCOUNTER — Ambulatory Visit: Payer: Medicare PPO | Attending: Internal Medicine

## 2019-05-31 DIAGNOSIS — Z23 Encounter for immunization: Secondary | ICD-10-CM

## 2019-05-31 NOTE — Progress Notes (Signed)
   Covid-19 Vaccination Clinic  Name:  Adriana [name]    MRN: [id number] DOB: [birth year]  05/31/2019  [name] was observed post Covid-19 immunization for 15 minutes without incidence. She was provided with Vaccine Information Sheet and instruction to access the V-Safe system.   [name] was instructed to call 911 with any severe reactions post vaccine: Marland Kitchen Difficulty breathing  . Swelling of your face and throat  . A fast heartbeat  . A bad rash all over your body  . Dizziness and weakness    Immunizations Administered    Name Date Dose VIS Date Route   Pfizer COVID-19 Vaccine 05/31/2019  8:46 AM 0.3 mL 03/15/2019 Intramuscular   Manufacturer: [place]   Lot: [id number]   [place]: [id number]

## 2019-06-17 ENCOUNTER — Ambulatory Visit (INDEPENDENT_AMBULATORY_CARE_PROVIDER_SITE_OTHER): Payer: Medicare PPO | Admitting: Family

## 2019-06-17 ENCOUNTER — Other Ambulatory Visit: Payer: Self-pay

## 2019-06-17 ENCOUNTER — Encounter: Payer: Self-pay | Admitting: Family

## 2019-06-17 DIAGNOSIS — Z0000 Encounter for general adult medical examination without abnormal findings: Secondary | ICD-10-CM | POA: Diagnosis not present

## 2019-06-17 NOTE — Progress Notes (Signed)
Subjective:   Adriana [name] is a 79 y.o. female who presents for Medicare Annual (Subsequent) preventive examination.  Review of Systems:  Cardiac Risk Factors include: advanced age (>41men, >38 women)     Objective:     Vitals: There were no vitals taken for this visit.  There is no height or weight on file to calculate BMI.  Advanced Directives 06/17/2019 06/15/2018 06/12/2017 06/12/2017 02/09/2017 11/04/2016 11/02/2016  Does Patient Have a Medical Advance Directive? Yes Yes Yes Yes Yes No No  Type of Paramedic of [place];Living will Healthcare Power of [place] of [place] - -  Does patient want to make changes to medical advance directive? No - Patient declined No - Patient declined No - Patient declined No - Patient declined No - Patient declined - -  Copy of [place] in Chart? Yes - validated most recent copy scanned in chart (See row information) Yes - validated most recent copy scanned in chart (See row information) Yes Yes Yes - -  Would patient like information on creating a medical advance directive? - - - - - - No - Patient declined    Tobacco Social History   Tobacco Use  Smoking Status Never Smoker  Smokeless Tobacco Never Used     Counseling given: Not Answered   Clinical Intake:  Pre-visit preparation completed: No  Pain : No/denies pain     BMI - recorded: 18.64 Nutritional Status: BMI <19  Underweight Nutritional Risks: None Diabetes: No  How often do you need to have someone help you when you read instructions, pamphlets, or other written materials from your doctor or pharmacy?: 1 - Never What is the last grade level you completed in school?: Masters Degree  Interpreter Needed?: No  Information entered by :: Ahnesty Finfrock FNP-C  Past Medical History:  Diagnosis Date  . Cancer (HCC)    squamous cell face removed  . Cellulitis, toe     osteomylitis  . Cholelithiasis   . Diverticulitis   . Heart murmur    mitral valve proplase   . Hiatal hernia    hx of 25 years ago  . Hyperlipidemia   . Mitral valve prolapse   . Osteoporosis   . PONV (postoperative nausea and vomiting)    Past Surgical History:  Procedure Laterality Date  . BREAST BIOPSY     Left,negative   . CHOLECYSTECTOMY     11/02/16 Dr. Harlow Asa  . CHOLECYSTECTOMY N/A 11/02/2016   Procedure: LAPAROSCOPIC CHOLECYSTECTOMY WITH INTRAOPERATIVE CHOLANGIOGRAM;  Surgeon: Armandina Gemma, MD;  Location: [facility];  Service: General;  Laterality: N/A;  . COLONOSCOPY  05/2003   No polyps, hemorrhoids,and diverticulosis   . ENDOSCOPIC RETROGRADE CHOLANGIOPANCREATOGRAPHY (ERCP) WITH PROPOFOL N/A 11/04/2016   Procedure: ENDOSCOPIC RETROGRADE CHOLANGIOPANCREATOGRAPHY (ERCP) WITH PROPOFOL;  Surgeon: [name], MD;  [facility];  Service: Gastroenterology;  Laterality: N/A;  . EYE SURGERY     r eye cataract removed   Family History  Problem Relation Age of Onset  . Pneumonia Father 33  . Cerebrovascular Accident Mother 7  . Stroke Mother   . Breast cancer Sister 52   Social History   Socioeconomic History  . Marital status: Widowed    Spouse name: Not on file  . Number of children: Not on file  . Years of education: Not on file  . Highest education level: Not on file  Occupational History  . Not  on file  Tobacco Use  . Smoking status: Never Smoker  . Smokeless tobacco: Never Used  Substance and Sexual Activity  . Alcohol use: No  . Drug use: No  . Sexual activity: Never  Other Topics Concern  . Not on file  Social History Narrative   Dr.Stoneburner, Ophthalmologist   Dr.Peterson, Urologist   Dr.Lomax, Dermatologist     Social Determinants of Health   Financial Resource Strain:   . Difficulty of Paying Living Expenses:   Food Insecurity:   . Worried About Charity fundraiser in the Last Year:   . Arboriculturist in the Last Year:     Transportation Needs:   . Film/video editor (Medical):   Marland Kitchen Lack of Transportation (Non-Medical):   Physical Activity:   . Days of Exercise per Week:   . Minutes of Exercise per Session:   Stress:   . Feeling of Stress :   Social Connections:   . Frequency of Communication with Friends and Family:   . Frequency of Social Gatherings with Friends and Family:   . Attends Religious Services:   . Active Member of Clubs or Organizations:   . Attends Archivist Meetings:   Marland Kitchen Marital Status:     Outpatient Encounter Medications as of 06/17/2019  Medication Sig  . Cholecalciferol (VITAMIN D-3) 1000 units CAPS Take 2,000 Units by mouth daily with supper.  . denosumab (PROLIA) 60 MG/ML SOLN injection Inject 60 mg into the skin every 6 (six) months. Administer in upper arm, thigh, or abdomen  . Polyethyl Glycol-Propyl Glycol (LUBRICANT EYE DROPS) 0.4-0.3 % SOLN Place 1 drop into both eyes daily as needed (for dry eyes.).  Marland Kitchen vitamin B-12 (CYANOCOBALAMIN) 1000 MCG tablet Take 1,000 mcg by mouth daily.   No facility-administered encounter medications on file as of 06/17/2019.    Activities of Daily Living In your present state of health, do you have any difficulty performing the following activities: 06/17/2019  Hearing? N  Vision? N  Difficulty concentrating or making decisions? N  Walking or climbing stairs? N  Dressing or bathing? N  Doing errands, shopping? N  Preparing Food and eating ? N  Using the Toilet? N  In the past six months, have you accidently leaked urine? Y  Comment occasiunal with sneezing  Do you have problems with loss of bowel control? N  Managing your Medications? N  Managing your Finances? N  Housekeeping or managing your Housekeeping? N  Some recent data might be hidden    Patient Care Team: Gayland Curry, DO as PCP - General (Geriatric Medicine) Shon Hough, MD as Consulting Physician (Ophthalmology) Gevena Cotton, MD as Consulting  Physician (Ophthalmology)    Assessment:   This is a routine wellness examination for Adriana [name].  Exercise Activities and Dietary recommendations Current Exercise Habits: Home exercise routine, Type of exercise: walking;strength training/weights;stretching, Time (Minutes): 30, Frequency (Times/Week): 3, Weekly Exercise (Minutes/Week): 90, Intensity: Moderate, Exercise limited by: None identified  Goals    . <enter goal here>     Starting 06/06/16, I will maintain my current lifestyle.        Fall Risk Fall Risk  06/17/2019 06/18/2018 06/15/2018 05/14/2018 12/11/2017  Falls in the past year? 0 0 0 0 No  Number falls in past yr: 0 0 0 0 -  Injury with Fall? 0 0 0 0 -  Risk for fall due to : - - - Impaired balance/gait -  Follow up - - - Falls  prevention discussed;Education provided;Falls evaluation completed -   Is the patient's home free of loose throw rugs in walkways, pet beds, electrical cords, etc?   yes      Grab bars in the bathroom? yes      Handrails on the stairs?   yes      Adequate lighting?   yes  Depression Screen PHQ 2/9 Scores 06/17/2019 06/18/2018 06/15/2018 05/14/2018  PHQ - 2 Score 0 0 0 0  Exception Documentation - - - -  Not completed - - - -     Cognitive Function MMSE - Mini Mental State Exam 06/15/2018 06/12/2017 06/06/2016 06/08/2015  Orientation to time 5 5 5 5   Orientation to Place 5 5 5 5   Registration 3 3 3 3   Attention/ Calculation 5 5 5 5   Recall 3 3 3 3   Language- name 2 objects 2 2 2 2   Language- repeat 1 1 1 1   Language- follow 3 step command 3 3 3 3   Language- read & follow direction 1 1 1 1   Write a sentence 1 1 1 1   Copy design 1 1 1 1   Total score 30 30 30 30      6CIT Screen 06/17/2019  What Year? 0 points  What month? 0 points  What time? 0 points  Count back from 20 0 points  Months in reverse 0 points  Repeat phrase 0 points  Total Score 0    Immunization History  Administered Date(s) Administered  . Influenza Split 01/30/2012  .  Influenza, High Dose Seasonal PF 02/09/2017, 12/11/2017, 01/02/2019  . Influenza, Seasonal, Injecte, Preservative Fre 01/03/2015  . Influenza,inj,Quad PF,6+ Mos 01/08/2013, 12/08/2015  . Influenza-Unspecified 01/03/2014  . PFIZER SARS-COV-2 Vaccination 05/31/2019  . Pneumococcal Conjugate-13 06/06/2014  . Pneumococcal Polysaccharide-23 06/02/2005  . Td 04/04/2002  . Tdap 12/14/2013  . Zoster 04/04/2009    Qualifies for Shingles Vaccine? Will get vaccine   Screening Tests Health Maintenance  Topic Date Due  . MAMMOGRAM  09/12/2019  . TETANUS/TDAP  12/15/2023  . INFLUENZA VACCINE  Completed  . DEXA SCAN  Completed  . PNA vac Low Risk Adult  Completed    Cancer Screenings: Lung: Low Dose CT Chest recommended if Age 49-80 years, 30 pack-year currently smoking OR have quit w/in 15years. Patient does not qualify. Breast:  Up to date on Mammogram? Yes   Up to date of Bone Density/Dexa? Yes Colorectal: Up to date   Additional Screenings: Hepatitis C Screening: Low Risk      Plan:   - Will get Shingrix vaccine at the pharmacy once she completes her COVID-19 vaccine   I have personally reviewed and noted the following in the patient's chart:   . Medical and social history . Use of alcohol, tobacco or illicit drugs  . Current medications and supplements . Functional ability and status . Nutritional status . Physical activity . Advanced directives . List of other physicians . Hospitalizations, surgeries, and ER visits in previous 12 months . Vitals . Screenings to include cognitive, depression, and falls . Referrals and appointments  In addition, I have reviewed and discussed with patient certain preventive protocols, quality metrics, and best practice recommendations. A written personalized care plan for preventive services as well as general preventive health recommendations were provided to patient.  Sandrea Hughs, NP  06/17/2019

## 2019-06-17 NOTE — Progress Notes (Signed)
Patient ID: Adriana [name], female   DOB: [birth year], 78 y.o.   MRN: [id number] This service is provided via telemedicine  No vital signs collected/recorded due to the encounter was a telemedicine visit.   Location of patient (ex: home, work):  HOME  Patient consents to a telephone visit:  YES   Location of the provider (ex: office, home):  OFFICE  Name of any referring provider:  TIFFANY REED, DO  Names of all persons participating in the telemedicine service and their role in the encounter:  PATIENT, Edwin Dada, [place], [place], NP  Time spent on call:  4:08

## 2019-06-17 NOTE — Patient Instructions (Signed)
Adriana [name] , Thank you for taking time to come for your Medicare Wellness Visit. I appreciate your ongoing commitment to your health goals. Please review the following plan we discussed and let me know if I can assist you in the future.   Screening recommendations/referrals: Colonoscopy: Up to date  Mammogram : Up to date due 09/12/2019  Bone Density : Up to date  Recommended yearly ophthalmology/optometry visit for glaucoma screening and checkup Recommended yearly dental visit for hygiene and checkup  Vaccinations: Influenza vaccine : Up to date  Pneumococcal vaccine: Up to date  Tdap vaccine : Up to date due 12/15/2023  Shingles vaccine: Please get shingrix vaccine at your pharmacy 2-4 weeks after you complete your COVID-19 vaccine.    Advanced directives: Yes   Conditions/risks identified: Advance age female > 90 yrs   Next appointment: 1 year    Preventive Care 61 Years and Older, Female Preventive care refers to lifestyle choices and visits with your health care provider that can promote health and wellness. What does preventive care include?  A yearly physical exam. This is also called an annual well check.  Dental exams once or twice a year.  Routine eye exams. Ask your health care provider how often you should have your eyes checked.  Personal lifestyle choices, including:  Daily care of your teeth and gums.  Regular physical activity.  Eating a healthy diet.  Avoiding tobacco and drug use.  Limiting alcohol use.  Practicing safe sex.  Taking low-dose aspirin every day.  Taking vitamin and mineral supplements as recommended by your health care provider. What happens during an annual well check? The services and screenings done by your health care provider during your annual well check will depend on your age, overall health, lifestyle risk factors, and family history of disease. Counseling  Your health care provider may ask you questions about  your:  Alcohol use.  Tobacco use.  Drug use.  Emotional well-being.  Home and relationship well-being.  Sexual activity.  Eating habits.  History of falls.  Memory and ability to understand (cognition).  Work and work Statistician.  Reproductive health. Screening  You may have the following tests or measurements:  Height, weight, and BMI.  Blood pressure.  Lipid and cholesterol levels. These may be checked every 5 years, or more frequently if you are over 65 years old.  Skin check.  Lung cancer screening. You may have this screening every year starting at age 40 if you have a 30-pack-year history of smoking and currently smoke or have quit within the past 15 years.  Fecal occult blood test (FOBT) of the stool. You may have this test every year starting at age 43.  Flexible sigmoidoscopy or colonoscopy. You may have a sigmoidoscopy every 5 years or a colonoscopy every 10 years starting at age 76.  Hepatitis C blood test.  Hepatitis B blood test.  Sexually transmitted disease (STD) testing.  Diabetes screening. This is done by checking your blood sugar (glucose) after you have not eaten for a while (fasting). You may have this done every 1-3 years.  Bone density scan. This is done to screen for osteoporosis. You may have this done starting at age 74.  Mammogram. This may be done every 1-2 years. Talk to your health care provider about how often you should have regular mammograms. Talk with your health care provider about your test results, treatment options, and if necessary, the need for more tests. Vaccines  Your health care provider  may recommend certain vaccines, such as:  Influenza vaccine. This is recommended every year.  Tetanus, diphtheria, and acellular pertussis (Tdap, Td) vaccine. You may need a Td booster every 10 years.  Zoster vaccine. You may need this after age 73.  Pneumococcal 13-valent conjugate (PCV13) vaccine. One dose is recommended  after age 78.  Pneumococcal polysaccharide (PPSV23) vaccine. One dose is recommended after age 71. Talk to your health care provider about which screenings and vaccines you need and how often you need them. This information is not intended to replace advice given to you by your health care provider. Make sure you discuss any questions you have with your health care provider. Document Released: 04/17/2015 Document Revised: 12/09/2015 Document Reviewed: 01/20/2015 Elsevier Interactive Patient Education  2017 [place] Prevention in the Home Falls can cause injuries. They can happen to people of all ages. There are many things you can do to make your home safe and to help prevent falls. What can I do on the outside of my home?  Regularly fix the edges of walkways and driveways and fix any cracks.  Remove anything that might make you trip as you walk through a door, such as a raised step or threshold.  Trim any bushes or trees on the path to your home.  Use bright outdoor lighting.  Clear any walking paths of anything that might make someone trip, such as rocks or tools.  Regularly check to see if handrails are loose or broken. Make sure that both sides of any steps have handrails.  Any raised decks and porches should have guardrails on the edges.  Have any leaves, snow, or ice cleared regularly.  Use sand or salt on walking paths during winter.  Clean up any spills in your garage right away. This includes oil or grease spills. What can I do in the bathroom?  Use night lights.  Install grab bars by the toilet and in the tub and shower. Do not use towel bars as grab bars.  Use non-skid mats or decals in the tub or shower.  If you need to sit down in the shower, use a plastic, non-slip stool.  Keep the floor dry. Clean up any water that spills on the floor as soon as it happens.  Remove soap buildup in the tub or shower regularly.  Attach bath mats securely with  double-sided non-slip rug tape.  Do not have throw rugs and other things on the floor that can make you trip. What can I do in the bedroom?  Use night lights.  Make sure that you have a light by your bed that is easy to reach.  Do not use any sheets or blankets that are too big for your bed. They should not hang down onto the floor.  Have a firm chair that has side arms. You can use this for support while you get dressed.  Do not have throw rugs and other things on the floor that can make you trip. What can I do in the kitchen?  Clean up any spills right away.  Avoid walking on wet floors.  Keep items that you use a lot in easy-to-reach places.  If you need to reach something above you, use a strong step stool that has a grab bar.  Keep electrical cords out of the way.  Do not use floor polish or wax that makes floors slippery. If you must use wax, use non-skid floor wax.  Do not have throw rugs  and other things on the floor that can make you trip. What can I do with my stairs?  Do not leave any items on the stairs.  Make sure that there are handrails on both sides of the stairs and use them. Fix handrails that are broken or loose. Make sure that handrails are as long as the stairways.  Check any carpeting to make sure that it is firmly attached to the stairs. Fix any carpet that is loose or worn.  Avoid having throw rugs at the top or bottom of the stairs. If you do have throw rugs, attach them to the floor with carpet tape.  Make sure that you have a light switch at the top of the stairs and the bottom of the stairs. If you do not have them, ask someone to add them for you. What else can I do to help prevent falls?  Wear shoes that:  Do not have high heels.  Have rubber bottoms.  Are comfortable and fit you well.  Are closed at the toe. Do not wear sandals.  If you use a stepladder:  Make sure that it is fully opened. Do not climb a closed stepladder.  Make  sure that both sides of the stepladder are locked into place.  Ask someone to hold it for you, if possible.  Clearly mark and make sure that you can see:  Any grab bars or handrails.  First and last steps.  Where the edge of each step is.  Use tools that help you move around (mobility aids) if they are needed. These include:  Canes.  Walkers.  Scooters.  Crutches.  Turn on the lights when you go into a dark area. Replace any light bulbs as soon as they burn out.  Set up your furniture so you have a clear path. Avoid moving your furniture around.  If any of your floors are uneven, fix them.  If there are any pets around you, be aware of where they are.  Review your medicines with your doctor. Some medicines can make you feel dizzy. This can increase your chance of falling. Ask your doctor what other things that you can do to help prevent falls. This information is not intended to replace advice given to you by your health care provider. Make sure you discuss any questions you have with your health care provider. Document Released: 01/15/2009 Document Revised: 08/27/2015 Document Reviewed: 04/25/2014 Elsevier Interactive Patient Education  2017 [employer].

## 2019-06-18 ENCOUNTER — Other Ambulatory Visit: Payer: Medicare PPO

## 2019-06-18 ENCOUNTER — Other Ambulatory Visit: Payer: Self-pay

## 2019-06-18 DIAGNOSIS — E7800 Pure hypercholesterolemia, unspecified: Secondary | ICD-10-CM | POA: Diagnosis not present

## 2019-06-18 DIAGNOSIS — R739 Hyperglycemia, unspecified: Secondary | ICD-10-CM | POA: Diagnosis not present

## 2019-06-18 DIAGNOSIS — Z0000 Encounter for general adult medical examination without abnormal findings: Secondary | ICD-10-CM | POA: Diagnosis not present

## 2019-06-19 LAB — CBC WITH DIFFERENTIAL/PLATELET
Absolute Monocytes: 548 cells/uL (ref 200–950)
Basophils Absolute: 61 cells/uL (ref 0–200)
Basophils Relative: 0.7 %
Eosinophils Absolute: 174 cells/uL (ref 15–500)
Eosinophils Relative: 2 %
HCT: 40.7 % (ref 35.0–45.0)
Hemoglobin: 13.4 g/dL (ref 11.7–15.5)
Lymphs Abs: 1740 cells/uL (ref 850–3900)
MCH: 30.4 pg (ref 27.0–33.0)
MCHC: 32.9 g/dL (ref 32.0–36.0)
MCV: 92.3 fL (ref 80.0–100.0)
MPV: 10.5 fL (ref 7.5–12.5)
Monocytes Relative: 6.3 %
Neutro Abs: 6177 cells/uL (ref 1500–7800)
Neutrophils Relative %: 71 %
Platelets: 282 10*3/uL (ref 140–400)
RBC: 4.41 10*6/uL (ref 3.80–5.10)
RDW: 12.5 % (ref 11.0–15.0)
Total Lymphocyte: 20 %
WBC: 8.7 10*3/uL (ref 3.8–10.8)

## 2019-06-19 LAB — COMPLETE METABOLIC PANEL WITH GFR
AG Ratio: 1.7 (calc) (ref 1.0–2.5)
ALT: 13 U/L (ref 6–29)
AST: 18 U/L (ref 10–35)
Albumin: 4.3 g/dL (ref 3.6–5.1)
Alkaline phosphatase (APISO): 52 U/L (ref 37–153)
BUN: 18 mg/dL (ref 7–25)
CO2: 25 mmol/L (ref 20–32)
Calcium: 9.3 mg/dL (ref 8.6–10.4)
Chloride: 101 mmol/L (ref 98–110)
Creat: 0.73 mg/dL (ref 0.60–0.93)
GFR, Est African American: 91 mL/min/{1.73_m2} (ref >=60)
GFR, Est Non African American: 78 mL/min/{1.73_m2} (ref >=60)
Globulin: 2.5 g/dL (calc) (ref 1.9–3.7)
Glucose, Bld: 101 mg/dL — ABNORMAL HIGH (ref 65–99)
Potassium: 4.4 mmol/L (ref 3.5–5.3)
Sodium: 138 mmol/L (ref 135–146)
Total Bilirubin: 0.6 mg/dL (ref 0.2–1.2)
Total Protein: 6.8 g/dL (ref 6.1–8.1)

## 2019-06-19 LAB — HEMOGLOBIN A1C
Hgb A1c MFr Bld: 5.7 % of total Hgb — ABNORMAL HIGH (ref <5.7)
Mean Plasma Glucose: 117 (calc)
eAG (mmol/L): 6.5 (calc)

## 2019-06-19 LAB — LIPID PANEL
Cholesterol: 178 mg/dL (ref <200)
HDL: 73 mg/dL (ref >=50)
LDL Cholesterol (Calc): 89 mg/dL (calc)
Non-HDL Cholesterol (Calc): 105 mg/dL (calc) (ref <130)
Total CHOL/HDL Ratio: 2.4 (calc) (ref <5.0)
Triglycerides: 71 mg/dL (ref <150)

## 2019-06-19 NOTE — Progress Notes (Signed)
Labs generally stable.  We can discuss at her visit.

## 2019-06-24 ENCOUNTER — Encounter: Payer: Self-pay | Admitting: Internal Medicine

## 2019-06-24 ENCOUNTER — Ambulatory Visit (INDEPENDENT_AMBULATORY_CARE_PROVIDER_SITE_OTHER): Payer: Medicare PPO | Admitting: Internal Medicine

## 2019-06-24 ENCOUNTER — Other Ambulatory Visit: Payer: Self-pay

## 2019-06-24 VITALS — BP 122/78 | HR 82 | Temp 97.5°F | Ht 65.0 in | Wt 113.0 lb

## 2019-06-24 DIAGNOSIS — R636 Underweight: Secondary | ICD-10-CM | POA: Diagnosis not present

## 2019-06-24 DIAGNOSIS — R739 Hyperglycemia, unspecified: Secondary | ICD-10-CM | POA: Diagnosis not present

## 2019-06-24 DIAGNOSIS — E7800 Pure hypercholesterolemia, unspecified: Secondary | ICD-10-CM | POA: Diagnosis not present

## 2019-06-24 DIAGNOSIS — M810 Age-related osteoporosis without current pathological fracture: Secondary | ICD-10-CM | POA: Diagnosis not present

## 2019-06-24 DIAGNOSIS — Z0000 Encounter for general adult medical examination without abnormal findings: Secondary | ICD-10-CM | POA: Diagnosis not present

## 2019-06-24 DIAGNOSIS — R109 Unspecified abdominal pain: Secondary | ICD-10-CM

## 2019-06-24 DIAGNOSIS — R140 Abdominal distension (gaseous): Secondary | ICD-10-CM

## 2019-06-24 NOTE — Progress Notes (Signed)
Location:  [facility]  Provider: Dr. Hollace Kinnier  Goals of Care:  Advanced Directives 06/17/2019  Does Patient Have a Medical Advance Directive? Yes  Type of Paramedic of [place];Living will  Does patient want to make changes to medical advance directive? No - Patient declined  Copy of [place] in Chart? Yes - validated most recent copy scanned in chart (See row information)  Would patient like information on creating a medical advance directive? -     No chief complaint on file.   HPI: Patient is a 79 y.o. female seen today for annual physical exam.   She has been doing well. She is excited about spring and taking walks wither her dog and gardening.   Labs reviewed with patient.  Second covid vaccine is scheduled for tomorrow. No reactions with with first vaccine.   She has been trying to gain weight. Still following a vegetarian diet, but will eat salmon and tuna. Her neighbor likes to bake and share. She is trying to to limit her sugar intake to less than 25 grams a day. Also drinking Premier Protein a few times weekly.   She is a member at [employer], but has not gone because of covid. She uses 4lb weights, walks regularly, and does yard work to build bone strength.  No recent falls or injuries.   Not interested in having mammogram in June 2021. Her sister passed at the age of 35. She states if she was diagnosed with breast cancer, she would not seek treatment. She will check her breasts regularly.   She has been having irritable bowel issues for for the past few weeks. This past Saturday she was eating fruit, yogurt and walnuts, later on she had pain and bloating, no diarrhea. She was able to eat vegetable soup and tolerated her meal after incident.   Also experiencing cramping toes in the evening. Believes this ocurrs when she does not exercise. Walking will help the cramping subside.   She plans on having left cataract  removed this summer.         Past Medical History:  Diagnosis Date  . Cancer (HCC)    squamous cell face removed  . Cellulitis, toe    osteomylitis  . Cholelithiasis   . Diverticulitis   . Heart murmur    mitral valve proplase   . Hiatal hernia    hx of 25 years ago  . Hyperlipidemia   . Mitral valve prolapse   . Osteoporosis   . PONV (postoperative nausea and vomiting)     Past Surgical History:  Procedure Laterality Date  . BREAST BIOPSY     Left,negative   . CHOLECYSTECTOMY     11/02/16 Dr. Harlow Asa  . CHOLECYSTECTOMY N/A 11/02/2016   Procedure: LAPAROSCOPIC CHOLECYSTECTOMY WITH INTRAOPERATIVE CHOLANGIOGRAM;  Surgeon: Armandina Gemma, MD;  Location: [facility];  Service: General;  Laterality: N/A;  . COLONOSCOPY  05/2003   No polyps, hemorrhoids,and diverticulosis   . ENDOSCOPIC RETROGRADE CHOLANGIOPANCREATOGRAPHY (ERCP) WITH PROPOFOL N/A 11/04/2016   Procedure: ENDOSCOPIC RETROGRADE CHOLANGIOPANCREATOGRAPHY (ERCP) WITH PROPOFOL;  Surgeon: [name], MD;  Location: [facility];  Service: Gastroenterology;  Laterality: N/A;  . EYE SURGERY     r eye cataract removed    Allergies  Allergen Reactions  . Chlorhexidine Rash    Severe rash after her cholecystectomy and ERCP  . Adhesive [Tape] Rash and Other (See Comments)    Skin irritation with extended exposure.  . Latex Rash  and Other (See Comments)    Blistering with extended exposure    Outpatient Encounter Medications as of 06/24/2019  Medication Sig  . Cholecalciferol (VITAMIN D-3) 1000 units CAPS Take 2,000 Units by mouth daily with supper.  . denosumab (PROLIA) 60 MG/ML SOLN injection Inject 60 mg into the skin every 6 (six) months. Administer in upper arm, thigh, or abdomen  . Polyethyl Glycol-Propyl Glycol (LUBRICANT EYE DROPS) 0.4-0.3 % SOLN Place 1 drop into both eyes daily as needed (for dry eyes.).  Marland Kitchen vitamin B-12 (CYANOCOBALAMIN) 1000 MCG tablet Take 1,000 mcg by mouth daily.   No facility-administered  encounter medications on file as of 06/24/2019.    Review of Systems:  Review of Systems  Constitutional: Negative for activity change, appetite change and fatigue.  HENT: Negative for dental problem, hearing loss and trouble swallowing.   Eyes: Negative for photophobia and visual disturbance.       Left cataract  Respiratory: Negative for cough and shortness of breath.   Cardiovascular: Negative for chest pain and leg swelling.  Gastrointestinal: Positive for abdominal pain. Negative for constipation and diarrhea.       Bloating  Endocrine: Negative for polydipsia, polyphagia and polyuria.  Genitourinary: Negative for dysuria, frequency, hematuria and vaginal bleeding.  Musculoskeletal: Negative.   Skin:       Dry skin  Neurological: Negative for dizziness, weakness, light-headedness and headaches.  Psychiatric/Behavioral: Negative for dysphoric mood and sleep disturbance. The patient is not nervous/anxious.     Health Maintenance  Topic Date Due  . MAMMOGRAM  09/12/2019  . TETANUS/TDAP  12/15/2023  . INFLUENZA VACCINE  Completed  . DEXA SCAN  Completed  . PNA vac Low Risk Adult  Completed    Physical Exam: There were no vitals filed for this visit. There is no height or weight on file to calculate BMI. Physical Exam Vitals reviewed.  Constitutional:      Appearance: Normal appearance. She is normal weight.  HENT:     Head: Normocephalic.     Right Ear: There is no impacted cerumen.     Left Ear: There is no impacted cerumen.  Eyes:     Extraocular Movements: Extraocular movements intact.     Pupils: Pupils are equal, round, and reactive to light.  Neck:     Thyroid: No thyroid mass, thyromegaly or thyroid tenderness.  Cardiovascular:     Rate and Rhythm: Normal rate and regular rhythm.     Pulses: Normal pulses.     Heart sounds: Normal heart sounds. No murmur.  Pulmonary:     Effort: Respiratory distress present.     Breath sounds: Normal breath sounds.   Abdominal:     General: Abdomen is flat. Bowel sounds are normal. There is no distension.     Palpations: Abdomen is soft. There is no mass.     Tenderness: There is no abdominal tenderness.  Musculoskeletal:        General: No swelling. Normal range of motion.  Skin:    General: Skin is warm and dry.     Capillary Refill: Capillary refill takes less than 2 seconds.  Neurological:     General: No focal deficit present.     Mental Status: She is alert and oriented to person, place, and time. Mental status is at baseline.  Psychiatric:        Mood and Affect: Mood normal.        Behavior: Behavior normal.        Thought  Content: Thought content normal.        Judgment: Judgment normal.     Labs reviewed: Basic Metabolic Panel: Recent Labs    06/18/19 0822  NA 138  K 4.4  CL 101  CO2 25  GLUCOSE 101*  BUN 18  CREATININE 0.73  CALCIUM 9.3   Liver Function Tests: Recent Labs    06/18/19 0822  AST 18  ALT 13  BILITOT 0.6  PROT 6.8   No results for input(s): LIPASE, AMYLASE in the last 8760 hours. No results for input(s): AMMONIA in the last 8760 hours. CBC: Recent Labs    06/18/19 0822  WBC 8.7  NEUTROABS 6,177  HGB 13.4  HCT 40.7  MCV 92.3  PLT 282   Lipid Panel: Recent Labs    06/18/19 0822  CHOL 178  HDL 73  LDLCALC 89  TRIG 71  CHOLHDL 2.4   Lab Results  Component Value Date   HGBA1C 5.7 (H) 06/18/2019    Procedures since last visit: No results found.  Assessment/Plan 1. Annual physical exam - performed today - declines mammogram at this time - next bone density 09/2020 - cbc with differential/platelets- future - complete metabolic panel with GFR- future - hemoglobin A1C- future - lipid panel- future - TSH- future - Iron, TIBC, ferritin panel- future  2. Senile osteoporosis - stable at this time, high risk for fracture - continue Prolia and vitamin D - continue weight bearing exercise - next bone density 09/2020   3.  Abdominal bloating with cramps - suspect indigestion, lactose intolerance or gallbladder intolerance - recommend low fat diet - recommend food diary to isolate trigger foods  4. Hyperglycemia - hemoglobin A1C slightly elevated to 5.7 - she has admitted to increased sweets in her diet - recommend limiting sugar and carbs from diet - hemoglobin A1C- future  5. Pure hypercholesterolemia - HDL<100, total <200 - continue to follow low fate diet - lipid panel- future  6. Underweight - stable - underweight due to vegetarian diet - continue to add calories - continue to try protein drinks with low sugar    Labs/tests ordered:  CBC with differential/platelets, complete metabolic panel with GFR, lipid panel, hemoglobin A1C, TSH, Iron, TIBC, ferritin panel- future Next appt:  10/08/2019

## 2019-06-25 ENCOUNTER — Ambulatory Visit: Payer: Medicare PPO | Attending: Internal Medicine

## 2019-06-25 DIAGNOSIS — Z23 Encounter for immunization: Secondary | ICD-10-CM

## 2019-06-25 NOTE — Progress Notes (Signed)
   Covid-19 Vaccination Clinic  Name:  Adriana [name]    MRN: [id number] DOB: May 02, 1940  06/25/2019  Adriana [name] was observed post Covid-19 immunization for 15 minutes without incident. She was provided with Vaccine Information Sheet and instruction to access the V-Safe system.   Adriana [name] was instructed to call 911 with any severe reactions post vaccine: Marland Kitchen Difficulty breathing  . Swelling of face and throat  . A fast heartbeat  . A bad rash all over body  . Dizziness and weakness   Immunizations Administered    Name Date Dose VIS Date Route   Pfizer COVID-19 Vaccine 06/25/2019 10:08 AM 0.3 mL 03/15/2019 Intramuscular   Manufacturer: [place]   Lot: [id number]   [place]: [id number]

## 2019-07-23 DIAGNOSIS — Z8249 Family history of ischemic heart disease and other diseases of the circulatory system: Secondary | ICD-10-CM | POA: Diagnosis not present

## 2019-07-23 DIAGNOSIS — Z823 Family history of stroke: Secondary | ICD-10-CM | POA: Diagnosis not present

## 2019-07-23 DIAGNOSIS — Z79899 Other long term (current) drug therapy: Secondary | ICD-10-CM | POA: Diagnosis not present

## 2019-07-23 DIAGNOSIS — Z803 Family history of malignant neoplasm of breast: Secondary | ICD-10-CM | POA: Diagnosis not present

## 2019-07-23 DIAGNOSIS — R636 Underweight: Secondary | ICD-10-CM | POA: Diagnosis not present

## 2019-07-23 DIAGNOSIS — M810 Age-related osteoporosis without current pathological fracture: Secondary | ICD-10-CM | POA: Diagnosis not present

## 2019-07-23 DIAGNOSIS — Z681 Body mass index (BMI) 19 or less, adult: Secondary | ICD-10-CM | POA: Diagnosis not present

## 2019-09-30 ENCOUNTER — Encounter: Payer: Self-pay | Admitting: Internal Medicine

## 2019-10-01 ENCOUNTER — Other Ambulatory Visit: Payer: Self-pay

## 2019-10-01 ENCOUNTER — Encounter: Payer: Self-pay | Admitting: Family

## 2019-10-01 ENCOUNTER — Ambulatory Visit (INDEPENDENT_AMBULATORY_CARE_PROVIDER_SITE_OTHER): Payer: Medicare PPO | Admitting: Family

## 2019-10-01 VITALS — BP 118/80 | HR 98 | Temp 97.7°F | Resp 16 | Ht 65.0 in | Wt 106.4 lb

## 2019-10-01 DIAGNOSIS — R058 Other specified cough: Secondary | ICD-10-CM

## 2019-10-01 DIAGNOSIS — J029 Acute pharyngitis, unspecified: Secondary | ICD-10-CM

## 2019-10-01 DIAGNOSIS — R05 Cough: Secondary | ICD-10-CM

## 2019-10-01 MED ORDER — GUAIFENESIN-DM 100-10 MG/5ML PO SYRP: 5.0000 mL | ORAL_SOLUTION | Freq: Four times a day (QID) | ORAL | 0 refills | Status: DC | PRN

## 2019-10-01 MED ORDER — SACCHAROMYCES BOULARDII 250 MG PO CAPS: 250.0000 mg | ORAL_CAPSULE | Freq: Two times a day (BID) | ORAL | 0 refills | Status: DC

## 2019-10-01 MED ORDER — AMOXICILLIN-POT CLAVULANATE 875-125 MG PO TABS: 1.0000 | ORAL_TABLET | Freq: Two times a day (BID) | ORAL | 0 refills | Status: DC

## 2019-10-06 NOTE — Progress Notes (Signed)
Provider: Durk Carmen FNP-C  Gayland Curry, DO  Patient Care Team: Gayland Curry, DO as PCP - General (Geriatric Medicine) Shon Hough, MD as Consulting Physician (Ophthalmology) Gevena Cotton, MD as Consulting Physician (Ophthalmology)  Extended Emergency Contact Information Primary Emergency Contact: [facility] Address: [address]          [place], [place] [address] Johnnette Litter of [place] Phone: [phone] Relation: Son Secondary Emergency Contact: [facility] Address: [address]          [place],  [address] Johnnette Litter of [place] Phone: [phone] Relation: Son  Code Status:  Full Code  Goals of care: Advanced Directive information Advanced Directives 10/01/2019  Does Patient Have a Medical Advance Directive? Yes  Type of Advance Directive Living will;Healthcare Power of Attorney  Does patient want to make changes to medical advance directive? No - Patient declined  Copy of [place] in Chart? Yes - validated most recent copy scanned in chart (See row information)  Would patient like information on creating a medical advance directive? -     Chief Complaint  Patient presents with  . Acute Visit    Dry Cough    HPI:  Pt is a 79 y.o. female seen today for an acute visit for evaluation of dry cough x 11 days.she describes cough as dry non-productive that keeps her awake at night.Has taken Robitussin without any help.No runny nose Has hx of allergies only spring but not like this.Has no fever,chills and no shortness of breath.Has been working in her yard work.she has had no issues with acid reflux.she stopped eating meat and went vegetarian which resolved her acid reflux.   Past Medical History:  Diagnosis Date  . Cancer (HCC)    squamous cell face removed  . Cellulitis, toe    osteomylitis  . Cholelithiasis   . Diverticulitis   . Heart murmur     mitral valve proplase   . Hiatal hernia    hx of 25 years ago  . Hyperlipidemia   . Mitral valve prolapse   . Osteoporosis   . PONV (postoperative nausea and vomiting)    Past Surgical History:  Procedure Laterality Date  . BREAST BIOPSY     Left,negative   . CHOLECYSTECTOMY     11/02/16 Dr. Harlow Asa  . CHOLECYSTECTOMY N/A 11/02/2016   Procedure: LAPAROSCOPIC CHOLECYSTECTOMY WITH INTRAOPERATIVE CHOLANGIOGRAM;  Surgeon: Armandina Gemma, MD;  Location: [facility];  Service: General;  Laterality: N/A;  . COLONOSCOPY  05/2003   No polyps, hemorrhoids,and diverticulosis   . ENDOSCOPIC RETROGRADE CHOLANGIOPANCREATOGRAPHY (ERCP) WITH PROPOFOL N/A 11/04/2016   Procedure: ENDOSCOPIC RETROGRADE CHOLANGIOPANCREATOGRAPHY (ERCP) WITH PROPOFOL;  Surgeon: [name], MD;  Location: [facility];  Service: Gastroenterology;  Laterality: N/A;  . EYE SURGERY     r eye cataract removed    Allergies  Allergen Reactions  . Chlorhexidine Rash    Severe rash after her cholecystectomy and ERCP  . Adhesive [Tape] Rash and Other (See Comments)    Skin irritation with extended exposure.  . Latex Rash and Other (See Comments)    Blistering with extended exposure    Outpatient Encounter  Medications as of 10/01/2019  Medication Sig  . Cholecalciferol (VITAMIN D-3) 1000 units CAPS Take 2,000 Units by mouth daily with supper.  . denosumab (PROLIA) 60 MG/ML SOLN injection Inject 60 mg into the skin every 6 (six) months. Administer in upper arm, thigh, or abdomen  . Polyethyl Glycol-Propyl Glycol (LUBRICANT EYE DROPS) 0.4-0.3 % SOLN Place 1 drop into both eyes daily as needed (for dry eyes.).  Marland Kitchen vitamin B-12 (CYANOCOBALAMIN) 1000 MCG tablet Take 1,000 mcg by mouth daily.  Marland Kitchen amoxicillin-clavulanate (AUGMENTIN) 875-125 MG tablet Take 1 tablet by mouth 2 (two) times daily.  Marland Kitchen guaiFENesin-dextromethorphan (ROBITUSSIN DM) 100-10 MG/5ML syrup Take 5 mLs by mouth every 6 (six) hours as needed for cough.  . saccharomyces  boulardii (FLORASTOR) 250 MG capsule Take 1 capsule (250 mg total) by mouth 2 (two) times daily.   No facility-administered encounter medications on file as of 10/01/2019.    Review of Systems  Constitutional: Negative for appetite change, chills, fatigue and fever.  HENT: Negative for congestion, rhinorrhea, sinus pressure, sinus pain, sneezing and sore throat.   Eyes: Positive for visual disturbance. Negative for discharge, redness and itching.       Wears eye glasses   Respiratory: Positive for cough. Negative for chest tightness, shortness of breath and wheezing.   Cardiovascular: Negative for chest pain, palpitations and leg swelling.  Gastrointestinal: Negative for abdominal distention, abdominal pain, constipation, diarrhea, nausea and vomiting.  Skin: Negative for color change, pallor and rash.  Neurological: Negative for dizziness, speech difficulty, weakness, light-headedness and headaches.  Psychiatric/Behavioral: Negative for agitation and sleep disturbance. The patient is not nervous/anxious.    Immunization History  Administered Date(s) Administered  . Influenza Split 01/30/2012  . Influenza, High Dose Seasonal PF 02/09/2017, 12/11/2017, 01/02/2019  . Influenza, Seasonal, Injecte, Preservative Fre 01/03/2015  . Influenza,inj,Quad PF,6+ Mos 01/08/2013, 12/08/2015  . Influenza-Unspecified 01/03/2014  . PFIZER SARS-COV-2 Vaccination 05/31/2019, 06/25/2019  . Pneumococcal Conjugate-13 06/06/2014  . Pneumococcal Polysaccharide-23 06/02/2005  . Td 04/04/2002  . Tdap 12/14/2013  . Zoster 04/04/2009   Pertinent  Health Maintenance Due  Topic Date Due  . INFLUENZA VACCINE  11/03/2019  . DEXA SCAN  09/09/2020  . PNA vac Low Risk Adult  Completed   Fall Risk  10/01/2019 06/24/2019 06/17/2019 06/18/2018 06/15/2018  Falls in the past year? 0 0 0 0 0  Number falls in past yr: 0 0 0 0 0  Injury with Fall? 0 0 0 0 0  Risk for fall due to : - - - - -  Follow up - - - - -     Vitals:   10/01/19 0929  BP: 118/80  Pulse: 98  Resp: 16  Temp: 97.7 F (36.5 C)  SpO2: 96%  Weight: 106 lb 6.4 oz (48.3 kg)  Height: 5\' 5"  (1.651 m)   Body mass index is 17.71 kg/m. Physical Exam Vitals reviewed.  Constitutional:      General: She is not in acute distress.    Appearance: She is underweight. She is not ill-appearing.  HENT:     Head: Normocephalic.     Right Ear: Tympanic membrane, ear canal and external ear normal. There is no impacted cerumen.     Left Ear: Tympanic membrane, ear canal and external ear normal. There is no impacted cerumen.     Nose: Nose normal. No congestion or rhinorrhea.     Mouth/Throat:     Mouth: Mucous membranes are moist.     Pharynx: Oropharyngeal exudate and  posterior oropharyngeal erythema present.  Eyes:     General: No scleral icterus.       Right eye: No discharge.        Left eye: No discharge.     Extraocular Movements: Extraocular movements intact.     Conjunctiva/sclera: Conjunctivae normal.     Pupils: Pupils are equal, round, and reactive to light.     Comments: Corrective lens in place   Neck:     Vascular: No carotid bruit.  Cardiovascular:     Rate and Rhythm: Normal rate and regular rhythm.     Pulses: Normal pulses.     Heart sounds: Normal heart sounds. No murmur heard.  No friction rub. No gallop.   Pulmonary:     Effort: Pulmonary effort is normal. No respiratory distress.     Breath sounds: Normal breath sounds. No wheezing, rhonchi or rales.  Chest:     Chest wall: No tenderness.  Abdominal:     General: Bowel sounds are normal. There is no distension.     Palpations: Abdomen is soft. There is no mass.     Tenderness: There is no abdominal tenderness. There is no right CVA tenderness, left CVA tenderness, guarding or rebound.  Musculoskeletal:        General: No swelling or tenderness. Normal range of motion.     Cervical back: Normal range of motion. No rigidity or tenderness.     Right lower  leg: No edema.  Lymphadenopathy:     Cervical: No cervical adenopathy.  Skin:    General: Skin is warm and dry.     Coloration: Skin is not pale.     Findings: No bruising, erythema or rash.  Neurological:     Mental Status: She is alert and oriented to person, place, and time.     Cranial Nerves: No cranial nerve deficit.     Sensory: No sensory deficit.     Motor: No weakness.     Gait: Gait normal.  Psychiatric:        Mood and Affect: Mood normal.        Behavior: Behavior normal.        Thought Content: Thought content normal.        Judgment: Judgment normal.   Labs reviewed: Recent Labs    06/18/19 0822  NA 138  K 4.4  CL 101  CO2 25  GLUCOSE 101*  BUN 18  CREATININE 0.73  CALCIUM 9.3   Recent Labs    06/18/19 0822  AST 18  ALT 13  BILITOT 0.6  PROT 6.8   Recent Labs    06/18/19 0822  WBC 8.7  NEUTROABS 6,177  HGB 13.4  HCT 40.7  MCV 92.3  PLT 282   Lab Results  Component Value Date   TSH 1.52 06/07/2018   Lab Results  Component Value Date   HGBA1C 5.7 (H) 06/18/2019   Lab Results  Component Value Date   CHOL 178 06/18/2019   HDL 73 06/18/2019   LDLCALC 89 06/18/2019   TRIG 71 06/18/2019   CHOLHDL 2.4 06/18/2019    Significant Diagnostic Results in last 30 days:  No results found.  Assessment/Plan 1. Acute pharyngitis, unspecified etiology Afebrile.erythema with exudate noted.Will treat with Antibiotics as below side effects discussed. - Advised to gurgle throat with warm water and salt water.  - amoxicillin-clavulanate (AUGMENTIN) 875-125 MG tablet; Take 1 tablet by mouth 2 (two) times daily.  Dispense: 20 tablet; Refill: 0 - saccharomyces  boulardii (FLORASTOR) 250 MG capsule; Take 1 capsule (250 mg total) by mouth 2 (two) times daily.  Dispense: 20 capsule; Refill: 0 - guaiFENesin-dextromethorphan (ROBITUSSIN DM) 100-10 MG/5ML syrup; Take 5 mLs by mouth every 6 (six) hours as needed for cough.  Dispense: 118 mL; Refill: 0  2. Dry  cough Bilateral lung clear to auscultation.Robitussin DM to take at night as needed to prevent cough from keeping her awake.side effects discussed advised not to drive after using cough syrup to prevent accident due to  Drowsiness verbalized understanding.   - guaiFENesin-dextromethorphan (ROBITUSSIN DM) 100-10 MG/5ML syrup; Take 5 mLs by mouth every 6 (six) hours as needed for cough.  Dispense: 118 mL; Refill: 0 Family/ staff Communication: Reviewed plan of care with patient verbalized understanding.   Labs/tests ordered: None   Next Appointment: As needed if symptoms worsen or fail to improve   Sandrea Hughs, NP

## 2019-10-08 ENCOUNTER — Ambulatory Visit: Payer: Medicare PPO

## 2019-10-08 ENCOUNTER — Other Ambulatory Visit: Payer: Self-pay

## 2019-10-08 DIAGNOSIS — M810 Age-related osteoporosis without current pathological fracture: Secondary | ICD-10-CM | POA: Diagnosis not present

## 2019-10-08 MED ORDER — DENOSUMAB 60 MG/ML ~~LOC~~ SOSY
60.0000 mg | PREFILLED_SYRINGE | Freq: Once | SUBCUTANEOUS | Status: AC
  Administered 2019-10-08: 60 mg via SUBCUTANEOUS

## 2019-10-18 DIAGNOSIS — Z961 Presence of intraocular lens: Secondary | ICD-10-CM | POA: Diagnosis not present

## 2019-10-18 DIAGNOSIS — H532 Diplopia: Secondary | ICD-10-CM | POA: Diagnosis not present

## 2019-10-18 DIAGNOSIS — H0100A Unspecified blepharitis right eye, upper and lower eyelids: Secondary | ICD-10-CM | POA: Diagnosis not present

## 2019-10-18 DIAGNOSIS — H25812 Combined forms of age-related cataract, left eye: Secondary | ICD-10-CM | POA: Diagnosis not present

## 2019-12-19 DIAGNOSIS — L821 Other seborrheic keratosis: Secondary | ICD-10-CM | POA: Diagnosis not present

## 2019-12-19 DIAGNOSIS — L820 Inflamed seborrheic keratosis: Secondary | ICD-10-CM | POA: Diagnosis not present

## 2020-01-02 DIAGNOSIS — Z23 Encounter for immunization: Secondary | ICD-10-CM | POA: Diagnosis not present

## 2020-01-06 ENCOUNTER — Encounter: Payer: Self-pay | Admitting: Internal Medicine

## 2020-04-09 ENCOUNTER — Ambulatory Visit: Payer: Medicare PPO

## 2020-04-10 ENCOUNTER — Other Ambulatory Visit: Payer: Self-pay

## 2020-04-10 ENCOUNTER — Ambulatory Visit (INDEPENDENT_AMBULATORY_CARE_PROVIDER_SITE_OTHER): Payer: Medicare PPO | Admitting: *Deleted

## 2020-04-10 DIAGNOSIS — M810 Age-related osteoporosis without current pathological fracture: Secondary | ICD-10-CM | POA: Diagnosis not present

## 2020-04-10 MED ORDER — DENOSUMAB 60 MG/ML ~~LOC~~ SOSY
60.0000 mg | PREFILLED_SYRINGE | Freq: Once | SUBCUTANEOUS | Status: AC
  Administered 2020-04-10: 60 mg via SUBCUTANEOUS

## 2020-04-30 DIAGNOSIS — H43813 Vitreous degeneration, bilateral: Secondary | ICD-10-CM | POA: Diagnosis not present

## 2020-04-30 DIAGNOSIS — H25812 Combined forms of age-related cataract, left eye: Secondary | ICD-10-CM | POA: Diagnosis not present

## 2020-04-30 DIAGNOSIS — H5211 Myopia, right eye: Secondary | ICD-10-CM | POA: Diagnosis not present

## 2020-04-30 DIAGNOSIS — H532 Diplopia: Secondary | ICD-10-CM | POA: Diagnosis not present

## 2020-05-25 ENCOUNTER — Encounter: Payer: Self-pay | Admitting: Internal Medicine

## 2020-06-18 ENCOUNTER — Encounter: Payer: Medicare PPO | Admitting: Family

## 2020-06-19 ENCOUNTER — Encounter: Payer: Self-pay | Admitting: Family

## 2020-06-19 ENCOUNTER — Other Ambulatory Visit: Payer: Self-pay

## 2020-06-19 ENCOUNTER — Telehealth: Payer: Self-pay

## 2020-06-19 ENCOUNTER — Ambulatory Visit (INDEPENDENT_AMBULATORY_CARE_PROVIDER_SITE_OTHER): Payer: Medicare PPO | Admitting: Family

## 2020-06-19 DIAGNOSIS — Z0000 Encounter for general adult medical examination without abnormal findings: Secondary | ICD-10-CM

## 2020-06-19 NOTE — Patient Instructions (Signed)
Adriana [name] , Thank you for taking time to come for your Medicare Wellness Visit. I appreciate your ongoing commitment to your health goals. Please review the following plan we discussed and let me know if I can assist you in the future.   Screening recommendations/referrals: Colonoscopy: N/A  Mammogram: Up to date  Bone Density: Up to date  Recommended yearly ophthalmology/optometry visit for glaucoma screening and checkup Recommended yearly dental visit for hygiene and checkup  Vaccinations: Influenza vaccine : Up to date  Pneumococcal vaccine : Up to date  Tdap vaccine : Up to date  Shingles vaccine: Please get shingrix  Vaccine at your pharmacy    Advanced directives: Yes   Conditions/risks identified: Advance Age female > 23 yrs   Next appointment: 1 year    Preventive Care 31 Years and Older, Female Preventive care refers to lifestyle choices and visits with your health care provider that can promote health and wellness. What does preventive care include?  A yearly physical exam. This is also called an annual well check.  Dental exams once or twice a year.  Routine eye exams. Ask your health care provider how often you should have your eyes checked.  Personal lifestyle choices, including:  Daily care of your teeth and gums.  Regular physical activity.  Eating a healthy diet.  Avoiding tobacco and drug use.  Limiting alcohol use.  Practicing safe sex.  Taking low-dose aspirin every day.  Taking vitamin and mineral supplements as recommended by your health care provider. What happens during an annual well check? The services and screenings done by your health care provider during your annual well check will depend on your age, overall health, lifestyle risk factors, and family history of disease. Counseling  Your health care provider may ask you questions about your:  Alcohol use.  Tobacco use.  Drug use.  Emotional well-being.  Home and relationship  well-being.  Sexual activity.  Eating habits.  History of falls.  Memory and ability to understand (cognition).  Work and work Statistician.  Reproductive health. Screening  You may have the following tests or measurements:  Height, weight, and BMI.  Blood pressure.  Lipid and cholesterol levels. These may be checked every 5 years, or more frequently if you are over 25 years old.  Skin check.  Lung cancer screening. You may have this screening every year starting at age 17 if you have a 30-pack-year history of smoking and currently smoke or have quit within the past 15 years.  Fecal occult blood test (FOBT) of the stool. You may have this test every year starting at age 66.  Flexible sigmoidoscopy or colonoscopy. You may have a sigmoidoscopy every 5 years or a colonoscopy every 10 years starting at age 49.  Hepatitis C blood test.  Hepatitis B blood test.  Sexually transmitted disease (STD) testing.  Diabetes screening. This is done by checking your blood sugar (glucose) after you have not eaten for a while (fasting). You may have this done every 1-3 years.  Bone density scan. This is done to screen for osteoporosis. You may have this done starting at age 46.  Mammogram. This may be done every 1-2 years. Talk to your health care provider about how often you should have regular mammograms. Talk with your health care provider about your test results, treatment options, and if necessary, the need for more tests. Vaccines  Your health care provider may recommend certain vaccines, such as:  Influenza vaccine. This is recommended every year.  Tetanus, diphtheria, and acellular pertussis (Tdap, Td) vaccine. You may need a Td booster every 10 years.  Zoster vaccine. You may need this after age 28.  Pneumococcal 13-valent conjugate (PCV13) vaccine. One dose is recommended after age 57.  Pneumococcal polysaccharide (PPSV23) vaccine. One dose is recommended after age  41. Talk to your health care provider about which screenings and vaccines you need and how often you need them. This information is not intended to replace advice given to you by your health care provider. Make sure you discuss any questions you have with your health care provider. Document Released: 04/17/2015 Document Revised: 12/09/2015 Document Reviewed: 01/20/2015 Elsevier Interactive Patient Education  2017 [employer] Prevention in the Home Falls can cause injuries. They can happen to people of all ages. There are many things you can do to make your home safe and to help prevent falls. What can I do on the outside of my home?  Regularly fix the edges of walkways and driveways and fix any cracks.  Remove anything that might make you trip as you walk through a door, such as a raised step or threshold.  Trim any bushes or trees on the path to your home.  Use bright outdoor lighting.  Clear any walking paths of anything that might make someone trip, such as rocks or tools.  Regularly check to see if handrails are loose or broken. Make sure that both sides of any steps have handrails.  Any raised decks and porches should have guardrails on the edges.  Have any leaves, snow, or ice cleared regularly.  Use sand or salt on walking paths during winter.  Clean up any spills in your garage right away. This includes oil or grease spills. What can I do in the bathroom?  Use night lights.  Install grab bars by the toilet and in the tub and shower. Do not use towel bars as grab bars.  Use non-skid mats or decals in the tub or shower.  If you need to sit down in the shower, use a plastic, non-slip stool.  Keep the floor dry. Clean up any water that spills on the floor as soon as it happens.  Remove soap buildup in the tub or shower regularly.  Attach bath mats securely with double-sided non-slip rug tape.  Do not have throw rugs and other things on the floor that can make  you trip. What can I do in the bedroom?  Use night lights.  Make sure that you have a light by your bed that is easy to reach.  Do not use any sheets or blankets that are too big for your bed. They should not hang down onto the floor.  Have a firm chair that has side arms. You can use this for support while you get dressed.  Do not have throw rugs and other things on the floor that can make you trip. What can I do in the kitchen?  Clean up any spills right away.  Avoid walking on wet floors.  Keep items that you use a lot in easy-to-reach places.  If you need to reach something above you, use a strong step stool that has a grab bar.  Keep electrical cords out of the way.  Do not use floor polish or wax that makes floors slippery. If you must use wax, use non-skid floor wax.  Do not have throw rugs and other things on the floor that can make you trip. What can I do  with my stairs?  Do not leave any items on the stairs.  Make sure that there are handrails on both sides of the stairs and use them. Fix handrails that are broken or loose. Make sure that handrails are as long as the stairways.  Check any carpeting to make sure that it is firmly attached to the stairs. Fix any carpet that is loose or worn.  Avoid having throw rugs at the top or bottom of the stairs. If you do have throw rugs, attach them to the floor with carpet tape.  Make sure that you have a light switch at the top of the stairs and the bottom of the stairs. If you do not have them, ask someone to add them for you. What else can I do to help prevent falls?  Wear shoes that:  Do not have high heels.  Have rubber bottoms.  Are comfortable and fit you well.  Are closed at the toe. Do not wear sandals.  If you use a stepladder:  Make sure that it is fully opened. Do not climb a closed stepladder.  Make sure that both sides of the stepladder are locked into place.  Ask someone to hold it for you, if  possible.  Clearly mark and make sure that you can see:  Any grab bars or handrails.  First and last steps.  Where the edge of each step is.  Use tools that help you move around (mobility aids) if they are needed. These include:  Canes.  Walkers.  Scooters.  Crutches.  Turn on the lights when you go into a dark area. Replace any light bulbs as soon as they burn out.  Set up your furniture so you have a clear path. Avoid moving your furniture around.  If any of your floors are uneven, fix them.  If there are any pets around you, be aware of where they are.  Review your medicines with your doctor. Some medicines can make you feel dizzy. This can increase your chance of falling. Ask your doctor what other things that you can do to help prevent falls. This information is not intended to replace advice given to you by your health care provider. Make sure you discuss any questions you have with your health care provider. Document Released: 01/15/2009 Document Revised: 08/27/2015 Document Reviewed: 04/25/2014 Elsevier Interactive Patient Education  2017 [employer].

## 2020-06-19 NOTE — Telephone Encounter (Signed)
Ms. Adriana [name], Adriana [name] are scheduled for a virtual visit with your provider today.    Just as we do with appointments in the office, we must obtain your consent to participate.  Your consent will be active for this visit and any virtual visit you may have with one of our providers in the next 365 days.    If you have a MyChart account, I can also send a copy of this consent to you electronically.  All virtual visits are billed to your insurance company just like a traditional visit in the office.  As this is a virtual visit, video technology does not allow for your provider to perform a traditional examination.  This may limit your provider's ability to fully assess your condition.  If your provider identifies any concerns that need to be evaluated in person or the need to arrange testing such as labs, EKG, etc, we will make arrangements to do so.    Although advances in technology are sophisticated, we cannot ensure that it will always work on either your end or our end.  If the connection with a video visit is poor, we may have to switch to a telephone visit.  With either a video or telephone visit, we are not always able to ensure that we have a secure connection.   I need to obtain your verbal consent now.   Are you willing to proceed with your visit today?   Adriana [name] has provided verbal consent on 06/19/2020 for a virtual visit (video or telephone).   Otis [place], CMA 06/19/2020  8:30 AM

## 2020-06-19 NOTE — Progress Notes (Signed)
This service is provided via telemedicine  No vital signs collected/recorded due to the encounter was a telemedicine visit.   Location of patient (ex: home, work): Home.  Patient consents to a telephone visit: Yes  Location of the provider (ex: office, home): [facility].  Name of any referring provider: Gayland Curry, DO   Names of all persons participating in the telemedicine service and their role in the encounter: Patient, Heriberto [place], [place], [place], Webb Silversmith, NP.    Time spent on call: 8 minutes spent on the phone with Medical Assistant.     Subjective:   Adriana [name] is a 80 y.o. female who presents for Medicare Annual (Subsequent) preventive examination.  Review of Systems     Cardiac Risk Factors include: advanced age (>22men, >13 women)     Objective:    There were no vitals filed for this visit. There is no height or weight on file to calculate BMI.  Advanced Directives 06/19/2020 10/01/2019 06/24/2019 06/17/2019 06/15/2018 06/12/2017 06/12/2017  Does Patient Have a Medical Advance Directive? Yes Yes Yes Yes Yes Yes Yes  Type of Paramedic of [place];Living will Living will;Healthcare Power of [place];Living will Healthcare Power of [place] of [place]  Does patient want to make changes to medical advance directive? No - Patient declined No - Patient declined No - Patient declined No - Patient declined No - Patient declined No - Patient declined No - Patient declined  Copy of [place] in Chart? Yes - validated most recent copy scanned in chart (See row information) Yes - validated most recent copy scanned in chart (See row information) - Yes - validated most recent copy scanned in chart (See row information) Yes - validated most recent copy scanned in chart (See row information) Yes Yes  Would patient like  information on creating a medical advance directive? - - - - - - -    Current Medications (verified) Outpatient Encounter Medications as of 06/19/2020  Medication Sig  . Cholecalciferol (VITAMIN D-3) 1000 units CAPS Take 2,000 Units by mouth daily with supper.  . denosumab (PROLIA) 60 MG/ML SOLN injection Inject 60 mg into the skin every 6 (six) months. Administer in upper arm, thigh, or abdomen  . guaiFENesin-dextromethorphan (ROBITUSSIN DM) 100-10 MG/5ML syrup Take 5 mLs by mouth every 6 (six) hours as needed for cough.  Vladimir Faster Glycol-Propyl Glycol 0.4-0.3 % SOLN Place 1 drop into both eyes daily as needed (for dry eyes.).  Marland Kitchen saccharomyces boulardii (FLORASTOR) 250 MG capsule Take 1 capsule (250 mg total) by mouth 2 (two) times daily.  . vitamin B-12 (CYANOCOBALAMIN) 1000 MCG tablet Take 1,000 mcg by mouth daily.  . [DISCONTINUED] amoxicillin-clavulanate (AUGMENTIN) 875-125 MG tablet Take 1 tablet by mouth 2 (two) times daily.   No facility-administered encounter medications on file as of 06/19/2020.    Allergies (verified) Chlorhexidine, Adhesive [tape], and Latex   History: Past Medical History:  Diagnosis Date  . Cancer (HCC)    squamous cell face removed  . Cellulitis, toe    osteomylitis  . Cholelithiasis   . Diverticulitis   . Heart murmur    mitral valve proplase   . Hiatal hernia    hx of 25 years ago  . Hyperlipidemia   . Mitral valve prolapse   . Osteoporosis   . PONV (postoperative nausea and vomiting)    Past Surgical History:  Procedure Laterality Date  . BREAST BIOPSY     Left,negative   . CHOLECYSTECTOMY     11/02/16 Dr. Harlow Asa  . CHOLECYSTECTOMY N/A 11/02/2016   Procedure: LAPAROSCOPIC CHOLECYSTECTOMY WITH INTRAOPERATIVE CHOLANGIOGRAM;  Surgeon: Armandina Gemma, MD;  Location: [facility];  Service: General;  Laterality: N/A;  . COLONOSCOPY  05/2003   No polyps, hemorrhoids,and diverticulosis   . ENDOSCOPIC RETROGRADE CHOLANGIOPANCREATOGRAPHY (ERCP) WITH  PROPOFOL N/A 11/04/2016   Procedure: ENDOSCOPIC RETROGRADE CHOLANGIOPANCREATOGRAPHY (ERCP) WITH PROPOFOL;  Surgeon: [name], MD;  Location: [facility];  Service: Gastroenterology;  Laterality: N/A;  . EYE SURGERY     r eye cataract removed   Family History  Problem Relation Age of Onset  . Pneumonia Father 55  . Cerebrovascular Accident Mother 59  . Stroke Mother   . Breast cancer Sister 48   Social History   Socioeconomic History  . Marital status: Widowed    Spouse name: Not on file  . Number of children: Not on file  . Years of education: Not on file  . Highest education level: Not on file  Occupational History  . Not on file  Tobacco Use  . Smoking status: Never Smoker  . Smokeless tobacco: Never Used  Vaping Use  . Vaping Use: Never used  Substance and Sexual Activity  . Alcohol use: No  . Drug use: No  . Sexual activity: Never  Other Topics Concern  . Not on file  Social History Narrative   Dr.Stoneburner, Ophthalmologist   Dr.Peterson, Urologist   Dr.Lomax, Dermatologist     Social Determinants of Health   Financial Resource Strain: Not on file  Food Insecurity: Not on file  Transportation Needs: Not on file  Physical Activity: Not on file  Stress: Not on file  Social Connections: Not on file    Tobacco Counseling Counseling given: Not Answered   Clinical Intake:  Pre-visit preparation completed: No  Pain : No/denies pain     BMI - recorded: 17.71 Nutritional Status: BMI <19  Underweight Nutritional Risks: None Diabetes: No  How often do you need to have someone help you when you read instructions, pamphlets, or other written materials from your doctor or pharmacy?: 1 - Never What is the last grade level you completed in school?: Master's Degree  Diabetic? no   Activities of Daily Living In your present state of health, do you have any difficulty performing the following activities: 06/19/2020  Hearing? N  Vision? N  Difficulty  concentrating or making decisions? Y  Comment Forgets peoples names  Walking or climbing stairs? N  Dressing or bathing? N  Doing errands, shopping? N  Preparing Food and eating ? N  Using the Toilet? N  In the past six months, have you accidently leaked urine? Y  Comment occasionally  Do you have problems with loss of bowel control? N  Managing your Medications? N  Managing your Finances? N  Housekeeping or managing your Housekeeping? N  Some recent data might be hidden    Patient Care Team: Gayland Curry, DO as PCP - General (Geriatric Medicine) Shon Hough, MD as Consulting Physician (Ophthalmology) Gevena Cotton, MD as Consulting Physician (Ophthalmology)  Indicate any recent Medical Services you may have received from other than [facility] providers in the past year (date may be approximate).     Assessment:   This is a routine wellness examination for Adriana [name].  Hearing/Vision screen  Hearing Screening   125Hz  250Hz  500Hz  1000Hz  2000Hz  3000Hz  4000Hz  6000Hz  8000Hz   Right  ear:           Left ear:           Comments: No Hearing Concerns.   Vision Screening Comments: No Vision Concerns. Patient wears prescription glasses. Patient last eye exam was February 2022  Dietary issues and exercise activities discussed: Current Exercise Habits: Home exercise routine, Type of exercise: walking, Time (Minutes): 30, Frequency (Times/Week): 7, Weekly Exercise (Minutes/Week): 210, Intensity: Moderate, Exercise limited by: None identified  Goals    . <enter goal here>     Starting 06/06/16, I will maintain my current lifestyle.       Depression Screen PHQ 2/9 Scores 06/19/2020 06/17/2019 06/18/2018 06/15/2018 05/14/2018 12/11/2017 06/12/2017  PHQ - 2 Score 0 0 0 0 0 0 0  Exception Documentation - - - - - - -  Not completed - - - - - - -    Fall Risk Fall Risk  06/19/2020 10/01/2019 06/24/2019 06/17/2019 06/18/2018  Falls in the past year? 0 0 0 0 0  Number falls in past yr: 0 0 0 0 0   Injury with Fall? 0 0 0 0 0  Risk for fall due to : - - - - -  Follow up - - - - -    [place]:  Any stairs in or around the home? Yes  If so, are there any without handrails? Yes  Home free of loose throw rugs in walkways, pet beds, electrical cords, etc? No  Adequate lighting in your home to reduce risk of falls? Yes   ASSISTIVE DEVICES UTILIZED TO PREVENT FALLS:  Life alert? No  Use of a cane, walker or w/c? No  Grab bars in the bathroom? Yes  Shower chair or bench in shower? No  Elevated toilet seat or a handicapped toilet? No   TIMED UP AND GO:  Was the test performed? No .  Length of time to ambulate 10 feet: N/A sec.   Gait steady and fast without use of assistive device  Cognitive Function: MMSE - Mini Mental State Exam 06/15/2018 06/12/2017 06/06/2016 06/08/2015  Orientation to time 5 5 5 5   Orientation to Place 5 5 5 5   Registration 3 3 3 3   Attention/ Calculation 5 5 5 5   Recall 3 3 3 3   Language- name 2 objects 2 2 2 2   Language- repeat 1 1 1 1   Language- follow 3 step command 3 3 3 3   Language- read & follow direction 1 1 1 1   Write a sentence 1 1 1 1   Copy design 1 1 1 1   Total score 30 30 30 30      6CIT Screen 06/19/2020 06/17/2019  What Year? 0 points 0 points  What month? 0 points 0 points  What time? 0 points 0 points  Count back from 20 0 points 0 points  Months in reverse 0 points 0 points  Repeat phrase 0 points 0 points  Total Score 0 0    Immunizations Immunization History  Administered Date(s) Administered  . Influenza Split 01/30/2012  . Influenza, High Dose Seasonal PF 02/09/2017, 12/11/2017, 01/02/2019, 01/02/2020  . Influenza, Seasonal, Injecte, Preservative Fre 01/03/2015  . Influenza,inj,Quad PF,6+ Mos 01/08/2013, 12/08/2015  . Influenza-Unspecified 01/03/2014  . PFIZER(Purple Top)SARS-COV-2 Vaccination 05/31/2019, 06/25/2019, 01/10/2020  . Pneumococcal Conjugate-13 06/06/2014  . Pneumococcal  Polysaccharide-23 06/02/2005  . Td 04/04/2002  . Tdap 12/14/2013  . Zoster 04/04/2009    TDAP status: Up to date  Flu  Vaccine status: Up to date  Pneumococcal vaccine status: Up to date  Covid-19 vaccine status: Completed vaccines  Qualifies for Shingles Vaccine? Yes   Zostavax completed No   Shingrix Completed?: No.    Education has been provided regarding the importance of this vaccine. Patient has been advised to call insurance company to determine out of pocket expense if they have not yet received this vaccine. Advised may also receive vaccine at local pharmacy or Health Dept. Verbalized acceptance and understanding.  Screening Tests Health Maintenance  Topic Date Due  . DEXA SCAN  09/09/2020  . TETANUS/TDAP  12/15/2023  . INFLUENZA VACCINE  Completed  . COVID-19 Vaccine  Completed  . PNA vac Low Risk Adult  Completed  . HPV VACCINES  Aged Out    Health Maintenance  There are no preventive care reminders to display for this patient.  Colorectal cancer screening: No longer required.   Mammogram status: No longer required due to age .  Bone Density status: Completed 09/10/2018 . Results reflect: Bone density results: OSTEOPOROSIS. Repeat every 1 years.  Lung Cancer Screening: (Low Dose CT Chest recommended if Age 2-80 years, 30 pack-year currently smoking OR have quit w/in 15years.) does not qualify.   Lung Cancer Screening Referral: No   Additional Screening:  Hepatitis C Screening: does not qualify; Completed No   Vision Screening: Recommended annual ophthalmology exams for early detection of glaucoma and other disorders of the eye. Is the patient up to date with their annual eye exam?  Yes  Who is the provider or what is the name of the office in which the patient attends annual eye exams? [facility] with Dr.Stoneburg  If pt is not established with a provider, would they like to be referred to a provider to establish care? No .   Dental  Screening: Recommended annual dental exams for proper oral hygiene  Community Resource Referral / Chronic Care Management: CRR required this visit?  No   CCM required this visit?  No     Plan:    - Shingrix vaccine  I have personally reviewed and noted the following in the patient's chart:   . Medical and social history . Use of alcohol, tobacco or illicit drugs  . Current medications and supplements . Functional ability and status . Nutritional status . Physical activity . Advanced directives . List of other physicians . Hospitalizations, surgeries, and ER visits in previous 12 months . Vitals . Screenings to include cognitive, depression, and falls . Referrals and appointments  In addition, I have reviewed and discussed with patient certain preventive protocols, quality metrics, and best practice recommendations. A written personalized care plan for preventive services as well as general preventive health recommendations were provided to patient.    Sandrea Hughs, NP   06/19/2020   Nurse Notes:Advised to get her Shingrix vaccine at her pharmacy.

## 2020-06-22 ENCOUNTER — Other Ambulatory Visit: Payer: Medicare PPO

## 2020-06-22 ENCOUNTER — Other Ambulatory Visit: Payer: Self-pay

## 2020-06-22 DIAGNOSIS — Z0000 Encounter for general adult medical examination without abnormal findings: Secondary | ICD-10-CM

## 2020-06-22 DIAGNOSIS — E7800 Pure hypercholesterolemia, unspecified: Secondary | ICD-10-CM

## 2020-06-22 DIAGNOSIS — R636 Underweight: Secondary | ICD-10-CM

## 2020-06-22 DIAGNOSIS — R739 Hyperglycemia, unspecified: Secondary | ICD-10-CM | POA: Diagnosis not present

## 2020-06-23 LAB — COMPLETE METABOLIC PANEL WITH GFR
AG Ratio: 2 (calc) (ref 1.0–2.5)
ALT: 16 U/L (ref 6–29)
AST: 17 U/L (ref 10–35)
Albumin: 4.6 g/dL (ref 3.6–5.1)
Alkaline phosphatase (APISO): 59 U/L (ref 37–153)
BUN: 17 mg/dL (ref 7–25)
CO2: 30 mmol/L (ref 20–32)
Calcium: 9.4 mg/dL (ref 8.6–10.4)
Chloride: 102 mmol/L (ref 98–110)
Creat: 0.65 mg/dL (ref 0.60–0.88)
GFR, Est African American: 97 mL/min/{1.73_m2} (ref >=60)
GFR, Est Non African American: 84 mL/min/{1.73_m2} (ref >=60)
Globulin: 2.3 g/dL (calc) (ref 1.9–3.7)
Glucose, Bld: 97 mg/dL (ref 65–99)
Potassium: 4.2 mmol/L (ref 3.5–5.3)
Sodium: 141 mmol/L (ref 135–146)
Total Bilirubin: 0.6 mg/dL (ref 0.2–1.2)
Total Protein: 6.9 g/dL (ref 6.1–8.1)

## 2020-06-23 LAB — CBC WITH DIFFERENTIAL/PLATELET
Absolute Monocytes: 454 cells/uL (ref 200–950)
Basophils Absolute: 61 cells/uL (ref 0–200)
Basophils Relative: 1.2 %
Eosinophils Absolute: 143 cells/uL (ref 15–500)
Eosinophils Relative: 2.8 %
HCT: 42.8 % (ref 35.0–45.0)
Hemoglobin: 13.9 g/dL (ref 11.7–15.5)
Lymphs Abs: 1474 cells/uL (ref 850–3900)
MCH: 30.2 pg (ref 27.0–33.0)
MCHC: 32.5 g/dL (ref 32.0–36.0)
MCV: 92.8 fL (ref 80.0–100.0)
MPV: 10.9 fL (ref 7.5–12.5)
Monocytes Relative: 8.9 %
Neutro Abs: 2968 cells/uL (ref 1500–7800)
Neutrophils Relative %: 58.2 %
Platelets: 235 10*3/uL (ref 140–400)
RBC: 4.61 10*6/uL (ref 3.80–5.10)
RDW: 12.8 % (ref 11.0–15.0)
Total Lymphocyte: 28.9 %
WBC: 5.1 10*3/uL (ref 3.8–10.8)

## 2020-06-23 LAB — LIPID PANEL
Cholesterol: 166 mg/dL (ref <200)
HDL: 76 mg/dL (ref >=50)
LDL Cholesterol (Calc): 74 mg/dL (calc)
Non-HDL Cholesterol (Calc): 90 mg/dL (calc) (ref <130)
Total CHOL/HDL Ratio: 2.2 (calc) (ref <5.0)
Triglycerides: 77 mg/dL (ref <150)

## 2020-06-23 LAB — TSH: TSH: 2.08 mIU/L (ref 0.40–4.50)

## 2020-06-23 LAB — IRON,TIBC AND FERRITIN PANEL
%SAT: 33 % (calc) (ref 16–45)
Ferritin: 60 ng/mL (ref 16–288)
Iron: 112 ug/dL (ref 45–160)
TIBC: 335 mcg/dL (calc) (ref 250–450)

## 2020-06-23 LAB — HEMOGLOBIN A1C
Hgb A1c MFr Bld: 5.6 % of total Hgb (ref <5.7)
Mean Plasma Glucose: 114 mg/dL
eAG (mmol/L): 6.3 mmol/L

## 2020-06-23 NOTE — Progress Notes (Signed)
Labs look great.

## 2020-06-25 ENCOUNTER — Ambulatory Visit: Payer: Medicare PPO | Admitting: Internal Medicine

## 2020-06-29 DIAGNOSIS — M810 Age-related osteoporosis without current pathological fracture: Secondary | ICD-10-CM | POA: Diagnosis not present

## 2020-06-29 DIAGNOSIS — R7303 Prediabetes: Secondary | ICD-10-CM | POA: Diagnosis not present

## 2020-06-29 DIAGNOSIS — H518 Other specified disorders of binocular movement: Secondary | ICD-10-CM | POA: Diagnosis not present

## 2020-06-29 DIAGNOSIS — K219 Gastro-esophageal reflux disease without esophagitis: Secondary | ICD-10-CM | POA: Diagnosis not present

## 2020-06-29 DIAGNOSIS — K4090 Unilateral inguinal hernia, without obstruction or gangrene, not specified as recurrent: Secondary | ICD-10-CM | POA: Diagnosis not present

## 2020-06-29 DIAGNOSIS — K649 Unspecified hemorrhoids: Secondary | ICD-10-CM | POA: Diagnosis not present

## 2020-10-09 ENCOUNTER — Ambulatory Visit: Payer: Medicare PPO

## 2021-05-06 ENCOUNTER — Other Ambulatory Visit: Payer: Self-pay | Admitting: Family Medicine

## 2021-05-06 DIAGNOSIS — Z13820 Encounter for screening for osteoporosis: Secondary | ICD-10-CM

## 2021-05-06 DIAGNOSIS — Z780 Asymptomatic menopausal state: Secondary | ICD-10-CM

## 2021-05-31 LAB — HM DEXA SCAN

## 2021-06-24 ENCOUNTER — Encounter: Payer: Self-pay | Admitting: Family

## 2021-07-30 NOTE — Progress Notes (Deleted)
Office Visit Note  Patient: Adriana [name]             Date of Birth: 01-04-1941           MRN: [id number]             PCP: No primary care provider on file. Referring: Margretta Sidle, MD Visit Date: 08/13/2021 Occupation: '@GUAROCC'$ @  Subjective:  No chief complaint on file.   History of Present Illness: Adriana [name] is a 81 y.o. female ***   Activities of Daily Living:  Patient reports morning stiffness for *** {minute/hour:19697}.   Patient {ACTIONS;DENIES/REPORTS:21021675::"Denies"} nocturnal pain.  Difficulty dressing/grooming: {ACTIONS;DENIES/REPORTS:21021675::"Denies"} Difficulty climbing stairs: {ACTIONS;DENIES/REPORTS:21021675::"Denies"} Difficulty getting out of chair: {ACTIONS;DENIES/REPORTS:21021675::"Denies"} Difficulty using hands for taps, buttons, cutlery, and/or writing: {ACTIONS;DENIES/REPORTS:21021675::"Denies"}  No Rheumatology ROS completed.   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   Vegetarian diet 06/14/2015   GERD (gastroesophageal reflux disease)    Senile osteoporosis 02/08/2013    Past Medical History:  Diagnosis Date   Cancer ([place])    squamous cell face removed   Cellulitis, toe    osteomylitis   Cholelithiasis    Diverticulitis    Heart murmur    mitral valve proplase    Hiatal hernia    hx of 25 years ago   Hyperlipidemia    Mitral valve prolapse    Osteoporosis    PONV (postoperative nausea and vomiting)     Family History  Problem Relation Age of Onset   Pneumonia Father 4   Cerebrovascular Accident Mother 79   Stroke Mother    Breast cancer Sister 91   Past Surgical History:  Procedure  Laterality Date   BREAST BIOPSY     Left,negative    CHOLECYSTECTOMY     11/02/16 Dr. Harlow Asa   CHOLECYSTECTOMY N/A 11/02/2016   Procedure: LAPAROSCOPIC CHOLECYSTECTOMY WITH INTRAOPERATIVE CHOLANGIOGRAM;  Surgeon: Armandina Gemma, MD;  Location: [facility];  Service: General;  Laterality: N/A;   COLONOSCOPY  05/2003   No polyps, hemorrhoids,and diverticulosis    ENDOSCOPIC RETROGRADE CHOLANGIOPANCREATOGRAPHY (ERCP) WITH PROPOFOL N/A 11/04/2016   Procedure: ENDOSCOPIC RETROGRADE CHOLANGIOPANCREATOGRAPHY (ERCP) WITH PROPOFOL;  Surgeon: [name], MD;  [facility];  Service: Gastroenterology;  Laterality: N/A;   EYE SURGERY     r eye cataract removed   Social History   Social History Narrative   Dr.Stoneburner, Ophthalmologist   Dr.Peterson, Urologist   Dr.Lomax, Dermatologist     Immunization History  Administered Date(s) Administered   Influenza Split 01/30/2012   Influenza, High Dose Seasonal PF 02/09/2017, 12/11/2017, 01/02/2019, 01/02/2020   Influenza, Seasonal, Injecte, Preservative Fre 01/03/2015   Influenza,inj,Quad PF,6+ Mos 01/08/2013, 12/08/2015   Influenza-Unspecified 01/03/2014   PFIZER(Purple Top)SARS-COV-2 Vaccination 05/31/2019, 06/25/2019, 01/10/2020   Pneumococcal Conjugate-13 06/06/2014   Pneumococcal Polysaccharide-23 06/02/2005   Td 04/04/2002   Tdap 12/14/2013   Zoster, Live 04/04/2009     Objective: Vital Signs: There were no vitals taken for this visit.   Physical Exam   Musculoskeletal Exam: ***  CDAI Exam: CDAI Score: -- Patient Global: --; Provider Global: -- Swollen: --; Tender: -- Joint Exam 08/13/2021   No joint exam has been documented for this visit   There is currently no information  documented on the homunculus. Go to the Rheumatology activity and complete the homunculus joint exam.  Investigation: No additional findings.  Imaging: No results found.  Recent Labs: Lab Results  Component Value Date   WBC 5.1 06/22/2020   HGB  13.9 06/22/2020   PLT 235 06/22/2020   NA 141 06/22/2020   K 4.2 06/22/2020   CL 102 06/22/2020   CO2 30 06/22/2020   GLUCOSE 97 06/22/2020   BUN 17 06/22/2020   CREATININE 0.65 06/22/2020   BILITOT 0.6 06/22/2020   ALKPHOS 77 11/04/2016   AST 17 06/22/2020   ALT 16 06/22/2020   PROT 6.9 06/22/2020   ALBUMIN 3.7 11/04/2016   CALCIUM 9.4 06/22/2020   GFRAA 97 06/22/2020    Speciality Comments: No specialty comments available.  Procedures:  No procedures performed Allergies: Chlorhexidine, Adhesive [tape], and Latex   Assessment / Plan:     Visit Diagnoses: Age-related osteoporosis without current pathological fracture - DEXA updated on 05/31/21: RFN BMD 0.531 T-score -2.9.  Prolia-started by previous PCP.  Gastroesophageal reflux disease without esophagitis  Calculus of gallbladder without cholecystitis without obstruction  Orders: No orders of the defined types were placed in this encounter.  No orders of the defined types were placed in this encounter.   Face-to-face time spent with patient was *** minutes. Greater than 50% of time was spent in counseling and coordination of care.  Follow-Up Instructions: No follow-ups on file.   Ofilia Neas, PA-C  Note - This record has been created using Dragon software.  Chart creation errors have been sought, but may not always  have been located. Such creation errors do not reflect on  the standard of medical care.

## 2021-08-13 ENCOUNTER — Ambulatory Visit: Payer: Medicare PPO | Admitting: Rheumatology

## 2021-08-13 DIAGNOSIS — K8020 Calculus of gallbladder without cholecystitis without obstruction: Secondary | ICD-10-CM

## 2021-08-13 DIAGNOSIS — K219 Gastro-esophageal reflux disease without esophagitis: Secondary | ICD-10-CM

## 2021-08-13 DIAGNOSIS — M810 Age-related osteoporosis without current pathological fracture: Secondary | ICD-10-CM

## 2021-09-03 ENCOUNTER — Ambulatory Visit: Payer: Medicare PPO | Admitting: Rheumatology

## 2022-01-02 HISTORY — PX: HERNIA REPAIR: SHX51

## 2022-02-03 NOTE — Progress Notes (Signed)
Office Visit Note  Patient: Adriana [name]             Date of Birth: October 15, 1940           MRN: [id number]             PCP: Margretta Sidle, MD Referring: Margretta Sidle, MD Visit Date: 02/17/2022 Occupation: '@GUAROCC'$ @  Subjective:  Treatment of osteoporosis  History of Present Illness: Adriana [name] is a 81 y.o. female consultation per request of her PCP for the evaluation of osteoporosis.  Patient states that she was diagnosed with osteoporosis when she was in her 28s.  Initially she was treated with Fosamax for many years.  She recalls having interruption in the therapy in between.  Then she was switched to [facility] which she took for 18 months.  After completing the course of Forteo she started Prolia injections about 5 years ago.  She has been taking Prolia injections on a regular basis.  Her last Prolia injection was 2 years ago. DEXA performed at [facility] 09/10/2018: AP spine BMD 0.655 with T score -3.6.  Statistically significant increase in BMD in AP spine and right femoral neck.  She denies history of intake of Synthroid, any antiepileptics or PPIs.  She consumes calcium rich diet and takes vitamin D 1000 units daily.  She has been pescatarian most of her life.  She had menopause at age 23.  She denies any personal or family history of fractures.  There is no family history of osteoporosis.  She reports 4 inch height loss over the years.  She is gravida 2, para 2.  She has been walking for exercise.  She had a abdominal hernia repair in October 2023.  She is still recovering from it.  [facility]   Activities of Daily Living:  Patient reports morning stiffness for several hours.   Patient Denies nocturnal pain.  Difficulty dressing/grooming: Denies Difficulty climbing stairs: Denies Difficulty getting out of chair: Denies Difficulty using hands for taps, buttons, cutlery, and/or writing: Denies  Review of Systems  Constitutional:  Negative  for fatigue.  HENT:  Negative for mouth sores and mouth dryness.   Eyes:  Positive for dryness.  Respiratory:  Negative for shortness of breath.   Cardiovascular:  Negative for chest pain and palpitations.  Gastrointestinal:  Negative for blood in stool, constipation and diarrhea.  Endocrine: Negative for increased urination.  Genitourinary:  Negative for involuntary urination.  Musculoskeletal:  Positive for morning stiffness. Negative for joint pain, gait problem, joint pain, joint swelling, myalgias, muscle weakness, muscle tenderness and myalgias.  Skin:  Negative for color change, rash, hair loss and sensitivity to sunlight.  Allergic/Immunologic: Negative for susceptible to infections.  Neurological:  Negative for dizziness and headaches.  Hematological:  Negative for swollen glands.  Psychiatric/Behavioral:  Negative for depressed mood and sleep disturbance. The patient is not nervous/anxious.     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   Vegetarian diet 06/14/2015   GERD (gastroesophageal reflux disease)    Senile osteoporosis 02/08/2013    Past Medical History:  Diagnosis Date   Cancer ([place])    squamous cell  face removed   Cellulitis, toe    osteomylitis   Cholelithiasis    Diverticulitis    Heart murmur    mitral valve proplase    Hiatal hernia    hx of 25 years ago   Hyperlipidemia    Mitral valve prolapse    Osteoporosis    PONV (postoperative nausea and vomiting)     Family History  Problem Relation Age of Onset   Cerebrovascular Accident Mother 9   Stroke Mother    Pneumonia Father 2   Breast cancer Sister    Healthy Son     Healthy Son    Past Surgical History:  Procedure Laterality Date   CHOLECYSTECTOMY     11/02/16 Dr. Harlow Asa   CHOLECYSTECTOMY N/A 11/02/2016   Procedure: LAPAROSCOPIC CHOLECYSTECTOMY WITH INTRAOPERATIVE CHOLANGIOGRAM;  Surgeon: Armandina Gemma, MD;  Location: [facility];  Service: General;  Laterality: N/A;   COLONOSCOPY  05/2003   No polyps, hemorrhoids,and diverticulosis    ENDOSCOPIC RETROGRADE CHOLANGIOPANCREATOGRAPHY (ERCP) WITH PROPOFOL N/A 11/04/2016   Procedure: ENDOSCOPIC RETROGRADE CHOLANGIOPANCREATOGRAPHY (ERCP) WITH PROPOFOL;  Surgeon: [name], MD;  Location: [facility];  Service: Gastroenterology;  Laterality: N/A;   EYE SURGERY     bilateral cataracts   HERNIA REPAIR  01/2022   Social History   Social History Narrative   Dr.Stoneburner, Ophthalmologist   Dr.Peterson, Urologist   Dr.Lomax, Dermatologist     Immunization History  Administered Date(s) Administered   Influenza Split 01/30/2012   Influenza, High Dose Seasonal PF 02/09/2017, 12/11/2017, 01/02/2019, 01/02/2020   Influenza, Seasonal, Injecte, Preservative Fre 01/03/2015   Influenza,inj,Quad PF,6+ Mos 01/08/2013, 12/08/2015   Influenza-Unspecified 01/03/2014   PFIZER(Purple Top)SARS-COV-2 Vaccination 05/31/2019, 06/25/2019, 01/10/2020   Pneumococcal Conjugate-13 06/06/2014   Pneumococcal Polysaccharide-23 06/02/2005   Td 04/04/2002   Tdap 12/14/2013   Zoster, Live 04/04/2009     Objective: Vital Signs: BP (!) 169/75 (BP Location: Right Arm, Patient Position: Sitting, Cuff Size: Normal)   Pulse 90   Resp 14   Ht 5' 2.5" (1.588 m)   Wt 106 lb 12.8 oz (48.4 kg)   BMI 19.22 kg/m    Physical Exam Vitals and nursing note reviewed.  Constitutional:      Appearance: She is well-developed.  HENT:     Head: Normocephalic and atraumatic.  Eyes:     Conjunctiva/sclera: Conjunctivae normal.  Cardiovascular:     Rate and Rhythm: Normal rate and regular rhythm.     Heart sounds: Normal heart sounds.   Pulmonary:     Effort: Pulmonary effort is normal.     Breath sounds: Normal breath sounds.  Abdominal:     General: Bowel sounds are normal.     Palpations: Abdomen is soft.  Musculoskeletal:     Cervical back: Normal range of motion.  Lymphadenopathy:     Cervical: No cervical adenopathy.  Skin:    General: Skin is warm and dry.     Capillary Refill: Capillary refill takes less than 2 seconds.  Neurological:     Mental Status: She is alert and oriented to person, place, and time.  Psychiatric:        Behavior: Behavior normal.      Musculoskeletal Exam:) Was in good range of motion.  Thoracic spine was in good range of motion.  She had difficulty reaching her toes due to recent abdominal surgery.  There was no point tenderness over vertebral lumbar spine.  Shoulder joints, elbow joints, wrist joints, MCPs PIPs and DIPs and good range of  motion with no synovitis.  Hip joints, knee joints, ankles, MTPs and PIPs with good range of motion with no synovitis.  CDAI Exam: CDAI Score: -- Patient Global: --; Provider Global: -- Swollen: --; Tender: -- Joint Exam 02/17/2022   No joint exam has been documented for this visit   There is currently no information documented on the homunculus. Go to the Rheumatology activity and complete the homunculus joint exam.  Investigation: No additional findings.  Imaging: No results found.  Recent Labs: Lab Results  Component Value Date   WBC 5.1 06/22/2020   HGB 13.9 06/22/2020   PLT 235 06/22/2020   NA 141 06/22/2020   K 4.2 06/22/2020   CL 102 06/22/2020   CO2 30 06/22/2020   GLUCOSE 97 06/22/2020   BUN 17 06/22/2020   CREATININE 0.65 06/22/2020   BILITOT 0.6 06/22/2020   ALKPHOS 77 11/04/2016   AST 17 06/22/2020   ALT 16 06/22/2020   PROT 6.9 06/22/2020   ALBUMIN 3.7 11/04/2016   CALCIUM 9.4 06/22/2020   GFRAA 97 06/22/2020    Speciality Comments: Prior therapy: Bisphosphonates, Forteo: 18 months completed, and then  transitioned to Prolia (about 5 years ago).  Last [id number]  Procedures:  No procedures performed Allergies: Chlorhexidine, Adhesive [tape], and Latex   Assessment / Plan:     Visit Diagnoses: Senile osteoporosis -patient was diagnosed with osteoporosis in her 24s.  She states she was on Fosamax for many years.  Then she was switched to [facility] due to deterioration of bone mass density.  She took Comptroller for 18 months.  After Forteo she was switched to Prolia injections.  She states she took Prolia for 5 years and then Prolia was discontinued.  She has been off Prolia for the last 2 years.  Her bone density in 2020 was stable.  She has not had a repeat bone density.  She has noticed 4 inches of height loss over the years.  She denies any thoracic or lower back pain.  She has been taking calcium rich diet and vitamin D 1000 units daily.  There is no history of fracture.  There is no family history of osteoporosis or fractures.  There is no history of intake of thyroid medications, antiepileptics or PPIs.  Detailed counsel regarding osteoporosis was provided.  Different treatment options and their side effects were discussed.  Patient does not want to take anabolic agents.  She did well on Prolia in the past.  Indications side effects contraindications of Prolia were discussed at length including increased risk of vertebral fractures and nonvertebral fractures was discussed after discontinuing Prolia.  Increased risk of osteonecrosis of the jaw, hypocalcemia, bone pain, atypical fractures were also discussed.  Patient had significant height loss.  I will obtain x-rays today to look for vertebral fractures.  I will also obtain additional labs today.. - Plan: Parathyroid hormone, intact (no Ca), VITAMIN D 25 Hydroxy (Vit-D Deficiency, Fractures), Phosphorus, Serum protein electrophoresis with reflex, DG DXA FRACTURE ASSESSMENT, XR Lumbar Spine 2-3 Views, XR Thoracic Spine 2 Views.  Height loss -patient reports 4  inches of height loss.  She had no vertebral tenderness.  I will obtain x-rays today.  Plan: DG DXA FRACTURE ASSESSMENT, XR Lumbar Spine 2-3 Views, XR Thoracic Spine 2 View  Medication monitoring encounter - Plan: CBC with Differential/Platelet, COMPLETE METABOLIC PANEL WITH GFR  Other fatigue -she gives history of fatigue.  Plan: TSH  Gastroesophageal reflux disease without esophagitis-she is currently not taking any medications.  Calculus  of gallbladder without cholecystitis without obstruction-she avoids Same supplement but she consumes calcium rich diet.  Vegetarian diet-she is mostly vegetarian and occasionally consumes fish.   She also takes B12.  Hyperglycemia  Divergence insufficiency  Orders: Orders Placed This Encounter  Procedures   DG DXA FRACTURE ASSESSMENT   XR Lumbar Spine 2-3 Views   XR Thoracic Spine 2 View   CBC with Differential/Platelet   COMPLETE METABOLIC PANEL WITH GFR   TSH   Parathyroid hormone, intact (no Ca)   VITAMIN D 25 Hydroxy (Vit-D Deficiency, Fractures)   Phosphorus   Serum protein electrophoresis with reflex   No orders of the defined types were placed in this encounter.    Follow-Up Instructions: Return for Osteoporosis.   Bo Merino, MD  Note - This record has been created using Editor, commissioning.  Chart creation errors have been sought, but may not always  have been located. Such creation errors do not reflect on  the standard of medical care.

## 2022-02-17 ENCOUNTER — Ambulatory Visit: Payer: Medicare PPO | Attending: Rheumatology | Admitting: Rheumatology

## 2022-02-17 ENCOUNTER — Other Ambulatory Visit: Payer: Self-pay

## 2022-02-17 ENCOUNTER — Ambulatory Visit: Payer: Medicare PPO

## 2022-02-17 ENCOUNTER — Telehealth: Payer: Self-pay | Admitting: Pharmacist

## 2022-02-17 ENCOUNTER — Encounter: Payer: Self-pay | Admitting: Rheumatology

## 2022-02-17 VITALS — BP 169/75 | HR 90 | Resp 14 | Ht 62.5 in | Wt 106.8 lb

## 2022-02-17 DIAGNOSIS — R5383 Other fatigue: Secondary | ICD-10-CM

## 2022-02-17 DIAGNOSIS — K8020 Calculus of gallbladder without cholecystitis without obstruction: Secondary | ICD-10-CM

## 2022-02-17 DIAGNOSIS — K219 Gastro-esophageal reflux disease without esophagitis: Secondary | ICD-10-CM

## 2022-02-17 DIAGNOSIS — M810 Age-related osteoporosis without current pathological fracture: Secondary | ICD-10-CM | POA: Diagnosis not present

## 2022-02-17 DIAGNOSIS — Z5181 Encounter for therapeutic drug level monitoring: Secondary | ICD-10-CM

## 2022-02-17 DIAGNOSIS — R739 Hyperglycemia, unspecified: Secondary | ICD-10-CM

## 2022-02-17 DIAGNOSIS — R29890 Loss of height: Secondary | ICD-10-CM | POA: Diagnosis not present

## 2022-02-17 DIAGNOSIS — H518 Other specified disorders of binocular movement: Secondary | ICD-10-CM

## 2022-02-17 DIAGNOSIS — Z789 Other specified health status: Secondary | ICD-10-CM

## 2022-02-17 NOTE — Telephone Encounter (Signed)
Please start [place] through pharmacy AND medical benefit.  Prolia - [id number], [id number]  Dose: '60mg'$  SQ every 6 months  Dx: Age-related osteoporosis (M80.0)  Previously tried therapies: Prolia x 5 years (stopped) Fosamax Forteo x 18 months  Knox Saliva, PharmD, MPH, BCPS, CPP Clinical Pharmacist (Rheumatology and Pulmonology)

## 2022-02-17 NOTE — Patient Instructions (Addendum)
Denosumab Injection (Osteoporosis) What is this medication? DENOSUMAB (den oh SUE mab) prevents and treats osteoporosis. It works by Paramedic stronger and less likely to break (fracture). It is a monoclonal antibody. This medicine may be used for other purposes; ask your health care provider or pharmacist if you have questions. COMMON BRAND NAME(S): Prolia What should I tell my care team before I take this medication? They need to know if you have any of these conditions: Dental or gum disease, or plan to have dental surgery or a tooth pulled Infection Kidney disease Low levels of calcium or vitamin D in your blood On dialysis Poor nutrition Skin conditions Thyroid disease, or have had thyroid or parathyroid surgery Trouble absorbing minerals in your stomach or intestine An unusual or allergic reaction to denosumab, other medications, foods, dyes, or preservatives Pregnant or trying to get pregnant Breastfeeding How should I use this medication? This medication is injected under the skin. It is given by your care team in a hospital or clinic setting. A special MedGuide will be given to you before each treatment. Be sure to read this information carefully each time. Talk to your care team about the use of this medication in children. Special care may be needed. Overdosage: If you think you have taken too much of this medicine contact a poison control center or emergency room at once. NOTE: This medicine is only for you. Do not share this medicine with others. What if I miss a dose? Keep appointments for follow-up doses. It is important not to miss your dose. Call your care team if you are unable to keep an appointment. What may interact with this medication? Do not take this medication with any of the following: Other medications that contain denosumab This medication may also interact with the following: Medications that lower your chance of fighting infection Steroid  medications, such as prednisone or cortisone This list may not describe all possible interactions. Give your health care provider a list of all the medicines, herbs, non-prescription drugs, or dietary supplements you use. Also tell them if you smoke, drink alcohol, or use illegal drugs. Some items may interact with your medicine. What should I watch for while using this medication? Your condition will be monitored carefully while you are receiving this medication. You may need blood work while taking this medication. This medication may increase your risk of getting an infection. Call your care team for advice if you get a fever, chills, sore throat, or other symptoms of a cold or flu. Do not treat yourself. Try to avoid being around people who are sick. Tell your dentist and dental surgeon that you are taking this medication. You should not have major dental surgery while on this medication. See your dentist to have a dental exam and fix any dental problems before starting this medication. Take good care of your teeth while on this medication. Make sure you see your dentist for regular follow-up appointments. You should make sure you get enough calcium and vitamin D while you are taking this medication. Discuss the foods you eat and the vitamins you take with your care team. Talk to your care team if you are pregnant or think you might be pregnant. This medication can cause serious birth defects if taken during pregnancy and for 5 months after the last dose. You will need a negative pregnancy test before starting this medication. Contraception is recommended while taking this medication and for 5 months after the last dose. Your care  team can help you find the option that works for you. Talk to your care team before breastfeeding. Changes to your treatment plan may be needed. What side effects may I notice from receiving this medication? Side effects that you should report to your care team as soon as  possible: Allergic reactions--skin rash, itching, hives, swelling of the face, lips, tongue, or throat Infection--fever, chills, cough, sore throat, wounds that don't heal, pain or trouble when passing urine, general feeling of discomfort or being unwell Low calcium level--muscle pain or cramps, confusion, tingling, or numbness in the hands or feet Osteonecrosis of the jaw--pain, swelling, or redness in the mouth, numbness of the jaw, poor healing after dental work, unusual discharge from the mouth, visible bones in the mouth Severe bone, joint, or muscle pain Skin infection--skin redness, swelling, warmth, or pain Side effects that usually do not require medical attention (report these to your care team if they continue or are bothersome): Back pain Headache Joint pain Muscle pain Pain in the hands, arms, legs, or feet Runny or stuffy nose Sore throat This list may not describe all possible side effects. Call your doctor for medical advice about side effects. You may report side effects to FDA at 1-800-FDA-1088. Where should I keep my medication? This medication is given in a hospital or clinic. It will not be stored at home. NOTE: This sheet is a summary. It may not cover all possible information. If you have questions about this medicine, talk to your doctor, pharmacist, or health care provider.  2023 Elsevier/Gold Standard (2021-08-02 00:00:00)  Osteoporosis  Osteoporosis is when the bones get thin and weak. This can cause your bones to break (fracture) more easily. What are the causes? The exact cause of this condition is not known. What increases the risk? Having family members with this condition. Not eating enough healthy foods. Taking certain medicines. Being female. Being age 61 or older. Smoking or using other products that contain nicotine or tobacco, such as e-cigarettes or chewing tobacco. Not exercising. Being of European or Asian ancestry. Having a small body  frame. What are the signs or symptoms? A broken bone might be the first sign, especially if the break results from a fall or injury that usually would not cause a bone to break. Other signs and symptoms include: Pain in the neck or low back. Being hunched over (stooped posture). Getting shorter. How is this treated? Eating more foods with more calcium and vitamin D in them. Doing exercises. Stopping tobacco use. Limiting how much alcohol you drink. Taking medicines to slow bone loss or help make the bones stronger. Taking supplements of calcium and vitamin D every day. Taking medicines to replace chemicals in the body (hormone replacement medicines). Monitoring your levels of calcium and vitamin D. The goal of treatment is to strengthen your bones and lower your risk for a bone break. Follow these instructions at home: Eating and drinking Eat plenty of calcium and vitamin D. These nutrients are good for your bones. Good sources of calcium and vitamin D include: Some fish, such as salmon and tuna. Foods that have calcium and vitamin D added to them (fortified foods), such as some breakfast cereals. Egg yolks. Cheese. Liver.  Activity Do exercises as told by your doctor. Ask your doctor what exercises are safe for you. You should do: Exercises that make your muscles work to hold your body weight up (weight-bearing exercises). These include tai chi, yoga, and walking. Exercises to make your muscles stronger.  One example is lifting weights. Lifestyle Do not drink alcohol if: Your doctor tells you not to drink. You are pregnant, may be pregnant, or are planning to become pregnant. If you drink alcohol: Limit how much you use to: 0-1 drink a day for women. 0-2 drinks a day for men. Know how much alcohol is in your drink. In the [place] one drink equals one 12 oz bottle of beer (355 mL), one 5 oz glass of wine (148 mL), or one 1 oz glass of hard liquor (44 mL). Do not smoke or use any  products that contain nicotine or tobacco. If you need help quitting, ask your doctor. Preventing falls Use tools to help you move around (mobility aids) as needed. These include canes, walkers, scooters, and crutches. Keep rooms well-lit. Put away things on the floor that could make you trip. These include cords and rugs. Install safety rails on stairs. Install grab bars in bathrooms. Use rubber mats in slippery areas, like bathrooms. Wear shoes that: Fit you well. Support your feet. Have closed toes. Have rubber soles or low heels. Tell your doctor about all of the medicines you are taking. Some medicines can make you more likely to fall. General instructions Take over-the-counter and prescription medicines only as told by your doctor. Keep all follow-up visits. Contact a doctor if: You have not been tested (screened) for osteoporosis and you are: A woman who is age 44 or older. A man who is age 73 or older. Get help right away if: You fall. You get hurt. Summary Osteoporosis happens when your bones get thin and weak. Weak bones can break (fracture) more easily. Eat plenty of calcium and vitamin D. These are good for your bones. Tell your doctor about all of the medicines that you take. This information is not intended to replace advice given to you by your health care provider. Make sure you discuss any questions you have with your health care provider. Document Revised: 09/05/2019 Document Reviewed: 09/05/2019 Elsevier Patient Education  [place].

## 2022-02-17 NOTE — Progress Notes (Signed)
Pharmacy Note  Subjective:  Patient presents today to [facility] for follow up office visit. Patient had previous use of Prolia, Forteo, and Fosamax. Stopped Prolia previously due to understanding that she was not needing it anymore (unknown encounter, not found in Care Everywhere). Patient was seen by the pharmacist for counseling on Prolia restart.   Objective: CMP     Component Value Date/Time   NA 141 06/22/2020 0806   NA 142 06/05/2015 0844   K 4.2 06/22/2020 0806   CL 102 06/22/2020 0806   CO2 30 06/22/2020 0806   GLUCOSE 97 06/22/2020 0806   BUN 17 06/22/2020 0806   BUN 12 06/05/2015 0844   CREATININE 0.65 06/22/2020 0806   CALCIUM 9.4 06/22/2020 0806   PROT 6.9 06/22/2020 0806   PROT 6.9 06/05/2015 0844   ALBUMIN 3.7 11/04/2016 0528   ALBUMIN 4.4 06/05/2015 0844   AST 17 06/22/2020 0806   ALT 16 06/22/2020 0806   ALKPHOS 77 11/04/2016 0528   BILITOT 0.6 06/22/2020 0806   BILITOT 0.5 06/05/2015 0844   GFRNONAA 84 06/22/2020 0806   GFRAA 97 06/22/2020 0806    Vitamin D Lab Results  Component Value Date   VD25OH 33 06/08/2017    DEXA scan: DEXA scan ordered to follow up existing osteoporosis.  Assessment/Plan:  Counseled patient on purpose, proper use, and adverse effects of Prolia.  Counseled patient that Prolia is a medication that must be injected every 6 months by a healthcare professional.  Advised patient to take calcium 1200 mg daily and vitamin D 800 units daily.  Reviewed the most common adverse effects of Prolia including risk of infection, osteonecrosis of the jaw, rash, and muscle/bone pain.  Patient confirms she does not have any major dental work nor invasive procedures planned at this time.  Reviewed with patient the signs/symptoms of low calcium and advised patient to alert [place] if she experiences these symptoms.  Provided patient with medication education material and answered all questions. Will apply for Prolia through patient's insurance  and assess most cost-effective option for her.  [facility] PharmD Candidate [phone]

## 2022-02-19 LAB — COMPLETE METABOLIC PANEL WITH GFR
AG Ratio: 1.7 (calc) (ref 1.0–2.5)
ALT: 14 U/L (ref 6–29)
AST: 20 U/L (ref 10–35)
Albumin: 4.3 g/dL (ref 3.6–5.1)
Alkaline phosphatase (APISO): 128 U/L (ref 37–153)
BUN: 17 mg/dL (ref 7–25)
CO2: 31 mmol/L (ref 20–32)
Calcium: 9.8 mg/dL (ref 8.6–10.4)
Chloride: 102 mmol/L (ref 98–110)
Creat: 0.73 mg/dL (ref 0.60–0.95)
Globulin: 2.6 g/dL (calc) (ref 1.9–3.7)
Glucose, Bld: 113 mg/dL — ABNORMAL HIGH (ref 65–99)
Potassium: 4.6 mmol/L (ref 3.5–5.3)
Sodium: 140 mmol/L (ref 135–146)
Total Bilirubin: 0.5 mg/dL (ref 0.2–1.2)
Total Protein: 6.9 g/dL (ref 6.1–8.1)
eGFR: 82 mL/min/{1.73_m2} (ref >=60)

## 2022-02-19 LAB — PHOSPHORUS: Phosphorus: 3.8 mg/dL (ref 2.1–4.3)

## 2022-02-19 LAB — CBC WITH DIFFERENTIAL/PLATELET
Absolute Monocytes: 638 cells/uL (ref 200–950)
Basophils Absolute: 53 cells/uL (ref 0–200)
Basophils Relative: 0.7 %
Eosinophils Absolute: 90 cells/uL (ref 15–500)
Eosinophils Relative: 1.2 %
HCT: 40.5 % (ref 35.0–45.0)
Hemoglobin: 13.5 g/dL (ref 11.7–15.5)
Lymphs Abs: 1230 cells/uL (ref 850–3900)
MCH: 31 pg (ref 27.0–33.0)
MCHC: 33.3 g/dL (ref 32.0–36.0)
MCV: 92.9 fL (ref 80.0–100.0)
MPV: 10.4 fL (ref 7.5–12.5)
Monocytes Relative: 8.5 %
Neutro Abs: 5490 cells/uL (ref 1500–7800)
Neutrophils Relative %: 73.2 %
Platelets: 262 10*3/uL (ref 140–400)
RBC: 4.36 10*6/uL (ref 3.80–5.10)
RDW: 12.9 % (ref 11.0–15.0)
Total Lymphocyte: 16.4 %
WBC: 7.5 10*3/uL (ref 3.8–10.8)

## 2022-02-19 LAB — TSH: TSH: 1.49 mIU/L (ref 0.40–4.50)

## 2022-02-19 LAB — PROTEIN ELECTROPHORESIS, SERUM, WITH REFLEX
Albumin ELP: 4.4 g/dL (ref 3.8–4.8)
Alpha 1: 0.3 g/dL (ref 0.2–0.3)
Alpha 2: 0.7 g/dL (ref 0.5–0.9)
Beta 2: 0.3 g/dL (ref 0.2–0.5)
Beta Globulin: 0.4 g/dL (ref 0.4–0.6)
Gamma Globulin: 0.9 g/dL (ref 0.8–1.7)
Total Protein: 7 g/dL (ref 6.1–8.1)

## 2022-02-19 LAB — VITAMIN D 25 HYDROXY (VIT D DEFICIENCY, FRACTURES): Vit D, 25-Hydroxy: 36 ng/mL (ref 30–100)

## 2022-02-19 LAB — PARATHYROID HORMONE, INTACT (NO CA): PTH: 60 pg/mL (ref 16–77)

## 2022-02-19 NOTE — Progress Notes (Signed)
Glucose is mildly elevated probably not a fasting sample, CBC is normal, TSH is normal, PTH, vitamin D and SPEP are normal.  We will discuss results in detail at the follow-up visit.

## 2022-03-03 ENCOUNTER — Ambulatory Visit (HOSPITAL_BASED_OUTPATIENT_CLINIC_OR_DEPARTMENT_OTHER)
Admission: RE | Admit: 2022-03-03 | Discharge: 2022-03-03 | Disposition: A | Discharge status: Home / Self Care | Payer: Medicare PPO | Source: Ambulatory Visit | Attending: Rheumatology | Admitting: Rheumatology

## 2022-03-03 DIAGNOSIS — R29890 Loss of height: Secondary | ICD-10-CM | POA: Insufficient documentation

## 2022-03-04 ENCOUNTER — Other Ambulatory Visit (HOSPITAL_COMMUNITY): Payer: Self-pay

## 2022-03-04 NOTE — Telephone Encounter (Signed)
Pharmacy benefit: Per test claim, copay through pharmacy benefit for Prolia is $64.  Medical Benefit: [place] for Prolia [phone]) benefits and eligibility. Active as of 04/05/2019. [place] is in-network with her plan. Patient has no coinsurance. Patient has no deductible or out-of-pocket maximum. Per rep, [id number] does require prior authorization. Prior authorization is extended from 05/06/2019 through 04/04/2023. Added in Dr. Arlean Hopping name and [facility] day as site of care.  Auth # [id number] (auto-extended) [place] # [id number]  Phone: [phone]  Rep will fax updated authorization letter to our clinic.  Knox Saliva, PharmD, MPH, BCPS, CPP Clinical Pharmacist (Rheumatology and Pulmonology)

## 2022-03-07 NOTE — Progress Notes (Signed)
X-rays of the lumbar spine show scoliosis, degenerative disc disease and facet joint arthropathy with no previous vertebral fracture.

## 2022-03-07 NOTE — Progress Notes (Signed)
X-rays of the thoracic spine showed mild scoliosis with possible compression fracture of the 2 thoracic vertebrae as read by the radiologist.  I will discuss results at the follow-up visit.

## 2022-03-09 ENCOUNTER — Other Ambulatory Visit: Payer: Self-pay | Admitting: Pharmacist

## 2022-03-09 DIAGNOSIS — M810 Age-related osteoporosis without current pathological fracture: Secondary | ICD-10-CM

## 2022-03-09 NOTE — Telephone Encounter (Signed)
Called patient about Prolia benefits. She states she'd prefer to go to [facility] for infusion. Order placed today with [facility]. Patient provided with phone number to schedule.  Knox Saliva, PharmD, MPH, BCPS, CPP Clinical Pharmacist (Rheumatology and Pulmonology)

## 2022-03-09 NOTE — Progress Notes (Signed)
Patient is restarting  Prolia SQ and due for updated orders. Last dose was on 04/10/2020. Diagnosis: age-related osteoporosis  Dose: '60mg'$  SQ every 6 months  Last Clinic Visit: 02/17/2022 Next Clinic Visit: 03/24/2022  Last Prolia dose: 04/07/2020  Labs: CBC, CMP, Vitamin D on 02/17/2022  Orders placed for Prolia SQ x 1 dose. No premedications required  Called patient and provided with phone number for: [facility] [phone]) Adriana [name]  Will follow-up to ensured scheduled and completed  Knox Saliva, PharmD, MPH, BCPS, CPP Clinical Pharmacist (Rheumatology and Pulmonology)

## 2022-03-10 NOTE — Progress Notes (Deleted)
Office Visit Note  Patient: Adriana [name]             Date of Birth: January 25, 1941           MRN: [id number]             PCP: Margretta Sidle, MD Referring: Margretta Sidle, MD Visit Date: 03/24/2022 Occupation: '@GUAROCC'$ @  Subjective:  No chief complaint on file.   History of Present Illness: Adriana [name] is a 81 y.o. female ***   Activities of Daily Living:  Patient reports morning stiffness for *** {minute/hour:19697}.   Patient {ACTIONS;DENIES/REPORTS:21021675::"Denies"} nocturnal pain.  Difficulty dressing/grooming: {ACTIONS;DENIES/REPORTS:21021675::"Denies"} Difficulty climbing stairs: {ACTIONS;DENIES/REPORTS:21021675::"Denies"} Difficulty getting out of chair: {ACTIONS;DENIES/REPORTS:21021675::"Denies"} Difficulty using hands for taps, buttons, cutlery, and/or writing: {ACTIONS;DENIES/REPORTS:21021675::"Denies"}  No Rheumatology ROS completed.   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 Harlow Asa   CHOLECYSTECTOMY N/A 11/02/2016   Procedure: LAPAROSCOPIC CHOLECYSTECTOMY WITH INTRAOPERATIVE CHOLANGIOGRAM;  Surgeon: Armandina Gemma, MD;  Location: [facility];  Service: General;  Laterality: N/A;   COLONOSCOPY  05/2003   No polyps, hemorrhoids,and diverticulosis    ENDOSCOPIC RETROGRADE CHOLANGIOPANCREATOGRAPHY (ERCP) WITH PROPOFOL N/A 11/04/2016   Procedure: ENDOSCOPIC RETROGRADE CHOLANGIOPANCREATOGRAPHY (ERCP) WITH PROPOFOL;  Surgeon: [name], MD;  Location: [facility];  Service: Gastroenterology;  Laterality: N/A;   EYE SURGERY     bilateral cataracts   HERNIA REPAIR  01/2022   Social History   Social History Narrative   Dr.Stoneburner, Ophthalmologist   Dr.Peterson, Urologist   Dr.Lomax, Dermatologist     Immunization History  Administered Date(s) Administered   Influenza Split 01/30/2012   Influenza, High Dose Seasonal PF 02/09/2017, 12/11/2017, 01/02/2019, 01/02/2020   Influenza, Seasonal, Injecte, Preservative Fre 01/03/2015   Influenza,inj,Quad PF,6+ Mos 01/08/2013, 12/08/2015   Influenza-Unspecified 01/03/2014   PFIZER(Purple Top)SARS-COV-2 Vaccination 05/31/2019, 06/25/2019, 01/10/2020   Pneumococcal Conjugate-13 06/06/2014   Pneumococcal Polysaccharide-23 06/02/2005   Td 04/04/2002   Tdap 12/14/2013   Zoster, Live 04/04/2009     Objective: Vital Signs: There were no vitals taken for this visit.   Physical Exam   Musculoskeletal Exam: ***  CDAI Exam: CDAI Score: -- Patient Global: --; Provider Global: -- Swollen: --; Tender: -- Joint Exam 03/24/2022   No joint exam has been documented for this visit   There is currently no  information documented on the homunculus. Go to the Rheumatology activity and complete the homunculus joint exam.  Investigation: No additional findings.  Imaging: DG Lumbar Spine 2-3 Views  Result Date: 03/03/2022 CLINICAL DATA:  Height loss, Vertebral  fracture screening. EXAM: LUMBAR SPINE - 2-3 VIEW COMPARISON:  None Available. FINDINGS: Levoconvex curvature of the lumbar spine. There is no evidence of lumbar spine fracture. There is moderate degenerative disc disease at L1-L2 and L2-L3 and severe degenerative disc disease at L5-S1. There is moderate lower lumbar predominant facet arthropathy. Bilateral SI joint degenerative change. Degenerative grade 1 anterolisthesis at L4-L5. IMPRESSION: Levoconvex curvature of the lumbar spine. No evidence of lumbar spine fracture. Moderate degenerative disc disease at L1-L2 and L2-L3 and severe degenerative disc disease at L5-S1. Moderate lower lumbar predominant facet arthropathy. Degenerative grade 1 anterolisthesis at L4-L5. Electronically Signed   By: Maurine Simmering M.D.   On: 03/03/2022 15:25   DG Thoracic Spine 2 View  Result Date: 03/03/2022 CLINICAL DATA:  Height loss, Vertebral fracture screening. EXAM: THORACIC SPINE 2 VIEWS COMPARISON:  None Available. FINDINGS: There is mild dextroconvex curvature of the lower thoracic spine. There are possible central compression deformities of 2 lower thoracic vertebrae, with up to 10% height loss. There are mild multilevel degenerative changes. Osteopenia. IMPRESSION: Mild dextroconvex curvature of the lower thoracic spine, with possible compression deformities of 2 lower thoracic vertebrae, with up to 10% height loss. Mild multilevel degenerative changes. Electronically Signed   By: Maurine Simmering M.D.   On: 03/03/2022 15:23    Recent Labs: Lab Results  Component Value Date   WBC 7.5 02/17/2022   HGB 13.5 02/17/2022   PLT 262 02/17/2022   NA 140 02/17/2022   K 4.6 02/17/2022   CL 102 02/17/2022   CO2 31 02/17/2022   GLUCOSE 113 (H) 02/17/2022   BUN 17 02/17/2022   CREATININE 0.73 02/17/2022   BILITOT 0.5 02/17/2022   ALKPHOS 77 11/04/2016   AST 20 02/17/2022   ALT 14 02/17/2022   PROT 6.9 02/17/2022   PROT 7.0 02/17/2022   ALBUMIN 3.7 11/04/2016    CALCIUM 9.8 02/17/2022   GFRAA 97 06/22/2020    02/17/2022 SPEP normal, TSH normal, PTH normal, vitamin D 36, phosphorus normal  Speciality Comments: Prior therapy: Bisphosphonates, Forteo: 18 months completed, and then transitioned to Prolia (about 5 years ago).  Last [id number]  Procedures:  No procedures performed Allergies: Chlorhexidine, Adhesive [tape], and Latex   Assessment / Plan:     Visit Diagnoses: No diagnosis found.  Orders: No orders of the defined types were placed in this encounter.  No orders of the defined types were placed in this encounter.   Face-to-face time spent with patient was *** minutes. Greater than 50% of time was spent in counseling and coordination of care.  Follow-Up Instructions: No follow-ups on file.   Bo Merino, MD  Note - This record has been created using Editor, commissioning.  Chart creation errors have been sought, but may not always  have been located. Such creation errors do not reflect on  the standard of medical care.

## 2022-03-11 NOTE — Progress Notes (Signed)
Prolia scheduled for 03/14/2022. Will f/u to ensure completed  Knox Saliva, PharmD, MPH, BCPS, CPP Clinical Pharmacist (Rheumatology and Pulmonology)

## 2022-03-14 ENCOUNTER — Encounter (HOSPITAL_COMMUNITY)
Admission: RE | Admit: 2022-03-14 | Discharge: 2022-03-14 | Disposition: A | Discharge status: Home / Self Care | Payer: Medicare PPO | Source: Ambulatory Visit | Attending: Rheumatology | Admitting: Rheumatology

## 2022-03-14 DIAGNOSIS — M810 Age-related osteoporosis without current pathological fracture: Secondary | ICD-10-CM | POA: Insufficient documentation

## 2022-03-14 MED ORDER — DENOSUMAB 60 MG/ML ~~LOC~~ SOSY
60.0000 mg | PREFILLED_SYRINGE | Freq: Once | SUBCUTANEOUS | Status: AC
  Administered 2022-03-14: 60 mg via SUBCUTANEOUS

## 2022-03-14 MED ORDER — DENOSUMAB 60 MG/ML ~~LOC~~ SOSY
PREFILLED_SYRINGE | SUBCUTANEOUS | Status: AC
  Filled 2022-03-14: qty 1

## 2022-03-21 NOTE — Progress Notes (Signed)
Office Visit Note  Patient: Adriana [name]             Date of Birth: [birth year]           MRN: [id number]             PCP: Margretta Sidle, MD Referring: Margretta Sidle, MD Visit Date: 03/23/2022 Occupation: '@GUAROCC'$ @  Subjective:  No chief complaint on file.   History of Present Illness: Adriana [name] is a 81 y.o. female ***     Activities of Daily Living:  Patient reports morning stiffness for *** {minute/hour:19697}.   Patient {ACTIONS;DENIES/REPORTS:21021675::"Denies"} nocturnal pain.  Difficulty dressing/grooming: {ACTIONS;DENIES/REPORTS:21021675::"Denies"} Difficulty climbing stairs: {ACTIONS;DENIES/REPORTS:21021675::"Denies"} Difficulty getting out of chair: {ACTIONS;DENIES/REPORTS:21021675::"Denies"} Difficulty using hands for taps, buttons, cutlery, and/or writing: {ACTIONS;DENIES/REPORTS:21021675::"Denies"}  No Rheumatology ROS completed.   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   Vegetarian diet 06/14/2015   GERD (gastroesophageal reflux disease)    Senile osteoporosis 02/08/2013    Past Medical History:  Diagnosis Date   Cancer ([place])    squamous cell face removed   Cellulitis, toe    osteomylitis   Cholelithiasis    Diverticulitis    Heart murmur    mitral valve proplase    Hiatal hernia    hx of 25 years ago   Hyperlipidemia    Mitral valve prolapse    Osteoporosis    PONV (postoperative nausea and vomiting)     Family History  Problem Relation Age of Onset   Cerebrovascular Accident Mother 40   Stroke Mother    Pneumonia Father 70   Breast cancer Sister    Healthy Son    Healthy Son    Past Surgical History:   Procedure Laterality Date   CHOLECYSTECTOMY     11/02/16 Dr. Harlow Asa   CHOLECYSTECTOMY N/A 11/02/2016   Procedure: LAPAROSCOPIC CHOLECYSTECTOMY WITH INTRAOPERATIVE CHOLANGIOGRAM;  Surgeon: Armandina Gemma, MD;  Location: [facility];  Service: General;  Laterality: N/A;   COLONOSCOPY  05/2003   No polyps, hemorrhoids,and diverticulosis    ENDOSCOPIC RETROGRADE CHOLANGIOPANCREATOGRAPHY (ERCP) WITH PROPOFOL N/A 11/04/2016   Procedure: ENDOSCOPIC RETROGRADE CHOLANGIOPANCREATOGRAPHY (ERCP) WITH PROPOFOL;  Surgeon: [name], MD;  Location: [facility];  Service: Gastroenterology;  Laterality: N/A;   EYE SURGERY     bilateral cataracts   HERNIA REPAIR  01/2022   Social History   Social History Narrative   Dr.Stoneburner, Ophthalmologist   Dr.Peterson, Urologist   Dr.Lomax, Dermatologist     Immunization History  Administered Date(s) Administered   Influenza Split 01/30/2012   Influenza, High Dose Seasonal PF 02/09/2017, 12/11/2017, 01/02/2019, 01/02/2020   Influenza, Seasonal, Injecte, Preservative Fre 01/03/2015   Influenza,inj,Quad PF,6+ Mos 01/08/2013, 12/08/2015   Influenza-Unspecified 01/03/2014   PFIZER(Purple Top)SARS-COV-2 Vaccination 05/31/2019, 06/25/2019, 01/10/2020   Pneumococcal Conjugate-13 06/06/2014   Pneumococcal Polysaccharide-23 06/02/2005   Td 04/04/2002   Tdap 12/14/2013   Zoster, Live 04/04/2009     Objective: Vital Signs: There were no vitals taken for this visit.   Physical Exam   Musculoskeletal Exam: ***  CDAI Exam: CDAI Score: -- Patient Global: --; Provider Global: -- Swollen: --; Tender: -- Joint Exam 03/23/2022   No joint exam has been documented for this visit   There is  currently no information documented on the homunculus. Go to the Rheumatology activity and complete the homunculus joint exam.  Investigation: No additional findings.  Imaging: DG Lumbar Spine 2-3 Views  Result Date: 03/03/2022 CLINICAL DATA:  Height loss, Vertebral  fracture screening. EXAM: LUMBAR SPINE - 2-3 VIEW COMPARISON:  None Available. FINDINGS: Levoconvex curvature of the lumbar spine. There is no evidence of lumbar spine fracture. There is moderate degenerative disc disease at L1-L2 and L2-L3 and severe degenerative disc disease at L5-S1. There is moderate lower lumbar predominant facet arthropathy. Bilateral SI joint degenerative change. Degenerative grade 1 anterolisthesis at L4-L5. IMPRESSION: Levoconvex curvature of the lumbar spine. No evidence of lumbar spine fracture. Moderate degenerative disc disease at L1-L2 and L2-L3 and severe degenerative disc disease at L5-S1. Moderate lower lumbar predominant facet arthropathy. Degenerative grade 1 anterolisthesis at L4-L5. Electronically Signed   By: Maurine Simmering M.D.   On: 03/03/2022 15:25   DG Thoracic Spine 2 View  Result Date: 03/03/2022 CLINICAL DATA:  Height loss, Vertebral fracture screening. EXAM: THORACIC SPINE 2 VIEWS COMPARISON:  None Available. FINDINGS: There is mild dextroconvex curvature of the lower thoracic spine. There are possible central compression deformities of 2 lower thoracic vertebrae, with up to 10% height loss. There are mild multilevel degenerative changes. Osteopenia. IMPRESSION: Mild dextroconvex curvature of the lower thoracic spine, with possible compression deformities of 2 lower thoracic vertebrae, with up to 10% height loss. Mild multilevel degenerative changes. Electronically Signed   By: Maurine Simmering M.D.   On: 03/03/2022 15:23    Recent Labs: Lab Results  Component Value Date   WBC 7.5 02/17/2022   HGB 13.5 02/17/2022   PLT 262 02/17/2022   NA 140 02/17/2022   K 4.6 02/17/2022   CL 102 02/17/2022   CO2 31 02/17/2022   GLUCOSE 113 (H) 02/17/2022   BUN 17 02/17/2022   CREATININE 0.73 02/17/2022   BILITOT 0.5 02/17/2022   ALKPHOS 77 11/04/2016   AST 20 02/17/2022   ALT 14 02/17/2022   PROT 6.9 02/17/2022   PROT 7.0 02/17/2022   ALBUMIN 3.7 11/04/2016    CALCIUM 9.8 02/17/2022   GFRAA 97 06/22/2020   09/10/18 T score -3.6, BMD 0.655 AP spine, 18% improvement. Repeat DEXA pending  02/17/22 SPEP normal, TSH normal, PTH normal. Vit D 36, Phosphorus 3.8  Speciality Comments: Prior therapy: Bisphosphonates, Forteo: 18 months completed, and then transitioned to Prolia (about 5 years ago).  Last Prolia 01/22 restarted 03/14/22.  Procedures:  No procedures performed Allergies: Chlorhexidine, Adhesive [tape], and Latex   Assessment / Plan:     Visit Diagnoses: Age-related osteoporosis without current pathological fracture - Bisphosphonates-many years, Forteo: 18 months completed, and then transitioned to Prolia (about 5 years ago).  Last Prolia01/07/22. Prolia restated 03/14/22.  Height loss - two possible old compresiion fractures in the thoracic region.  DDD (degenerative disc disease), lumbar  Gastroesophageal reflux disease without esophagitis  Vegetarian diet  Hyperglycemia  Calculus of gallbladder without cholecystitis without obstruction  Divergence insufficiency  Orders: No orders of the defined types were placed in this encounter.  No orders of the defined types were placed in this encounter.   Face-to-face time spent with patient was *** minutes. Greater than 50% of time was spent in counseling and coordination of care.  Follow-Up Instructions: No follow-ups on file.   Bo Merino, MD  Note - This record has been created using Editor, commissioning.  Chart creation errors have been sought, but may not always  have  been located. Such creation errors do not reflect on  the standard of medical care.

## 2022-03-23 ENCOUNTER — Encounter: Payer: Self-pay | Admitting: Rheumatology

## 2022-03-23 ENCOUNTER — Ambulatory Visit: Payer: Medicare PPO | Attending: Rheumatology | Admitting: Rheumatology

## 2022-03-23 VITALS — BP 166/91 | HR 91 | Resp 14 | Ht 62.5 in | Wt 107.4 lb

## 2022-03-23 DIAGNOSIS — M5136 Other intervertebral disc degeneration, lumbar region: Secondary | ICD-10-CM | POA: Diagnosis not present

## 2022-03-23 DIAGNOSIS — M810 Age-related osteoporosis without current pathological fracture: Secondary | ICD-10-CM

## 2022-03-23 DIAGNOSIS — K8020 Calculus of gallbladder without cholecystitis without obstruction: Secondary | ICD-10-CM

## 2022-03-23 DIAGNOSIS — Z789 Other specified health status: Secondary | ICD-10-CM | POA: Diagnosis not present

## 2022-03-23 DIAGNOSIS — R739 Hyperglycemia, unspecified: Secondary | ICD-10-CM

## 2022-03-23 DIAGNOSIS — H518 Other specified disorders of binocular movement: Secondary | ICD-10-CM

## 2022-03-23 DIAGNOSIS — R29890 Loss of height: Secondary | ICD-10-CM | POA: Diagnosis not present

## 2022-03-23 DIAGNOSIS — K219 Gastro-esophageal reflux disease without esophagitis: Secondary | ICD-10-CM

## 2022-03-23 NOTE — Patient Instructions (Signed)

## 2022-03-24 ENCOUNTER — Ambulatory Visit: Payer: Medicare PPO | Admitting: Rheumatology

## 2022-03-24 DIAGNOSIS — H518 Other specified disorders of binocular movement: Secondary | ICD-10-CM

## 2022-03-24 DIAGNOSIS — K8020 Calculus of gallbladder without cholecystitis without obstruction: Secondary | ICD-10-CM

## 2022-03-24 DIAGNOSIS — M810 Age-related osteoporosis without current pathological fracture: Secondary | ICD-10-CM

## 2022-03-24 DIAGNOSIS — K219 Gastro-esophageal reflux disease without esophagitis: Secondary | ICD-10-CM

## 2022-03-24 DIAGNOSIS — R739 Hyperglycemia, unspecified: Secondary | ICD-10-CM

## 2022-03-24 DIAGNOSIS — R29890 Loss of height: Secondary | ICD-10-CM

## 2022-03-24 DIAGNOSIS — Z789 Other specified health status: Secondary | ICD-10-CM

## 2022-03-24 DIAGNOSIS — R5383 Other fatigue: Secondary | ICD-10-CM

## 2022-09-13 ENCOUNTER — Telehealth: Payer: Self-pay | Admitting: Pharmacist

## 2022-09-13 DIAGNOSIS — M810 Age-related osteoporosis without current pathological fracture: Secondary | ICD-10-CM

## 2022-09-13 DIAGNOSIS — Z5181 Encounter for therapeutic drug level monitoring: Secondary | ICD-10-CM

## 2022-09-13 NOTE — Telephone Encounter (Signed)
Patient due for next Prolia on 09/10/22. Pre-certification for Prolia is active through [facility] until 04/04/2023. ATC patient to discuss. She will need updated labs (CBC and CMP). Future order placed.  VM left with patient.  Chesley Mires, PharmD, MPH, BCPS, CPP Clinical Pharmacist (Rheumatology and Pulmonology)

## 2022-09-14 ENCOUNTER — Other Ambulatory Visit: Payer: Self-pay | Admitting: *Deleted

## 2022-09-14 DIAGNOSIS — M810 Age-related osteoporosis without current pathological fracture: Secondary | ICD-10-CM

## 2022-09-14 DIAGNOSIS — Z5181 Encounter for therapeutic drug level monitoring: Secondary | ICD-10-CM

## 2022-09-14 NOTE — Telephone Encounter (Signed)
Patient contacted the office and states she will come to the office today and get her labs done.

## 2022-09-15 ENCOUNTER — Other Ambulatory Visit: Payer: Self-pay | Admitting: Pharmacist

## 2022-09-15 DIAGNOSIS — M810 Age-related osteoporosis without current pathological fracture: Secondary | ICD-10-CM

## 2022-09-15 LAB — COMPREHENSIVE METABOLIC PANEL
AG Ratio: 1.8 (calc) (ref 1.0–2.5)
ALT: 13 U/L (ref 6–29)
AST: 16 U/L (ref 10–35)
Albumin: 4.5 g/dL (ref 3.6–5.1)
Alkaline phosphatase (APISO): 64 U/L (ref 37–153)
BUN: 23 mg/dL (ref 7–25)
CO2: 31 mmol/L (ref 20–32)
Calcium: 9.5 mg/dL (ref 8.6–10.4)
Chloride: 101 mmol/L (ref 98–110)
Creat: 0.68 mg/dL (ref 0.60–0.95)
Globulin: 2.5 g/dL (calc) (ref 1.9–3.7)
Glucose, Bld: 162 mg/dL — ABNORMAL HIGH (ref 65–99)
Potassium: 4 mmol/L (ref 3.5–5.3)
Sodium: 140 mmol/L (ref 135–146)
Total Bilirubin: 0.5 mg/dL (ref 0.2–1.2)
Total Protein: 7 g/dL (ref 6.1–8.1)

## 2022-09-15 LAB — CBC WITH DIFFERENTIAL/PLATELET
Absolute Monocytes: 476 cells/uL (ref 200–950)
Basophils Absolute: 43 cells/uL (ref 0–200)
Basophils Relative: 0.5 %
Eosinophils Absolute: 60 cells/uL (ref 15–500)
Eosinophils Relative: 0.7 %
HCT: 41.8 % (ref 35.0–45.0)
Hemoglobin: 13.6 g/dL (ref 11.7–15.5)
Lymphs Abs: 1887 cells/uL (ref 850–3900)
MCH: 30.4 pg (ref 27.0–33.0)
MCHC: 32.5 g/dL (ref 32.0–36.0)
MCV: 93.5 fL (ref 80.0–100.0)
MPV: 10.3 fL (ref 7.5–12.5)
Monocytes Relative: 5.6 %
Neutro Abs: 6035 cells/uL (ref 1500–7800)
Neutrophils Relative %: 71 %
Platelets: 293 10*3/uL (ref 140–400)
RBC: 4.47 10*6/uL (ref 3.80–5.10)
RDW: 12.4 % (ref 11.0–15.0)
Total Lymphocyte: 22.2 %
WBC: 8.5 10*3/uL (ref 3.8–10.8)

## 2022-09-15 NOTE — Progress Notes (Signed)
Next Prolia SQ due on 09/10/2022. Diagnosis: age-related osteoporosis  Dose: 60 mg SQ every 6 months  Last Clinic Visit: 03/23/2022 Next Clinic Visit: 10/12/2022  Last Prolia dose: 03/13/2022  Labs: 09/14/22 CBC CMP wnl to proceed Last DEXA: 09/10/2018 Next DEXA due: June 2024 (ordered)  Orders placed for Prolia x 1 dose. No premedicatons required.   Called patient and provided with phone number for: [facility] [phone]) Ginette Otto  Will follow-up to ensured scheduled and completed  Chesley Mires, PharmD, MPH, BCPS, CPP Clinical Pharmacist (Rheumatology and Pulmonology)

## 2022-09-15 NOTE — Progress Notes (Signed)
CMP is normal except glucose is elevated at 162.  Please forward results to her PCP.

## 2022-09-15 NOTE — Telephone Encounter (Signed)
Labs wnl to proceed with Prolia. Order placed w [facility]  Chesley Mires, PharmD, MPH, BCPS, CPP Clinical Pharmacist (Rheumatology and Pulmonology)

## 2022-09-16 NOTE — Progress Notes (Signed)
Prolia scheduled for 09/22/22. Will f/u to ensure completed  Chesley Mires, PharmD, MPH, BCPS, CPP Clinical Pharmacist (Rheumatology and Pulmonology)

## 2022-09-22 ENCOUNTER — Ambulatory Visit (HOSPITAL_COMMUNITY)
Admission: RE | Admit: 2022-09-22 | Discharge: 2022-09-22 | Disposition: A | Discharge status: Home / Self Care | Payer: Medicare PPO | Source: Ambulatory Visit | Attending: Rheumatology | Admitting: Rheumatology

## 2022-09-22 ENCOUNTER — Ambulatory Visit: Payer: Medicare PPO | Admitting: Rheumatology

## 2022-09-22 DIAGNOSIS — M810 Age-related osteoporosis without current pathological fracture: Secondary | ICD-10-CM | POA: Diagnosis present

## 2022-09-22 MED ORDER — DENOSUMAB 60 MG/ML ~~LOC~~ SOSY
60.0000 mg | PREFILLED_SYRINGE | Freq: Once | SUBCUTANEOUS | Status: AC
  Administered 2022-09-22: 60 mg via SUBCUTANEOUS

## 2022-09-22 MED ORDER — DENOSUMAB 60 MG/ML ~~LOC~~ SOSY
PREFILLED_SYRINGE | SUBCUTANEOUS | Status: AC
  Filled 2022-09-22: qty 1

## 2022-09-28 NOTE — Progress Notes (Signed)
Office Visit Note  Patient: Adriana [name]             Date of Birth: 1940-07-13           MRN: [id number]             PCP: Adriana Olszewski, MD Referring: Adriana Olszewski, MD Visit Date: 10/12/2022 Occupation: @GUAROCC @  Subjective:  Medication management  History of Present Illness: Adriana [name] is a 82 y.o. female with osteoporosis and osteoarthritis.  She states she had her last Prolia injection on September 22, 2022.  She had no side effects from Prolia.  She has been taking calcium and vitamin D.  She is also walking some for exercise.  She has not joined the [facility] yet.  She denies any joint pain or joint swelling.    Activities of Daily Living:  Patient reports morning stiffness for 0 minutes.   Patient Denies nocturnal pain.  Difficulty dressing/grooming: Denies Difficulty climbing stairs: Denies Difficulty getting out of chair: Denies Difficulty using hands for taps, buttons, cutlery, and/or writing: Denies  Review of Systems  Constitutional:  Positive for fatigue.  HENT:  Negative for mouth sores and mouth dryness.   Eyes:  Positive for dryness.  Respiratory:  Negative for shortness of breath.   Cardiovascular:  Negative for chest pain and palpitations.  Gastrointestinal:  Negative for blood in stool, constipation and diarrhea.  Endocrine: Negative for increased urination.  Genitourinary:  Negative for involuntary urination.  Musculoskeletal:  Positive for myalgias, muscle weakness and myalgias. Negative for joint pain, gait problem, joint pain, joint swelling, morning stiffness and muscle tenderness.  Skin:  Positive for sensitivity to sunlight. Negative for color change, rash and hair loss.  Allergic/Immunologic: Negative for susceptible to infections.  Neurological:  Negative for dizziness and headaches.  Hematological:  Negative for swollen glands.  Psychiatric/Behavioral:  Negative for depressed mood and sleep disturbance. The patient is not nervous/anxious.      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   Vegetarian diet 06/14/2015   GERD (gastroesophageal reflux disease)    Senile osteoporosis 02/08/2013    Past Medical History:  Diagnosis Date   Cancer (HCC)    squamous cell face removed   Cellulitis, toe    osteomylitis   Cholelithiasis    Diverticulitis    Heart murmur    mitral valve proplase    Hiatal hernia    hx of 25 years ago   Hyperlipidemia    Mitral valve prolapse    Osteoporosis    PONV (postoperative nausea and vomiting)     Family History  Problem Relation Age of Onset   Cerebrovascular Accident Mother 58   Stroke Mother    Pneumonia Father 72   Breast cancer Sister    Healthy Son    Healthy Son    Past Surgical History:  Procedure Laterality Date   CHOLECYSTECTOMY     11/02/16 Dr. Gerrit Friends   CHOLECYSTECTOMY N/A 11/02/2016   Procedure: LAPAROSCOPIC CHOLECYSTECTOMY WITH INTRAOPERATIVE CHOLANGIOGRAM;  Surgeon: Darnell Level, MD;  Location: [facility];  Service: General;  Laterality: N/A;   COLONOSCOPY  05/2003   No polyps, hemorrhoids,and diverticulosis    ENDOSCOPIC RETROGRADE CHOLANGIOPANCREATOGRAPHY (ERCP) WITH PROPOFOL  N/A 11/04/2016   Procedure: ENDOSCOPIC RETROGRADE CHOLANGIOPANCREATOGRAPHY (ERCP) WITH PROPOFOL;  Surgeon: Kerin Salen, MD;  Location: [facility];  Service: Gastroenterology;  Laterality: N/A;   EYE SURGERY     bilateral cataracts   HERNIA REPAIR  01/2022   Social History   Social History Narrative   Dr.Stoneburner, Ophthalmologist   Dr.Peterson, Urologist   Dr.Lomax, Dermatologist     Immunization History  Administered Date(s) Administered   Influenza Split  01/30/2012   Influenza, High Dose Seasonal PF 02/09/2017, 12/11/2017, 01/02/2019, 01/02/2020   Influenza, Seasonal, Injecte, Preservative Fre 01/03/2015   Influenza,inj,Quad PF,6+ Mos 01/08/2013, 12/08/2015   Influenza-Unspecified 01/03/2014   PFIZER(Purple Top)SARS-COV-2 Vaccination 05/31/2019, 06/25/2019, 01/10/2020   Pneumococcal Conjugate-13 06/06/2014   Pneumococcal Polysaccharide-23 06/02/2005   Td 04/04/2002   Tdap 12/14/2013   Zoster, Live 04/04/2009     Objective: Vital Signs: BP (!) 151/75 (BP Location: Right Arm, Patient Position: Sitting, Cuff Size: Normal)   Pulse 93   Resp 14   Ht 5' 2.75" (1.594 m)   Wt 102 lb 6.4 oz (46.4 kg)   BMI 18.28 kg/m    Physical Exam Vitals and nursing note reviewed.  Constitutional:      Appearance: She is well-developed.  HENT:     Head: Normocephalic and atraumatic.  Eyes:     Conjunctiva/sclera: Conjunctivae normal.  Cardiovascular:     Rate and Rhythm: Normal rate and regular rhythm.     Heart sounds: Normal heart sounds.  Pulmonary:     Effort: Pulmonary effort is normal.     Breath sounds: Normal breath sounds.  Abdominal:     General: Bowel sounds are normal.     Palpations: Abdomen is soft.  Musculoskeletal:     Cervical back: Normal range of motion.  Lymphadenopathy:     Cervical: No cervical adenopathy.  Skin:    General: Skin is warm and dry.     Capillary Refill: Capillary refill takes less than 2 seconds.  Neurological:     Mental Status: She is alert and oriented to person, place, and time.  Psychiatric:        Behavior: Behavior normal.      Musculoskeletal Exam: Cervical spine was in good range of motion.  She had good range of motion of thoracic and lumbar spine without any tenderness.  Shoulder joints, elbow joints, wrist joints in good range of motion.  There was no tenderness over MCPs PIPs or DIPs.  Hip joints and knee joints were in good range of motion without any warmth swelling or effusion.  There  was no tenderness over ankles or MTPs.  CDAI Exam: CDAI Score: -- Patient Global: --; Provider Global: -- Swollen: --; Tender: -- Joint Exam 10/12/2022   No joint exam has been documented for this visit   There is currently no information documented on the homunculus. Go to the Rheumatology activity and complete the homunculus joint exam.  Investigation: No additional findings.  Imaging: No results found.  Recent Labs: Lab Results  Component Value Date   WBC 8.5 09/14/2022   HGB 13.6 09/14/2022   PLT 293 09/14/2022   NA 140 09/14/2022   K 4.0 09/14/2022   CL 101 09/14/2022   CO2 31 09/14/2022   GLUCOSE 162 (H) 09/14/2022   BUN 23 09/14/2022   CREATININE 0.68 09/14/2022   BILITOT 0.5 09/14/2022   ALKPHOS 77 11/04/2016   AST 16 09/14/2022   ALT 13 09/14/2022   PROT 7.0 09/14/2022   ALBUMIN 3.7 11/04/2016  CALCIUM 9.5 09/14/2022   GFRAA 97 06/22/2020    Speciality Comments: Prior therapy: Bisphosphonates, Forteo: 18 months completed, and then transitioned to Prolia (about 5 years ago).  Last Prolia 01/22 restarted March 14, 2022  Procedures:  No procedures performed Allergies: Chlorhexidine, Adhesive [tape], and Latex   Assessment / Plan:     Visit Diagnoses: Age-related osteoporosis without current pathological fracture -September 10, 2018 T-score -3.6, BMD 0.655 AP spine.  She had bisphosphonates-many years, Forteo: 18 months completed, and then transitioned to Prolia (about 5 years ago).  Patient states she had a gap of 2 years and Prolia as she could not find a physician to give her the medication.  Prolia was restarted on March 14, 2022.  Last Prolia: 09/22/2022.  She has been tolerating Prolia without any side effects.  Use of calcium rich diet and vitamin D was advised.  Return for regular exercise was discussed.  Height loss - x-rays of the thoracic spine were obtained showed two possible old compresiion fractures in the thoracic region.  DDD (degenerative  disc disease), lumbar -she denies any discomfort in her lumbar spine today.  She had no point tenderness.  X-rays of the lumbar spine showed degenerative changes but no acute fracture.  Gastroesophageal reflux disease without esophagitis  Vegetarian diet-use of vitamin B-12 and vitamin D was discussed.  Hyperglycemia  Calculus of gallbladder without cholecystitis without obstruction  Divergence insufficiency  Orders: No orders of the defined types were placed in this encounter.  No orders of the defined types were placed in this encounter.    Follow-Up Instructions: Return in about 4 months (around 02/12/2023) for Osteoarthritis, Osteoporosis.   Pollyann Savoy, MD  Note - This record has been created using Animal nutritionist.  Chart creation errors have been sought, but may not always  have been located. Such creation errors do not reflect on  the standard of medical care.

## 2022-10-12 ENCOUNTER — Ambulatory Visit: Payer: Medicare PPO | Attending: Rheumatology | Admitting: Rheumatology

## 2022-10-12 ENCOUNTER — Encounter: Payer: Self-pay | Admitting: Rheumatology

## 2022-10-12 VITALS — BP 151/75 | HR 93 | Resp 14 | Ht 62.75 in | Wt 102.4 lb

## 2022-10-12 DIAGNOSIS — K219 Gastro-esophageal reflux disease without esophagitis: Secondary | ICD-10-CM | POA: Diagnosis not present

## 2022-10-12 DIAGNOSIS — R739 Hyperglycemia, unspecified: Secondary | ICD-10-CM

## 2022-10-12 DIAGNOSIS — R29890 Loss of height: Secondary | ICD-10-CM

## 2022-10-12 DIAGNOSIS — K8020 Calculus of gallbladder without cholecystitis without obstruction: Secondary | ICD-10-CM

## 2022-10-12 DIAGNOSIS — Z789 Other specified health status: Secondary | ICD-10-CM

## 2022-10-12 DIAGNOSIS — H518 Other specified disorders of binocular movement: Secondary | ICD-10-CM

## 2022-10-12 DIAGNOSIS — M5136 Other intervertebral disc degeneration, lumbar region: Secondary | ICD-10-CM

## 2022-10-12 DIAGNOSIS — M810 Age-related osteoporosis without current pathological fracture: Secondary | ICD-10-CM | POA: Diagnosis not present

## 2022-10-12 NOTE — Patient Instructions (Addendum)
Please call [facility] to schedule the bone density scan: [phone]

## 2022-10-18 ENCOUNTER — Telehealth: Payer: Self-pay | Admitting: *Deleted

## 2022-10-18 NOTE — Telephone Encounter (Signed)
Received DEXA results from [facility].  Date of Scan: 05/31/2021  Lowest T-score:-2.9  BMD:0.531  Lowest site measured:Right Femoral Neck  DX: Osteoporosis  Significant changes in BMD and site measured (5% and above):11% AP Spine  Current Regimen:Prolia Last injection 09/22/2022 (restarted 03/14/2022)  Recommendation:Continue current treatment.   Reviewed by:Sherron Ales, PA-C  Next Appointment:  02/16/2023  Patient advised to continue current treatment.

## 2022-10-27 ENCOUNTER — Encounter: Payer: Self-pay | Admitting: Family Medicine

## 2023-01-31 NOTE — Progress Notes (Signed)
Office Visit Note  Patient: Adriana [name]             Date of Birth: 1940/12/03           MRN: [id number]             PCP: Lula Olszewski, MD Referring: Lula Olszewski, MD Visit Date: 02/13/2023 Occupation: @GUAROCC @  Subjective:  Medication management  History of Present Illness: Adriana [name] is a 82 y.o. female with osteoporosis and osteoarthritis.  She restarted Prolia injections on March 14, 2022.  Her last Prolia injection was on September 30, 2022.  She denies any side effects from the Prolia.  She has been taking calcium and vitamin D.  She is also exercising on a regular basis.  She has intermittent lower back pain which she denies today.    Activities of Daily Living:  Patient reports morning stiffness for 0 minutes.   Patient Denies nocturnal pain.  Difficulty dressing/grooming: Denies Difficulty climbing stairs: Denies Difficulty getting out of chair: Denies Difficulty using hands for taps, buttons, cutlery, and/or writing: Denies  Review of Systems  Constitutional:  Negative for fatigue.  HENT:  Negative for mouth sores and mouth dryness.   Eyes:  Positive for dryness.  Respiratory:  Negative for shortness of breath.   Cardiovascular:  Negative for chest pain and palpitations.  Gastrointestinal:  Negative for blood in stool, constipation and diarrhea.  Endocrine: Positive for increased urination.  Genitourinary:  Negative for painful urination and involuntary urination.  Musculoskeletal:  Positive for myalgias, muscle weakness and myalgias. Negative for joint pain, gait problem, joint pain, joint swelling, morning stiffness and muscle tenderness.  Skin:  Negative for color change, rash, hair loss and sensitivity to sunlight.  Allergic/Immunologic: Negative for susceptible to infections.  Neurological:  Negative for dizziness and headaches.  Hematological:  Negative for swollen glands.  Psychiatric/Behavioral:  Negative for depressed mood and sleep disturbance.  The patient is not nervous/anxious.     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   Vegetarian diet 06/14/2015   GERD (gastroesophageal reflux disease)    Senile osteoporosis 02/08/2013    Past Medical History:  Diagnosis Date   Cancer (HCC)    squamous cell face removed   Cellulitis, toe    osteomylitis   Cholelithiasis    Diverticulitis    Heart murmur    mitral valve proplase    Hiatal hernia    hx of 25 years ago   Hyperlipidemia    Mitral valve prolapse    Osteoporosis    PONV (postoperative nausea and vomiting)     Family History  Problem Relation Age of Onset   Cerebrovascular Accident Mother 56   Stroke Mother    Pneumonia Father 30   Breast cancer Sister    Healthy Son    Healthy Son    Past Surgical History:  Procedure Laterality Date   CHOLECYSTECTOMY     11/02/16 Dr. Gerrit Friends   CHOLECYSTECTOMY N/A 11/02/2016   Procedure: LAPAROSCOPIC CHOLECYSTECTOMY WITH INTRAOPERATIVE CHOLANGIOGRAM;  Surgeon: Darnell Level, MD;  Location: [facility];  Service: General;  Laterality: N/A;   COLONOSCOPY  05/2003   No polyps, hemorrhoids,and diverticulosis    ENDOSCOPIC RETROGRADE CHOLANGIOPANCREATOGRAPHY (  ERCP) WITH PROPOFOL N/A 11/04/2016   Procedure: ENDOSCOPIC RETROGRADE CHOLANGIOPANCREATOGRAPHY (ERCP) WITH PROPOFOL;  Surgeon: Kerin Salen, MD;  Location: [facility];  Service: Gastroenterology;  Laterality: N/A;   EYE SURGERY     bilateral cataracts   HERNIA REPAIR  01/2022   Social History   Social History Narrative   Dr.Stoneburner, Ophthalmologist   Dr.Peterson, Urologist   Dr.Lomax, Dermatologist     Immunization History  Administered  Date(s) Administered   Influenza Split 01/30/2012   Influenza, High Dose Seasonal PF 02/09/2017, 12/11/2017, 01/02/2019, 01/02/2020   Influenza, Seasonal, Injecte, Preservative Fre 01/03/2015   Influenza,inj,Quad PF,6+ Mos 01/08/2013, 12/08/2015   Influenza-Unspecified 01/03/2014   PFIZER(Purple Top)SARS-COV-2 Vaccination 05/31/2019, 06/25/2019, 01/10/2020   Pneumococcal Conjugate-13 06/06/2014   Pneumococcal Polysaccharide-23 06/02/2005   Td 04/04/2002   Tdap 12/14/2013   Zoster, Live 04/04/2009     Objective: Vital Signs: BP (!) 160/83 (BP Location: Left Arm, Patient Position: Sitting, Cuff Size: Normal)   Pulse 90   Resp 13   Ht 5' 2.75" (1.594 m)   Wt 102 lb 3.2 oz (46.4 kg)   BMI 18.25 kg/m    Physical Exam Vitals and nursing note reviewed.  Constitutional:      Appearance: She is well-developed.  HENT:     Head: Normocephalic and atraumatic.  Eyes:     Conjunctiva/sclera: Conjunctivae normal.  Cardiovascular:     Rate and Rhythm: Normal rate and regular rhythm.     Heart sounds: Normal heart sounds.  Pulmonary:     Effort: Pulmonary effort is normal.     Breath sounds: Normal breath sounds.  Abdominal:     General: Bowel sounds are normal.     Palpations: Abdomen is soft.  Musculoskeletal:     Cervical back: Normal range of motion.  Lymphadenopathy:     Cervical: No cervical adenopathy.  Skin:    General: Skin is warm and dry.     Capillary Refill: Capillary refill takes less than 2 seconds.  Neurological:     Mental Status: She is alert and oriented to person, place, and time.  Psychiatric:        Behavior: Behavior normal.     Musculoskeletal Exam: Cervical, thoracic or lumbar spine were in good range of motion.  Shoulders, elbows, wrist joints, MCPs PIPs and DIPs with good range of motion.  Hip joints and knee joints were in good range of motion without any warmth swelling or effusion.  There was no tenderness over ankles or MTPs.  CDAI Exam: CDAI  Score: -- Patient Global: --; Provider Global: -- Swollen: --; Tender: -- Joint Exam 02/13/2023   No joint exam has been documented for this visit   There is currently no information documented on the homunculus. Go to the Rheumatology activity and complete the homunculus joint exam.  Investigation: No additional findings.  Imaging: No results found.  Recent Labs: Lab Results  Component Value Date   WBC 8.5 09/14/2022   HGB 13.6 09/14/2022   PLT 293 09/14/2022   NA 140 09/14/2022   K 4.0 09/14/2022   CL 101 09/14/2022   CO2 31 09/14/2022   GLUCOSE 162 (H) 09/14/2022   BUN 23 09/14/2022   CREATININE 0.68 09/14/2022   BILITOT 0.5 09/14/2022   ALKPHOS 77 11/04/2016   AST 16 09/14/2022   ALT 13 09/14/2022   PROT 7.0 09/14/2022   ALBUMIN 3.7 11/04/2016   CALCIUM 9.5 09/14/2022   GFRAA 97 06/22/2020    Speciality Comments: Prior therapy: Bisphosphonates, Forteo:  18 months completed, and then transitioned to Prolia (about 5 years ago).  Last Prolia 01/22 restarted March 14, 2022  Procedures:  No procedures performed Allergies: Chlorhexidine, Adhesive [tape], and Latex   Assessment / Plan:     Visit Diagnoses: Age-related osteoporosis without current pathological fracture - DEXA 05/31/2021 T-score: -2.9, BMD: 0.531 right femoral neck.  Right total femur -3%, left total femur -1%, AP spine +11%.  Prolia was restarted on March 14, 2022.  Last Prolia: 09/22/2022.  DEXA scan results were discussed with the patient at length today.  We will continue Prolia.  Patient has been taking calcium and vitamin D and has been exercising on a regular basis.  We will obtain labs 1 week prior to the next Prolia injection.- Plan: VITAMIN D 25 Hydroxy (Vit-D Deficiency, Fractures)  Vitamin D deficiency -she had vitamin D deficiency in the past.  Will check vitamin D level.  Plan: VITAMIN D 25 Hydroxy (Vit-D Deficiency, Fractures)  Medication monitoring encounter - She had  bisphosphonates-many years, Forteo: 18 months completed, and then transitioned to Prolia (about 5 years ago).  She had a gap of about 2 years and Prolia therapy.- Plan: COMPLETE METABOLIC PANEL WITH GFR next month.  Height loss - x-rays of the thoracic spine were obtained showed two possible old compresiion fractures in the thoracic region.  Spondylosis of lumbar spine - X-rays of the lumbar spine showed degenerative changes but no acute fracture.  Gastroesophageal reflux disease without esophagitis  Vegetarian diet-vitamin B12 supplement was advised.  Hyperglycemia  Calculus of gallbladder without cholecystitis without obstruction-status postcholecystectomy  Divergence insufficiency  Orders: Orders Placed This Encounter  Procedures   COMPLETE METABOLIC PANEL WITH GFR   VITAMIN D 25 Hydroxy (Vit-D Deficiency, Fractures)   No orders of the defined types were placed in this encounter.   Follow-Up Instructions: Return in about 7 months (around 09/13/2023) for Osteoporosis, Osteoarthritis.   Pollyann Savoy, MD  Note - This record has been created using Animal nutritionist.  Chart creation errors have been sought, but may not always  have been located. Such creation errors do not reflect on  the standard of medical care.

## 2023-02-13 ENCOUNTER — Encounter: Payer: Self-pay | Admitting: Rheumatology

## 2023-02-13 ENCOUNTER — Ambulatory Visit: Payer: Medicare PPO | Attending: Rheumatology | Admitting: Rheumatology

## 2023-02-13 VITALS — BP 160/83 | HR 90 | Resp 13 | Ht 62.75 in | Wt 102.2 lb

## 2023-02-13 DIAGNOSIS — Z5181 Encounter for therapeutic drug level monitoring: Secondary | ICD-10-CM | POA: Diagnosis not present

## 2023-02-13 DIAGNOSIS — K219 Gastro-esophageal reflux disease without esophagitis: Secondary | ICD-10-CM

## 2023-02-13 DIAGNOSIS — Z789 Other specified health status: Secondary | ICD-10-CM

## 2023-02-13 DIAGNOSIS — E559 Vitamin D deficiency, unspecified: Secondary | ICD-10-CM | POA: Diagnosis not present

## 2023-02-13 DIAGNOSIS — M47816 Spondylosis without myelopathy or radiculopathy, lumbar region: Secondary | ICD-10-CM

## 2023-02-13 DIAGNOSIS — H518 Other specified disorders of binocular movement: Secondary | ICD-10-CM

## 2023-02-13 DIAGNOSIS — R739 Hyperglycemia, unspecified: Secondary | ICD-10-CM

## 2023-02-13 DIAGNOSIS — M810 Age-related osteoporosis without current pathological fracture: Secondary | ICD-10-CM

## 2023-02-13 DIAGNOSIS — K8020 Calculus of gallbladder without cholecystitis without obstruction: Secondary | ICD-10-CM

## 2023-02-13 DIAGNOSIS — R29890 Loss of height: Secondary | ICD-10-CM

## 2023-03-14 ENCOUNTER — Telehealth: Payer: Self-pay | Admitting: Pharmacist

## 2023-03-14 DIAGNOSIS — M810 Age-related osteoporosis without current pathological fracture: Secondary | ICD-10-CM

## 2023-03-14 NOTE — Telephone Encounter (Signed)
Patient due for Prolia on 03/21/23. Pre-certification for Prolia is active through 04/04/23  Patient needs updated CMET and Vitamin D. Future orders in place. Patient will stop by next week for labs  Chesley Mires, PharmD, MPH, BCPS, CPP Clinical Pharmacist (Rheumatology and Pulmonology)

## 2023-03-16 ENCOUNTER — Other Ambulatory Visit (HOSPITAL_COMMUNITY)
Admission: AD | Admit: 2023-03-16 | Discharge: 2023-03-16 | Disposition: A | Discharge status: Home / Self Care | Payer: Medicare PPO | Source: Ambulatory Visit | Attending: Ophthalmology | Admitting: Ophthalmology

## 2023-03-16 DIAGNOSIS — M810 Age-related osteoporosis without current pathological fracture: Secondary | ICD-10-CM | POA: Diagnosis present

## 2023-03-16 DIAGNOSIS — E559 Vitamin D deficiency, unspecified: Secondary | ICD-10-CM | POA: Diagnosis present

## 2023-03-16 DIAGNOSIS — Z5181 Encounter for therapeutic drug level monitoring: Secondary | ICD-10-CM | POA: Insufficient documentation

## 2023-03-16 LAB — COMPREHENSIVE METABOLIC PANEL
ALT: 17 U/L (ref 0–44)
AST: 22 U/L (ref 15–41)
Albumin: 4.2 g/dL (ref 3.5–5.0)
Alkaline Phosphatase: 51 U/L (ref 38–126)
Anion gap: 8 (ref 5–15)
BUN: 14 mg/dL (ref 8–23)
CO2: 28 mmol/L (ref 22–32)
Calcium: 9.3 mg/dL (ref 8.9–10.3)
Chloride: 103 mmol/L (ref 98–111)
Creatinine, Ser: 0.69 mg/dL (ref 0.44–1.00)
GFR, Estimated: >60 mL/min (ref >60)
Glucose, Bld: 99 mg/dL (ref 70–99)
Potassium: 4.3 mmol/L (ref 3.5–5.1)
Sodium: 139 mmol/L (ref 135–145)
Total Bilirubin: 0.2 mg/dL (ref <1.2)
Total Protein: 7 g/dL (ref 6.5–8.1)

## 2023-03-16 LAB — VITAMIN D 25 HYDROXY (VIT D DEFICIENCY, FRACTURES): Vit D, 25-Hydroxy: 39.12 ng/mL (ref 30–100)

## 2023-03-23 ENCOUNTER — Other Ambulatory Visit (HOSPITAL_COMMUNITY): Payer: Self-pay

## 2023-03-23 ENCOUNTER — Other Ambulatory Visit: Payer: Self-pay | Admitting: Pharmacist

## 2023-03-23 ENCOUNTER — Other Ambulatory Visit: Payer: Self-pay

## 2023-03-23 DIAGNOSIS — M810 Age-related osteoporosis without current pathological fracture: Secondary | ICD-10-CM

## 2023-03-23 MED ORDER — DENOSUMAB 60 MG/ML ~~LOC~~ SOSY
60.0000 mg | PREFILLED_SYRINGE | Freq: Once | SUBCUTANEOUS | Status: AC
  Administered 2023-04-06: 60 mg via SUBCUTANEOUS

## 2023-03-23 MED ORDER — DENOSUMAB 60 MG/ML ~~LOC~~ SOSY
60.0000 mg | PREFILLED_SYRINGE | SUBCUTANEOUS | 0 refills | Status: DC
  Filled 2023-03-23: qty 1, 180d supply, fill #0

## 2023-03-23 NOTE — Progress Notes (Signed)
Specialty Pharmacy Initial Fill Coordination Note  Adriana [name] is a 82 y.o. female contacted today regarding initial fill of specialty medication(s) Denosumab [name])   Patient requested Courier to Provider Office   Delivery date: 03/27/23   Verified address: [address] [place], [place] [address]   Medication will be filled on 03/24/23.   Patient is aware of $64 copayment. CC info collected and forwarded to Raimon at [facility].

## 2023-03-23 NOTE — Addendum Note (Signed)
Addended by: Murrell Redden on: 03/23/2023 09:16 AM   Modules accepted: Orders

## 2023-03-23 NOTE — Telephone Encounter (Signed)
Patient wants to complete Prolia in office. Copay is $64. Patient aware that someone will call to collect copay before visit on 04/06/2023  Adriana [name], PharmD, MPH, BCPS, CPP Clinical Pharmacist (Rheumatology and Pulmonology)

## 2023-03-24 ENCOUNTER — Other Ambulatory Visit: Payer: Self-pay

## 2023-03-27 NOTE — Progress Notes (Signed)
 Pharmacy Note  Subjective:   Patient presents to clinic today to receive bi-annual dose of Prolia . Patient's last dose of Prolia  was on 09/22/2022 ([facility] at [facility])  Patient running a fever or have signs/symptoms of infection? No  Patient currently on antibiotics for the treatment of infection? No  Patient had fall in the last 6 months?  No     Patient taking calcium 1200 mg daily through diet or supplement and at least 800 units vitamin D ? Yes  Objective: CMP     Component Value Date/Time   NA 139 03/16/2023 1525   NA 142 06/05/2015 0844   K 4.3 03/16/2023 1525   CL 103 03/16/2023 1525   CO2 28 03/16/2023 1525   GLUCOSE 99 03/16/2023 1525   BUN 14 03/16/2023 1525   BUN 12 06/05/2015 0844   CREATININE 0.69 03/16/2023 1525   CREATININE 0.68 09/14/2022 0853   CALCIUM 9.3 03/16/2023 1525   PROT 7.0 03/16/2023 1525   PROT 6.9 06/05/2015 0844   ALBUMIN 4.2 03/16/2023 1525   ALBUMIN 4.4 06/05/2015 0844   AST 22 03/16/2023 1525   ALT 17 03/16/2023 1525   ALKPHOS 51 03/16/2023 1525   BILITOT 0.2 03/16/2023 1525   BILITOT 0.5 06/05/2015 0844   GFRNONAA >60 03/16/2023 1525   GFRNONAA 84 06/22/2020 0806   GFRAA 97 06/22/2020 0806    CBC    Component Value Date/Time   WBC 8.5 09/14/2022 0853   RBC 4.47 09/14/2022 0853   HGB 13.6 09/14/2022 0853   HGB 13.1 06/05/2015 0844   HCT 41.8 09/14/2022 0853   HCT 38.7 06/05/2015 0844   PLT 293 09/14/2022 0853   PLT 259 06/05/2015 0844   MCV 93.5 09/14/2022 0853   MCV 89 06/05/2015 0844   MCH 30.4 09/14/2022 0853   MCHC 32.5 09/14/2022 0853   RDW 12.4 09/14/2022 0853   RDW 12.9 06/05/2015 0844   LYMPHSABS 1,887 09/14/2022 0853   LYMPHSABS 1.8 06/05/2015 0844   MONOABS 462 06/06/2016 0822   EOSABS 60 09/14/2022 0853   EOSABS 0.2 06/05/2015 0844   BASOSABS 43 09/14/2022 0853   BASOSABS 0.0 06/05/2015 0844    Lab Results  Component Value Date   VD25OH 39.12 03/16/2023   T-score: 05/31/2021  Assessment/Plan:    Reviewed importance of adequate dietary intake of calcium in addition to supplementation due to risk of hypocalcemia with Prolia .   Patient tolerated injection without issue.   Administration details as below: Administrations This Visit     denosumab  (PROLIA ) injection 60 mg     Admin Date 04/06/2023 Action Given Dose 60 mg Route Subcutaneous Documented By Dayne Sherry RAMAN, RPH-CPP           Patient's next Prolia  dose is due on 10/03/2023.  Patient is due for updated DEXA in February 2025. Order placed today   All questions encouraged and answered.  Instructed patient to call with any further questions or concerns.   Sherry Dayne, PharmD, MPH, BCPS, CPP Clinical Pharmacist (Rheumatology and Pulmonology)

## 2023-03-27 NOTE — Patient Instructions (Signed)
 Bone density is due to be updated in February 2025. We've placed an order at [facility] where you completed previous scan

## 2023-04-06 ENCOUNTER — Ambulatory Visit: Payer: Medicare PPO | Attending: Rheumatology | Admitting: Pharmacist

## 2023-04-06 ENCOUNTER — Ambulatory Visit: Payer: Medicare PPO | Admitting: Pharmacist

## 2023-04-06 DIAGNOSIS — Z7689 Persons encountering health services in other specified circumstances: Secondary | ICD-10-CM

## 2023-04-06 DIAGNOSIS — Z5181 Encounter for therapeutic drug level monitoring: Secondary | ICD-10-CM

## 2023-04-06 DIAGNOSIS — M810 Age-related osteoporosis without current pathological fracture: Secondary | ICD-10-CM | POA: Diagnosis not present

## 2023-06-05 LAB — HM DEXA SCAN

## 2023-06-07 ENCOUNTER — Telehealth: Payer: Self-pay | Admitting: *Deleted

## 2023-06-07 NOTE — Telephone Encounter (Signed)
 Received DEXA results from [facility].  Date of Scan: 06/05/2023  Lowest T-score: -3.1  BMD:0.508  Lowest site measured:Left Femoral Neck  DX: Osteoporosis   Significant changes in BMD and site measured (5% and above):4% Left Total Femur  Current Regimen:Calcium, Vitamin D, Prolia Last Injection 04/06/2023  Recommendation:Discuss at upcoming visit.   Reviewed by: Sherron Ales, PA-C  Next Appointment:  09/13/2023

## 2023-06-26 ENCOUNTER — Encounter: Payer: Self-pay | Admitting: Rheumatology

## 2023-08-30 NOTE — Progress Notes (Signed)
 Office Visit Note  Patient: Adriana [name]             Date of Birth: 09/23/1940           MRN: [id number]             PCP: Patient, No Pcp Per Referring: Morgan Arab, MD Visit Date: 09/13/2023 Occupation: @GUAROCC @  Subjective:  Medication management  History of Present Illness: Adriana [name] is a 83 y.o. female with osteoporosis and osteoarthritis.  She returns today after her last visit in November 2024.  She restarted Prolia  injections in December 2023 after a gap of about 2 years.  As she has been taking Prolia  injections every 6 months since then.  Her last Prolia  injection was on April 06, 2023.  She denies any side effects from Prolia .  She has been taking calcium and vitamin D  on a regular basis.  She continues to walk on a regular basis she is also doing strength training.  She denies discomfort in her joints.     Activities of Daily Living:  Patient reports morning stiffness for 0 minute.   Patient Denies nocturnal pain.  Difficulty dressing/grooming: Denies Difficulty climbing stairs: Denies Difficulty getting out of chair: Reports Difficulty using hands for taps, buttons, cutlery, and/or writing: Denies  Review of Systems  Constitutional:  Negative for fatigue.  HENT:  Negative for mouth sores and mouth dryness.   Eyes:  Negative for dryness.  Respiratory:  Negative for shortness of breath.   Cardiovascular:  Negative for chest pain and palpitations.  Gastrointestinal:  Negative for blood in stool, constipation and diarrhea.  Endocrine: Negative for increased urination.  Genitourinary:  Negative for involuntary urination.  Musculoskeletal:  Positive for muscle weakness. Negative for joint pain, gait problem, joint pain, joint swelling, myalgias, morning stiffness, muscle tenderness and myalgias.  Skin:  Negative for color change, rash, hair loss and sensitivity to sunlight.  Allergic/Immunologic: Negative for susceptible to infections.  Neurological:  Negative  for dizziness and headaches.  Hematological:  Negative for swollen glands.  Psychiatric/Behavioral:  Negative for depressed mood and sleep disturbance. The patient is not nervous/anxious.     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   Vegetarian diet 06/14/2015   GERD (gastroesophageal reflux disease)    Senile osteoporosis 02/08/2013    Past Medical History:  Diagnosis Date   Cancer (HCC)    squamous cell face removed   Cellulitis, toe    osteomylitis   Cholelithiasis    Diverticulitis    Heart murmur    mitral valve proplase    Hiatal hernia    hx of 25 years ago   Hyperlipidemia    Mitral valve prolapse    Osteoporosis    PONV (postoperative nausea and vomiting)     Family History  Problem Relation Age of Onset   Cerebrovascular Accident Mother 34   Stroke Mother    Pneumonia Father 53   Breast cancer Sister    Healthy Son    Healthy Son    Past Surgical History:  Procedure Laterality Date   CHOLECYSTECTOMY     11/02/16 Dr. Sofia Dunn   CHOLECYSTECTOMY N/A 11/02/2016   Procedure: LAPAROSCOPIC CHOLECYSTECTOMY WITH INTRAOPERATIVE  CHOLANGIOGRAM;  Surgeon: Oralee Billow, MD;  Location: [facility];  Service: General;  Laterality: N/A;   COLONOSCOPY  05/2003   No polyps, hemorrhoids,and diverticulosis    ENDOSCOPIC RETROGRADE CHOLANGIOPANCREATOGRAPHY (ERCP) WITH PROPOFOL  N/A 11/04/2016   Procedure: ENDOSCOPIC RETROGRADE CHOLANGIOPANCREATOGRAPHY (ERCP) WITH PROPOFOL ;  Surgeon: Genell Ken, MD;  Location: [facility];  Service: Gastroenterology;  Laterality: N/A;   EYE SURGERY     bilateral cataracts   HERNIA REPAIR  01/2022   Social History   Social  History Narrative   Dr.Stoneburner, Ophthalmologist   Dr.Peterson, Urologist   Dr.Lomax, Dermatologist     Immunization History  Administered Date(s) Administered   Influenza Split 01/30/2012   Influenza, High Dose Seasonal PF 02/09/2017, 12/11/2017, 01/02/2019, 01/02/2020   Influenza, Seasonal, Injecte, Preservative Fre 01/03/2015   Influenza,inj,Quad PF,6+ Mos 01/08/2013, 12/08/2015   Influenza-Unspecified 01/03/2014   Moderna Covid-19 Fall Seasonal Vaccine 56yrs & older 02/28/2022, 01/11/2023   PFIZER(Purple Top)SARS-COV-2 Vaccination 05/31/2019, 06/25/2019, 01/10/2020   Pneumococcal Conjugate-13 06/06/2014   Pneumococcal Polysaccharide-23 06/02/2005   Td 04/04/2002   Tdap 12/14/2013   Zoster, Live 04/04/2009     Objective: Vital Signs: BP (!) 169/77 (BP Location: Left Arm, Patient Position: Sitting, Cuff Size: Normal)   Pulse 88   Resp 14   Ht 5' 2.75 (1.594 m)   Wt 101 lb (45.8 kg)   BMI 18.03 kg/m    Physical Exam Vitals and nursing note reviewed.  Constitutional:      Appearance: She is well-developed.  HENT:     Head: Normocephalic and atraumatic.  Eyes:     Conjunctiva/sclera: Conjunctivae normal.  Cardiovascular:     Rate and Rhythm: Normal rate and regular rhythm.     Heart sounds: Normal heart sounds.  Pulmonary:     Effort: Pulmonary effort is normal.     Breath sounds: Normal breath sounds.  Abdominal:     General: Bowel sounds are normal.     Palpations: Abdomen is soft.  Musculoskeletal:     Cervical back: Normal range of motion.  Lymphadenopathy:     Cervical: No cervical adenopathy.  Skin:    General: Skin is warm and dry.     Capillary Refill: Capillary refill takes less than 2 seconds.  Neurological:     Mental Status: She is alert and oriented to person, place, and time.  Psychiatric:        Behavior: Behavior normal.      Musculoskeletal Exam: Cervical, thoracic and lumbar spine were in good range of motion.  She had no point  tenderness.  Shoulders, elbows, wrists, MCPs PIPs and DIPs Juengel range of motion.  Mild PIP and DIP prominence was noted.  Hip joints and knee joints in good range of motion.  No warmth swelling or effusion was noted.  There was no tenderness over ankles or MTPs.  CDAI Exam: CDAI Score: -- Patient Global: --; Provider Global: -- Swollen: --; Tender: -- Joint Exam 09/13/2023   No joint exam has been documented for this visit   There is currently no information documented on the homunculus. Go to the Rheumatology activity and complete the homunculus joint exam.  Investigation: No additional findings.  Imaging: No results found.  Recent Labs: Lab Results  Component Value Date   WBC 8.5 09/14/2022   HGB 13.6 09/14/2022   PLT 293 09/14/2022   NA 139 03/16/2023   K 4.3 03/16/2023   CL 103 03/16/2023   CO2 28 03/16/2023   GLUCOSE 99 03/16/2023   BUN  14 03/16/2023   CREATININE 0.69 03/16/2023   BILITOT 0.2 03/16/2023   ALKPHOS 51 03/16/2023   AST 22 03/16/2023   ALT 17 03/16/2023   PROT 7.0 03/16/2023   ALBUMIN 4.2 03/16/2023   CALCIUM 9.3 03/16/2023   GFRAA 97 06/22/2020    Speciality Comments: Prior therapy: Bisphosphonates, Forteo : 18 months completed, and then transitioned to Prolia  (about 5 years ago).  Last Prolia  01/22 restarted March 14, 2022      Procedures:  No procedures performed Allergies: Chlorhexidine , Adhesive [tape], and Latex   Assessment / Plan:     Visit Diagnoses: Age-related osteoporosis without current pathological fracture - DEXA 05/31/2021 T-score: -2.9, BMD: 0.531 right femoral neck.  Right total femur -3%, left total femur -1%, AP spine +11%. June 05, 2023 DEXA scan T-score -3.1, BMD 0.508 left femoral neck, no comparison, -2% change in one third radius.  She did not have significant improvement in her BMD but did not have any significant decline as well.  I had a detailed discussion with the patient.  She has been on it since December 2023.   Will repeat DEXA scan again in March 2027.  If she does not have any significant improvement then we may consider adding anabolic agents.  Patient has been taking calcium and vitamin D .  She also exercises on a regular basis.  She is on a plant-based diet and occasionally eats fish.  Her BMI is 18.03.  Vitamin D  deficiency -vitamin D  was 39.12 on March 16, 2023.  She has been taking vitamin D  on a daily basis.  Plan: VITAMIN D  25 Hydroxy (Vit-D Deficiency, Fractures)  Medication monitoring encounter - She had bisphosphonates-many years, Forteo : 18 months completed, and then transitioned to Prolia  (about 5 years ago).  She had a And Prolia  for about 2 years.  She restarted Prolia  in December 2023.  Last prolia  injection: 04/06/2023 - Plan: Comprehensive metabolic panel with GFR today.  Height loss - x-rays of the thoracic spine were obtained showed two possible old compresiion fractures in the thoracic region.  She had no tenderness in the thoracic region.  Spondylosis of lumbar spine -she had good mobility in the lumbar spine without any discomfort.  X-rays of the lumbar spine showed degenerative changes but no acute fracture.  Gastroesophageal reflux disease without esophagitis  Vegetarian diet-increase calorie intake was discussed.  Her BMI is 18.03.  A handout on dietary modifications was placed in the AVS.  Hyperglycemia  Calculus of gallbladder without cholecystitis without obstruction-status postcholecystectomy - status postcholecystectomy  Divergence insufficiency  Orders: Orders Placed This Encounter  Procedures   Comprehensive metabolic panel with GFR   VITAMIN D  25 Hydroxy (Vit-D Deficiency, Fractures)   No orders of the defined types were placed in this encounter.    Follow-Up Instructions: Return in about 6 months (around 03/14/2024) for Osteoporosis.   Nicholas Bari, MD  Note - This record has been created using Animal nutritionist.  Chart creation errors have been  sought, but may not always  have been located. Such creation errors do not reflect on  the standard of medical care.

## 2023-09-12 ENCOUNTER — Other Ambulatory Visit: Payer: Self-pay

## 2023-09-13 ENCOUNTER — Encounter: Payer: Self-pay | Admitting: Rheumatology

## 2023-09-13 ENCOUNTER — Ambulatory Visit: Payer: Medicare PPO | Attending: Rheumatology | Admitting: Rheumatology

## 2023-09-13 VITALS — BP 131/71 | HR 93 | Resp 14 | Ht 62.75 in | Wt 101.0 lb

## 2023-09-13 DIAGNOSIS — K219 Gastro-esophageal reflux disease without esophagitis: Secondary | ICD-10-CM

## 2023-09-13 DIAGNOSIS — K8020 Calculus of gallbladder without cholecystitis without obstruction: Secondary | ICD-10-CM

## 2023-09-13 DIAGNOSIS — M810 Age-related osteoporosis without current pathological fracture: Secondary | ICD-10-CM | POA: Diagnosis not present

## 2023-09-13 DIAGNOSIS — Z789 Other specified health status: Secondary | ICD-10-CM

## 2023-09-13 DIAGNOSIS — M47816 Spondylosis without myelopathy or radiculopathy, lumbar region: Secondary | ICD-10-CM

## 2023-09-13 DIAGNOSIS — E559 Vitamin D deficiency, unspecified: Secondary | ICD-10-CM

## 2023-09-13 DIAGNOSIS — Z5181 Encounter for therapeutic drug level monitoring: Secondary | ICD-10-CM | POA: Diagnosis not present

## 2023-09-13 DIAGNOSIS — H518 Other specified disorders of binocular movement: Secondary | ICD-10-CM

## 2023-09-13 DIAGNOSIS — R29890 Loss of height: Secondary | ICD-10-CM

## 2023-09-13 DIAGNOSIS — R739 Hyperglycemia, unspecified: Secondary | ICD-10-CM

## 2023-09-13 NOTE — Patient Instructions (Signed)
Eating Plan for Osteoporosis Osteoporosis causes your bones to become weak and brittle. This puts you at greater risk for bone breaks (fractures) from small bumps or falls. Making changes to your diet and increasing your physical activity can help strengthen your bones and improve your overall health. Calcium and vitamin D are nutrients that play an important role in bone health. Vitamin D helps your body use calcium and strengthen bones. It is important to get enough calcium and vitamin D as part of your eating plan for osteoporosis. What are tips for following this plan? Reading food labels Try to get at least 1,000 milligrams (mg) of calcium each day. Look for foods that have at least 50 mg of calcium per serving. Talk with your health care provider about taking a calcium supplement if you do not get enough calcium from food. Do not have more than 2,500 mg of calcium each day. This is the upper limit for food and nutritional supplements combined. Too much calcium may cause constipation and prevent you from absorbing other important nutrients. Choose foods that contain vitamin D. Take a daily vitamin supplement that contains 800-1,000 international units (IU) of vitamin D. The amount may be different depending on your age, body weight, and where you live. Talk with your dietitian or health care provider about how much vitamin D is right for you. Avoid foods that have more than 300 mg of sodium per serving. Too much sodium can cause your body to lose calcium. Talk with your dietitian or health care provider about how much sodium you are allowed each day. Shopping Do not buy foods with added salt, including: Salted snacks. Rosita Fire. Canned soups. Canned meats. Processed meats, such as bacon or precooked or cured meat like sausages or meat loaves. Smoked fish. Meal planning Eat balanced meals that contain protein foods, fruits and vegetables, and foods rich in calcium and vitamin D. Eat at least  5 servings of fruits and vegetables each day. Eat 5-6 oz (142-170 g) of lean meat, poultry, fish, eggs, or beans each day. Lifestyle Do not use any products that contain nicotine or tobacco, such as cigarettes, e-cigarettes, and chewing tobacco. If you need help quitting, ask your health care provider. If your health care provider recommends that you lose weight: Work with a dietitian to develop an eating plan that will help you reach your desired weight goal. Exercise for at least 30 minutes a day, 5 or more days a week, or as told by your health care provider. Work with a physical therapist to develop an exercise plan that includes flexibility, balance, and strength exercises. Do not focus only on aerobic exercise. Do not drink alcohol if: Your health care provider tells you not to drink. You are pregnant, may be pregnant, or are planning to become pregnant. If you drink alcohol: Limit how much you use to: 0-1 drink a day for women. 0-2 drinks a day for men. Be aware of how much alcohol is in your drink. In the [place] one drink equals one 12 oz bottle of beer (355 mL), one 5 oz glass of wine (148 mL), or one 1 oz glass of hard liquor (44 mL). What foods should I eat? Foods high in calcium  Yogurt. Yogurt with fruit. Milk. Evaporated skim milk. Dry milk powder. Calcium-fortified orange juice. Parmesan cheese. Part-skim ricotta cheese. Natural hard cheese. Cream cheese. Cottage cheese. Canned sardines. Canned salmon. Calcium-treated tofu. Calcium-fortified cereal bar. Calcium-fortified cereal. Calcium-fortified graham crackers. Cooked collard greens. Turnip greens. Broccoli.  Kale. Almonds. White beans. Corn tortilla. Foods high in vitamin D Cod liver oil. Fatty fish, such as tuna, mackerel, and salmon. Milk. Fortified soy milk. Fortified fruit juice. Yogurt. Margarine. Egg yolks. Foods high in protein Beef. Lamb. Pork tenderloin. Chicken breast. Tuna (canned). Fish  fillet. Tofu. Cooked soy beans. Soy patty. Beans (canned or cooked). Cottage cheese. Yogurt. Peanut butter. Pumpkin seeds. Nuts. Sunflower seeds. Hard cheese. Milk or other milk products, such as soy milk. The items listed above may not be a complete list of foods and beverages you can eat. Contact a dietitian for more options. Summary Calcium and vitamin D are nutrients that play an important role in bone health and are an important part of your eating plan for osteoporosis. Eat balanced meals that contain protein foods, fruits and vegetables, and foods rich in calcium and vitamin D. Avoid foods that have more than 300 mg of sodium per serving. Too much sodium can cause your body to lose calcium. Exercise is an important part of prevention and treatment of osteoporosis. Aim for at least 30 minutes a day, 5 days a week. This information is not intended to replace advice given to you by your health care provider. Make sure you discuss any questions you have with your health care provider. Document Revised: 09/05/2019 Document Reviewed: 09/05/2019 Elsevier Patient Education  2024 [employer].

## 2023-09-14 ENCOUNTER — Ambulatory Visit: Payer: Self-pay | Admitting: Rheumatology

## 2023-09-14 LAB — COMPREHENSIVE METABOLIC PANEL WITH GFR
AG Ratio: 1.9 (calc) (ref 1.0–2.5)
ALT: 11 U/L (ref 6–29)
AST: 17 U/L (ref 10–35)
Albumin: 4.6 g/dL (ref 3.6–5.1)
Alkaline phosphatase (APISO): 54 U/L (ref 37–153)
BUN: 17 mg/dL (ref 7–25)
CO2: 27 mmol/L (ref 20–32)
Calcium: 9.6 mg/dL (ref 8.6–10.4)
Chloride: 103 mmol/L (ref 98–110)
Creat: 0.62 mg/dL (ref 0.60–0.95)
Globulin: 2.4 g/dL (ref 1.9–3.7)
Glucose, Bld: 164 mg/dL — ABNORMAL HIGH (ref 65–99)
Potassium: 4.6 mmol/L (ref 3.5–5.3)
Sodium: 141 mmol/L (ref 135–146)
Total Bilirubin: 0.5 mg/dL (ref 0.2–1.2)
Total Protein: 7 g/dL (ref 6.1–8.1)
eGFR: 88 mL/min/{1.73_m2} (ref >=60)

## 2023-09-14 LAB — VITAMIN D 25 HYDROXY (VIT D DEFICIENCY, FRACTURES): Vit D, 25-Hydroxy: 44 ng/mL (ref 30–100)

## 2023-09-14 NOTE — Progress Notes (Signed)
 Vitamin D  44 which is in the desirable range.  Glucose is elevated at 164.  Please notify patient and forward results to her PCP.

## 2023-10-17 ENCOUNTER — Other Ambulatory Visit (HOSPITAL_COMMUNITY): Payer: Self-pay

## 2023-10-17 ENCOUNTER — Other Ambulatory Visit: Payer: Self-pay

## 2023-10-17 ENCOUNTER — Telehealth: Payer: Self-pay | Admitting: Pharmacist

## 2023-10-17 ENCOUNTER — Other Ambulatory Visit: Payer: Self-pay | Admitting: Pharmacy Technician

## 2023-10-17 DIAGNOSIS — M810 Age-related osteoporosis without current pathological fracture: Secondary | ICD-10-CM

## 2023-10-17 MED ORDER — DENOSUMAB 60 MG/ML ~~LOC~~ SOSY
60.0000 mg | PREFILLED_SYRINGE | Freq: Once | SUBCUTANEOUS | Status: AC
  Administered 2023-10-23: 60 mg via SUBCUTANEOUS

## 2023-10-17 MED ORDER — DENOSUMAB 60 MG/ML ~~LOC~~ SOSY
60.0000 mg | PREFILLED_SYRINGE | SUBCUTANEOUS | 0 refills | Status: DC
  Filled 2023-10-17: qty 1, 180d supply, fill #0

## 2023-10-17 NOTE — Progress Notes (Signed)
 Specialty Pharmacy Refill Coordination Note  Adriana [name] is a 83 y.o. female assessed today regarding refills of clinic administered specialty medication(s) Denosumab  (PROLIA )   Clinic requested Courier to Provider Office   Delivery date: 10/18/23   Verified address: [facility] [address] [place] [address]   Medication will be filled on 10/17/23.   New RX received from clinic; Devki reviewed copay and medication with patient.

## 2023-10-17 NOTE — Progress Notes (Signed)
 VM Full. CC expired. Need updated payment information.

## 2023-10-17 NOTE — Telephone Encounter (Signed)
 Patient due for Prolia  on 10/03/2023.  Per test claim, her copay is $64. Rx sent to [facility] to be couriered to clinic before appt  Patient scheduled for Prolia  appt on 10/23/2023. Denies any upcoming invasive dental procedures  Sherry Pennant, PharmD, MPH, BCPS, CPP Clinical Pharmacist (Rheumatology and Pulmonology)

## 2023-10-18 ENCOUNTER — Other Ambulatory Visit (HOSPITAL_COMMUNITY): Payer: Self-pay

## 2023-10-23 ENCOUNTER — Ambulatory Visit: Attending: Rheumatology | Admitting: Pharmacist

## 2023-10-23 DIAGNOSIS — M810 Age-related osteoporosis without current pathological fracture: Secondary | ICD-10-CM | POA: Diagnosis not present

## 2023-10-23 DIAGNOSIS — Z7689 Persons encountering health services in other specified circumstances: Secondary | ICD-10-CM | POA: Diagnosis not present

## 2023-10-23 MED ORDER — DENOSUMAB 60 MG/ML ~~LOC~~ SOSY: 60.0000 mg | PREFILLED_SYRINGE | SUBCUTANEOUS | Status: AC

## 2023-10-23 NOTE — Progress Notes (Signed)
 Pharmacy Note  Subjective:   Patient presents to clinic today to receive bi-annual dose of Prolia . Patient's last dose of Prolia  was on 04/06/2023.  Patient running a fever or have signs/symptoms of infection? No  Patient currently on antibiotics for the treatment of infection? No  Patient had fall in the last 6 months?  No  If yes, did it require medical attention? No   Patient taking calcium 1200 mg daily through diet or supplement and at least 800 units vitamin D ? Yes  Objective: CMP     Component Value Date/Time   NA 141 09/13/2023 0822   NA 142 06/05/2015 0844   K 4.6 09/13/2023 0822   CL 103 09/13/2023 0822   CO2 27 09/13/2023 0822   GLUCOSE 164 (H) 09/13/2023 0822   BUN 17 09/13/2023 0822   BUN 12 06/05/2015 0844   CREATININE 0.62 09/13/2023 0822   CALCIUM 9.6 09/13/2023 0822   PROT 7.0 09/13/2023 0822   PROT 6.9 06/05/2015 0844   ALBUMIN 4.2 03/16/2023 1525   ALBUMIN 4.4 06/05/2015 0844   AST 17 09/13/2023 0822   ALT 11 09/13/2023 0822   ALKPHOS 51 03/16/2023 1525   BILITOT 0.5 09/13/2023 0822   BILITOT 0.5 06/05/2015 0844   GFRNONAA >60 03/16/2023 1525   GFRNONAA 84 06/22/2020 0806   GFRAA 97 06/22/2020 0806    CBC    Component Value Date/Time   WBC 8.5 09/14/2022 0853   RBC 4.47 09/14/2022 0853   HGB 13.6 09/14/2022 0853   HGB 13.1 06/05/2015 0844   HCT 41.8 09/14/2022 0853   HCT 38.7 06/05/2015 0844   PLT 293 09/14/2022 0853   PLT 259 06/05/2015 0844   MCV 93.5 09/14/2022 0853   MCV 89 06/05/2015 0844   MCH 30.4 09/14/2022 0853   MCHC 32.5 09/14/2022 0853   RDW 12.4 09/14/2022 0853   RDW 12.9 06/05/2015 0844   LYMPHSABS 1,887 09/14/2022 0853   LYMPHSABS 1.8 06/05/2015 0844   MONOABS 462 06/06/2016 0822   EOSABS 60 09/14/2022 0853   EOSABS 0.2 06/05/2015 0844   BASOSABS 43 09/14/2022 0853   BASOSABS 0.0 06/05/2015 0844    Lab Results  Component Value Date   VD25OH 44 09/13/2023    T-score: 05/31/2021  Assessment/Plan:   Reviewed  importance of adequate dietary intake of calcium in addition to supplementation due to risk of hypocalcemia with Prolia .   Patient tolerated injection well.   Administration details as below: Administrations This Visit     denosumab  (PROLIA ) injection 60 mg     Admin Date 10/23/2023 Action Given Dose 60 mg Route Subcutaneous Documented By Dayne Sherry RAMAN, RPH-CPP            Patient's next Prolia  dose is due on 05/26/2023.  Patient is due for updated DEXA in March 2027.   All questions encouraged and answered.  Instructed patient to call with any further questions or concerns.

## 2024-03-04 NOTE — Progress Notes (Signed)
 Office Visit Note  Patient: Adriana [name]             Date of Birth: 03-Jan-1941           MRN: [id number]             PCP: Patient, No Pcp Per Referring: No ref. provider found Visit Date: 03/15/2024 Occupation: Data Unavailable  Subjective:  Medication management  History of Present Illness: Adriana [name] is a 83 y.o. female with osteoporosis and osteoarthritis.  She returns today after her last visit in June 2025.  She states she has been tolerating Prolia  injections without any side effects.  Her last Prolia  injection was on October 23, 2023.  She has been taking calcium and vitamin D .  She continues to be on a vegetarian diet.  She walks her dog several times a day.    Activities of Daily Living:  Patient reports morning stiffness for 0 minutes.   Patient Denies nocturnal pain.  Difficulty dressing/grooming: Denies Difficulty climbing stairs: Denies Difficulty getting out of chair: Reports Difficulty using hands for taps, buttons, cutlery, and/or writing: Denies  Review of Systems  Constitutional:  Positive for fatigue.  HENT:  Negative for mouth sores and mouth dryness.   Eyes:  Negative for dryness.  Respiratory:  Negative for shortness of breath.   Cardiovascular:  Negative for chest pain and palpitations.  Gastrointestinal:  Negative for blood in stool, constipation and diarrhea.  Endocrine: Positive for increased urination.  Genitourinary:  Negative for involuntary urination.  Musculoskeletal:  Positive for muscle weakness. Negative for joint pain, gait problem, joint pain, joint swelling, myalgias, morning stiffness, muscle tenderness and myalgias.  Skin:  Positive for hair loss. Negative for color change, rash and sensitivity to sunlight.  Allergic/Immunologic: Negative for susceptible to infections.  Neurological:  Positive for headaches. Negative for dizziness.  Hematological:  Negative for swollen glands.  Psychiatric/Behavioral:  Positive for depressed mood.  Negative for sleep disturbance. The patient is nervous/anxious.     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   Vegetarian diet 06/14/2015   GERD (gastroesophageal reflux disease)    Senile osteoporosis 02/08/2013    Past Medical History:  Diagnosis Date   Cancer (HCC)    squamous cell face removed   Cellulitis, toe    osteomylitis   Cholelithiasis    Diverticulitis    Heart murmur    mitral valve proplase    Hiatal hernia    hx of 25 years ago   Hyperlipidemia    Mitral valve prolapse    Osteoporosis    PONV (postoperative nausea and vomiting)     Family History  Problem Relation Age of Onset   Cerebrovascular Accident Mother 16   Stroke Mother    Pneumonia Father 51   Breast cancer Sister    Healthy Son    Healthy Son    Past Surgical History:  Procedure Laterality Date   CHOLECYSTECTOMY     11/02/16 Dr. Eletha   CHOLECYSTECTOMY N/A 11/02/2016   Procedure: LAPAROSCOPIC CHOLECYSTECTOMY WITH INTRAOPERATIVE CHOLANGIOGRAM;  Surgeon: Eletha Boas, MD;  Location: [facility];  Service: General;  Laterality: N/A;   COLONOSCOPY  05/2003   No polyps, hemorrhoids,and diverticulosis  ENDOSCOPIC RETROGRADE CHOLANGIOPANCREATOGRAPHY (ERCP) WITH PROPOFOL  N/A 11/04/2016   Procedure: ENDOSCOPIC RETROGRADE CHOLANGIOPANCREATOGRAPHY (ERCP) WITH PROPOFOL ;  Surgeon: Saintclair Jasper, MD;  Location: [facility];  Service: Gastroenterology;  Laterality: N/A;   EYE SURGERY     bilateral cataracts   HERNIA REPAIR  01/2022   Social History   Tobacco Use   Smoking status: Never    Passive exposure: Never   Smokeless tobacco: Never  Vaping Use   Vaping status: Never  Used  Substance Use Topics   Alcohol use: No   Drug use: No   Social History   Social History Narrative   Dr.Stoneburner, Ophthalmologist   Dr.Peterson, Urologist   Dr.Lomax, Dermatologist       Immunization History  Administered Date(s) Administered   INFLUENZA, HIGH DOSE SEASONAL PF 02/09/2017, 12/11/2017, 01/02/2019, 01/02/2020   Influenza Split 01/30/2012   Influenza, Seasonal, Injecte, Preservative Fre 01/03/2015   Influenza,inj,Quad PF,6+ Mos 01/08/2013, 12/08/2015   Influenza-Unspecified 01/03/2014   Moderna Covid-19 Fall Seasonal Vaccine 28yrs & older 02/28/2022, 01/11/2023   PFIZER(Purple Top)SARS-COV-2 Vaccination 05/31/2019, 06/25/2019, 01/10/2020   Pneumococcal Conjugate-13 06/06/2014   Pneumococcal Polysaccharide-23 06/02/2005   Td 04/04/2002   Tdap 12/14/2013   Zoster, Live 04/04/2009     Objective: Vital Signs: BP (!) 172/76   Pulse 91   Temp (!) 97.2 F (36.2 C)   Resp 13   Ht 5' 2.75 (1.594 m)   Wt 103 lb 3.2 oz (46.8 kg)   BMI 18.43 kg/m    Physical Exam Vitals and nursing note reviewed.  Constitutional:      Appearance: She is well-developed.  HENT:     Head: Normocephalic and atraumatic.  Eyes:     Conjunctiva/sclera: Conjunctivae normal.  Cardiovascular:     Rate and Rhythm: Normal rate and regular rhythm.     Heart sounds: Normal heart sounds.  Pulmonary:     Effort: Pulmonary effort is normal.     Breath sounds: Normal breath sounds.  Abdominal:     General: Bowel sounds are normal.     Palpations: Abdomen is soft.  Musculoskeletal:     Cervical back: Normal range of motion.  Lymphadenopathy:     Cervical: No cervical adenopathy.  Skin:    General: Skin is warm and dry.     Capillary Refill: Capillary refill takes less than 2 seconds.  Neurological:     Mental Status: She is alert and oriented to person, place, and time.  Psychiatric:        Behavior: Behavior normal.      Musculoskeletal Exam: Cervical, thoracic and  lumbar spine were in good range of motion.  There was no SI joint tenderness.  Shoulder joints, elbow joints, wrist joints, MCPs, PIPs and DIPs were in good range of motion with no synovitis.  Hip joints and knee joints were in good range of motion without any warmth swelling or effusion.  There was no tenderness over ankles or MTPs.   CDAI Exam: CDAI Score: -- Patient Global: --; Provider Global: -- Swollen: --; Tender: -- Joint Exam 03/15/2024   No joint exam has been documented for this visit   There is currently no information documented on the homunculus. Go to the Rheumatology activity and complete the homunculus joint exam.  Investigation: No additional findings.  Imaging: No results found.  Recent Labs: Lab Results  Component Value Date   WBC 8.5 09/14/2022   HGB 13.6 09/14/2022   PLT 293 09/14/2022   NA 141 09/13/2023   K  4.6 09/13/2023   CL 103 09/13/2023   CO2 27 09/13/2023   GLUCOSE 164 (H) 09/13/2023   BUN 17 09/13/2023   CREATININE 0.62 09/13/2023   BILITOT 0.5 09/13/2023   ALKPHOS 51 03/16/2023   AST 17 09/13/2023   ALT 11 09/13/2023   PROT 7.0 09/13/2023   ALBUMIN 4.2 03/16/2023   CALCIUM 9.6 09/13/2023   GFRAA 97 06/22/2020    Speciality Comments: Prior therapy: Bisphosphonates, Forteo : 18 months completed, and then transitioned to Prolia  (about 5 years ago).  Last Prolia  01/22 restarted March 14, 2022  Procedures:  No procedures performed Allergies: Chlorhexidine , Adhesive [tape], and Latex   Assessment / Plan:     Visit Diagnoses: Age-related osteoporosis without current pathological fracture - June 05, 2023 DEXA scan T-score -3.1, BMD 0.508 left femoral neck, no comparison, -2% change in one third radius. Prolia  injection: 10/23/2023.  I had a detailed discussion with the patient.  She had no significant change in her DEXA scan.  However she has been on Prolia  injections only since December 2023 on a regular basis.  I would like to give Prolia   more time before switching therapy.  Calcium rich diet and vitamin D  use was emphasized.  Strengthening exercises were demonstrated in the office.  Vitamin D  deficiency-vitamin D  was 44 on September 13, 2023.  She was advised to continue calcium and vitamin D .  Medication monitoring encounter - bisphosphonates-many years, Forteo : 18 months completed, and then transitioned to Prolia  (about 5 years ago).  She had a And Prolia  for about 2 years.  Height loss - x-rays of the thoracic spine were obtained showed two possible old compresiion fractures in the thoracic region.  Spondylosis of lumbar spine -she has some discomfort getting up from the lying position.  She had no point tenderness over the thoracic or lumbar spine.  X-rays of the lumbar spine showed degenerative changes but no acute fracture.  Unstable gait-lower extremity muscle strengthening exercises were demonstrated in the office.  A handout on back and lower extremity exercise was given.  Elevated blood pressure reading-patient blood pressure was elevated at 172/76.  Repeat blood pressure was 159/81.  Patient has a blood pressure monitor at home.  Advised her to monitor blood pressure at home.  I will also refer her to her PCP as she does not have a PCP currently.  Gastroesophageal reflux disease without esophagitis-currently not symptomatic.  Hyperglycemia  Vegetarian diet-her BMI is 18.43.  Increasing calorie intake was discussed.  Calculus of gallbladder without cholecystitis without obstruction-status postcholecystectomy  Divergence insufficiency  Orders: Orders Placed This Encounter  Procedures   Comprehensive metabolic panel with GFR   No orders of the defined types were placed in this encounter.    Follow-Up Instructions: Return in about 6 months (around 09/13/2024) for Osteoporosis.   Maya Nash, MD  Note - This record has been created using Animal nutritionist.  Chart creation errors have been sought, but may not  always  have been located. Such creation errors do not reflect on  the standard of medical care.

## 2024-03-15 ENCOUNTER — Ambulatory Visit: Attending: Rheumatology | Admitting: Rheumatology

## 2024-03-15 ENCOUNTER — Encounter: Payer: Self-pay | Admitting: Rheumatology

## 2024-03-15 VITALS — BP 159/81 | HR 91 | Temp 97.2°F | Resp 13 | Ht 62.75 in | Wt 103.2 lb

## 2024-03-15 DIAGNOSIS — Z5181 Encounter for therapeutic drug level monitoring: Secondary | ICD-10-CM | POA: Diagnosis not present

## 2024-03-15 DIAGNOSIS — R2681 Unsteadiness on feet: Secondary | ICD-10-CM | POA: Diagnosis not present

## 2024-03-15 DIAGNOSIS — Z789 Other specified health status: Secondary | ICD-10-CM

## 2024-03-15 DIAGNOSIS — K219 Gastro-esophageal reflux disease without esophagitis: Secondary | ICD-10-CM

## 2024-03-15 DIAGNOSIS — H518 Other specified disorders of binocular movement: Secondary | ICD-10-CM | POA: Diagnosis not present

## 2024-03-15 DIAGNOSIS — R030 Elevated blood-pressure reading, without diagnosis of hypertension: Secondary | ICD-10-CM

## 2024-03-15 DIAGNOSIS — M47816 Spondylosis without myelopathy or radiculopathy, lumbar region: Secondary | ICD-10-CM

## 2024-03-15 DIAGNOSIS — K8020 Calculus of gallbladder without cholecystitis without obstruction: Secondary | ICD-10-CM

## 2024-03-15 DIAGNOSIS — R739 Hyperglycemia, unspecified: Secondary | ICD-10-CM

## 2024-03-15 DIAGNOSIS — E559 Vitamin D deficiency, unspecified: Secondary | ICD-10-CM

## 2024-03-15 DIAGNOSIS — R29890 Loss of height: Secondary | ICD-10-CM

## 2024-03-15 DIAGNOSIS — M810 Age-related osteoporosis without current pathological fracture: Secondary | ICD-10-CM

## 2024-03-15 NOTE — Addendum Note (Signed)
 Addended by: YVONE DAVED BROCKS on: 03/15/2024 08:33 AM   Modules accepted: Orders

## 2024-03-15 NOTE — Patient Instructions (Addendum)
 Exercises for Chronic Knee Pain Chronic knee pain is pain that lasts longer than 3 months. For most people with chronic knee pain, exercise and weight loss is an important part of treatment. Your health care provider may want you to focus on: Making the muscles that support your knee stronger. This can take pressure off your knee and reduce pain. Preventing knee stiffness. How far you can move your knee, keeping it there or making it farther. Losing weight (if this applies) to take pressure off your knee, lower your risk for injury, and make it easier for you to exercise. Your provider will help you make an exercise program that fits your needs and physical abilities. Below are simple, low-impact exercises you can do at home. Ask your provider or physical therapist how often you should do your exercise program and how many times to repeat each exercise. General safety tips  Get your provider's approval before doing any exercises. Start slowly and stop any time you feel pain. Do not exercise if your knee pain is flaring up. Warm up first. Stretching a cold muscle can cause an injury. Do 5-10 minutes of easy movement or light stretching before beginning your exercises. Do 5-10 minutes of low-impact activity (like walking or cycling) before starting strengthening exercises. Contact your provider any time you have pain during or after exercising. Exercise can cause discomfort but should not be painful. It is normal to be a little stiff or sore after exercising. Stretching and range-of-motion exercises Front thigh stretch  Stand up straight and support your body by holding on to a chair or resting one hand on a wall. With your legs straight and close together, bend one knee to lift your heel up toward your butt. Using one hand for support, grab your ankle with your free hand. Pull your foot up closer toward your butt to feel the stretch in front of your thigh. Hold the stretch for 30  seconds. Repeat __________ times. Complete this exercise __________ times a day. Back thigh stretch  Sit on the floor with your back straight and your legs out straight in front of you. Place the palms of your hands on the floor and slide them toward your feet as you bend at the hip. Try to touch your nose to your knees and feel the stretch in the back of your thighs. Hold for 30 seconds. Repeat __________ times. Complete this exercise __________ times a day. Calf stretch  Stand facing a wall. Place the palms of your hands flat against the wall, arms extended, and lean slightly against the wall. Get into a lunge position with one leg bent at the knee and the other leg stretched out straight behind you. Keep both feet facing the wall and increase the bend in your knee while keeping the heel of the other leg flat on the ground. You should feel the stretch in your calf. Hold for 30 seconds. Repeat __________ times. Complete this exercise __________ times a day. Strengthening exercises Straight leg lift  Lie on your back with one knee bent and the other leg out straight. Slowly lift the straight leg without bending the knee. Lift until your foot is about 12 inches (30 cm) off the floor. Hold for 3-5 seconds and slowly lower your leg. Repeat __________ times. Complete this exercise __________ times a day. Single leg dip  Stand between two chairs and put both hands on the backs of the chairs for support. Extend one leg out straight with your body  weight resting on the heel of the standing leg. Slowly bend your standing knee to dip your body to the level that is comfortable for you. Hold for 3-5 seconds. Repeat __________ times. Complete this exercise __________ times a day. Hamstring curls  Stand straight, knees close together, facing the back of a chair. Hold on to the back of a chair with both hands. Keep one leg straight. Bend the other knee while bringing the heel up toward the butt  until the knee is bent at a 90-degree angle (right angle). Hold for 3-5 seconds. Repeat __________ times. Complete this exercise __________ times a day. Wall squat  Stand straight with your back, hips, and head against a wall. Step forward one foot at a time with your back still against the wall. Your feet should be 2 feet (61 cm) from the wall at shoulder width. Keeping your back, hips, and head against the wall, slide down the wall to as close to a sitting position as you can get. Hold for 5-10 seconds, then slowly slide back up. Repeat __________ times. Complete this exercise __________ times a day. Step-ups  Stand in front of a sturdy platform or stool that is about 6 inches (15 cm) high. Slowly step up with your left / right foot, keeping your knee in line with your hip and foot. Do not let your knee bend so far that you cannot see your toes. Hold on to a chair for balance, but do not use it for support. Slowly unlock your knee and lower yourself to the starting position. Repeat __________ times. Complete this exercise __________ times a day. Contact a health care provider if: Your exercises cause pain. Your pain is worse after you exercise. Your pain prevents you from doing your exercises. This information is not intended to replace advice given to you by your health care provider. Make sure you discuss any questions you have with your health care provider. Document Revised: 04/05/2022 Document Reviewed: 04/05/2022 Elsevier Patient Education  2024 [employer]   Low Back Sprain or Strain Rehab Ask your health care provider which exercises are safe for you. Do exercises exactly as told by your health care provider and adjust them as directed. It is normal to feel mild stretching, pulling, tightness, or discomfort as you do these exercises. Stop right away if you feel sudden pain or your pain gets worse. Do not begin these exercises until told by your health care provider. Stretching  and range-of-motion exercises These exercises warm up your muscles and joints and improve the movement and flexibility of your back. These exercises also help to relieve pain, numbness, and tingling. Lumbar rotation  Lie on your back on a firm bed or the floor with your knees bent. Straighten your arms out to your sides so each arm forms a 90-degree angle (right angle) with a side of your body. Slowly move (rotate) both of your knees to one side of your body until you feel a stretch in your lower back (lumbar). Try not to let your shoulders lift off the floor. Hold this position for __________ seconds. Tense your abdominal muscles and slowly move your knees back to the starting position. Repeat this exercise on the other side of your body. Repeat __________ times. Complete this exercise __________ times a day. Single knee to chest  Lie on your back on a firm bed or the floor with both legs straight. Bend one of your knees. Use your hands to move your knee up toward  your chest until you feel a gentle stretch in your lower back and buttock. Hold your leg in this position by holding on to the front of your knee. Keep your other leg as straight as possible. Hold this position for __________ seconds. Slowly return to the starting position. Repeat with your other leg. Repeat __________ times. Complete this exercise __________ times a day. Prone extension on elbows  Lie on your abdomen on a firm bed or the floor (prone position). Prop yourself up on your elbows. Use your arms to help lift your chest up until you feel a gentle stretch in your abdomen and your lower back. This will place some of your body weight on your elbows. If this is uncomfortable, try stacking pillows under your chest. Your hips should stay down, against the surface that you are lying on. Keep your hip and back muscles relaxed. Hold this position for __________ seconds. Slowly relax your upper body and return to the  starting position. Repeat __________ times. Complete this exercise __________ times a day. Strengthening exercises These exercises build strength and endurance in your back. Endurance is the ability to use your muscles for a long time, even after they get tired. Pelvic tilt This exercise strengthens the muscles that lie deep in the abdomen. Lie on your back on a firm bed or the floor with your legs extended. Bend your knees so they are pointing toward the ceiling and your feet are flat on the floor. Tighten your lower abdominal muscles to press your lower back against the floor. This motion will tilt your pelvis so your tailbone points up toward the ceiling instead of pointing to your feet or the floor. To help with this exercise, you may place a small towel under your lower back and try to push your back into the towel. Hold this position for __________ seconds. Let your muscles relax completely before you repeat this exercise. Repeat __________ times. Complete this exercise __________ times a day. Alternating arm and leg raises  Get on your hands and knees on a firm surface. If you are on a hard floor, you may want to use padding, such as an exercise mat, to cushion your knees. Line up your arms and legs. Your hands should be directly below your shoulders, and your knees should be directly below your hips. Lift your left leg behind you. At the same time, raise your right arm and straighten it in front of you. Do not lift your leg higher than your hip. Do not lift your arm higher than your shoulder. Keep your abdominal and back muscles tight. Keep your hips facing the ground. Do not arch your back. Keep your balance carefully, and do not hold your breath. Hold this position for __________ seconds. Slowly return to the starting position. Repeat with your right leg and your left arm. Repeat __________ times. Complete this exercise __________ times a day. Abdominal set with straight leg  raise  Lie on your back on a firm bed or the floor. Bend one of your knees and keep your other leg straight. Tense your abdominal muscles and lift your straight leg up, 4-6 inches (10-15 cm) off the ground. Keep your abdominal muscles tight and hold this position for __________ seconds. Do not hold your breath. Do not arch your back. Keep it flat against the ground. Keep your abdominal muscles tense as you slowly lower your leg back to the starting position. Repeat with your other leg. Repeat __________ times. Complete this exercise  __________ times a day. Single leg lower with bent knees Lie on your back on a firm bed or the floor. Tense your abdominal muscles and lift your feet off the floor, one foot at a time, so your knees and hips are bent in 90-degree angles (right angles). Your knees should be over your hips and your lower legs should be parallel to the floor. Keeping your abdominal muscles tense and your knee bent, slowly lower one of your legs so your toe touches the ground. Lift your leg back up to return to the starting position. Do not hold your breath. Do not let your back arch. Keep your back flat against the ground. Repeat with your other leg. Repeat __________ times. Complete this exercise __________ times a day. Posture and body mechanics Good posture and healthy body mechanics can help to relieve stress in your body's tissues and joints. Body mechanics refers to the movements and positions of your body while you do your daily activities. Posture is part of body mechanics. Good posture means: Your spine is in its natural S-curve position (neutral). Your shoulders are pulled back slightly. Your head is not tipped forward (neutral). Follow these guidelines to improve your posture and body mechanics in your everyday activities. Standing  When standing, keep your spine neutral and your feet about hip-width apart. Keep a slight bend in your knees. Your ears, shoulders, and  hips should line up. When you do a task in which you stand in one place for a long time, place one foot up on a stable object that is 2-4 inches (5-10 cm) high, such as a footstool. This helps keep your spine neutral. Sitting  When sitting, keep your spine neutral and keep your feet flat on the floor. Use a footrest, if necessary, and keep your thighs parallel to the floor. Avoid rounding your shoulders, and avoid tilting your head forward. When working at a desk or a computer, keep your desk at a height where your hands are slightly lower than your elbows. Slide your chair under your desk so you are close enough to maintain good posture. When working at a computer, place your monitor at a height where you are looking straight ahead and you do not have to tilt your head forward or downward to look at the screen. Resting When lying down and resting, avoid positions that are most painful for you. If you have pain with activities such as sitting, bending, stooping, or squatting, lie in a position in which your body does not bend very much. For example, avoid curling up on your side with your arms and knees near your chest (fetal position). If you have pain with activities such as standing for a long time or reaching with your arms, lie with your spine in a neutral position and bend your knees slightly. Try the following positions: Lying on your side with a pillow between your knees. Lying on your back with a pillow under your knees. Lifting  When lifting objects, keep your feet at least shoulder-width apart and tighten your abdominal muscles. Bend your knees and hips and keep your spine neutral. It is important to lift using the strength of your legs, not your back. Do not lock your knees straight out. Always ask for help to lift heavy or awkward objects. This information is not intended to replace advice given to you by your health care provider. Make sure you discuss any questions you have with your  health care provider. Document Revised: 07/25/2022  Document Reviewed: 06/08/2020 Elsevier Patient Education  2024 [employer].

## 2024-03-16 LAB — COMPREHENSIVE METABOLIC PANEL WITH GFR
AG Ratio: 1.9 (calc) (ref 1.0–2.5)
ALT: 11 U/L (ref 6–29)
AST: 17 U/L (ref 10–35)
Albumin: 4.5 g/dL (ref 3.6–5.1)
Alkaline phosphatase (APISO): 73 U/L (ref 37–153)
BUN/Creatinine Ratio: 37 (calc) — ABNORMAL HIGH (ref 6–22)
BUN: 26 mg/dL — ABNORMAL HIGH (ref 7–25)
CO2: 32 mmol/L (ref 20–32)
Calcium: 9.8 mg/dL (ref 8.6–10.4)
Chloride: 101 mmol/L (ref 98–110)
Creat: 0.71 mg/dL (ref 0.60–0.95)
Globulin: 2.4 g/dL (ref 1.9–3.7)
Glucose, Bld: 125 mg/dL — ABNORMAL HIGH (ref 65–99)
Potassium: 4.2 mmol/L (ref 3.5–5.3)
Sodium: 141 mmol/L (ref 135–146)
Total Bilirubin: 0.3 mg/dL (ref 0.2–1.2)
Total Protein: 6.9 g/dL (ref 6.1–8.1)
eGFR: 84 mL/min/1.73m2 (ref >=60)

## 2024-03-17 ENCOUNTER — Ambulatory Visit: Payer: Self-pay | Admitting: Rheumatology

## 2024-03-18 ENCOUNTER — Emergency Department (HOSPITAL_COMMUNITY)

## 2024-03-18 ENCOUNTER — Emergency Department (HOSPITAL_COMMUNITY)
Admission: EM | Admit: 2024-03-18 | Discharge: 2024-03-18 | Disposition: A | Discharge status: Home / Self Care | Attending: Emergency Medicine | Admitting: Emergency Medicine

## 2024-03-18 ENCOUNTER — Other Ambulatory Visit: Payer: Self-pay

## 2024-03-18 ENCOUNTER — Encounter (HOSPITAL_COMMUNITY): Payer: Self-pay

## 2024-03-18 DIAGNOSIS — M549 Dorsalgia, unspecified: Secondary | ICD-10-CM | POA: Diagnosis not present

## 2024-03-18 DIAGNOSIS — S199XXA Unspecified injury of neck, initial encounter: Secondary | ICD-10-CM | POA: Diagnosis present

## 2024-03-18 DIAGNOSIS — Z91040 Latex allergy status: Secondary | ICD-10-CM | POA: Diagnosis not present

## 2024-03-18 DIAGNOSIS — X503XXA Overexertion from repetitive movements, initial encounter: Secondary | ICD-10-CM | POA: Diagnosis not present

## 2024-03-18 DIAGNOSIS — S161XXA Strain of muscle, fascia and tendon at neck level, initial encounter: Secondary | ICD-10-CM | POA: Diagnosis not present

## 2024-03-18 DIAGNOSIS — T148XXA Other injury of unspecified body region, initial encounter: Secondary | ICD-10-CM

## 2024-03-18 MED ORDER — ACETAMINOPHEN 500 MG PO TABS
1000.0000 mg | ORAL_TABLET | Freq: Once | ORAL | Status: AC
  Administered 2024-03-18: 09:00:00 1000 mg via ORAL
  Filled 2024-03-18: qty 2

## 2024-03-18 MED ORDER — LIDOCAINE 5 % EX PTCH
1.0000 | MEDICATED_PATCH | CUTANEOUS | Status: DC
  Administered 2024-03-18: 09:00:00 1 via TRANSDERMAL
  Filled 2024-03-18: qty 1

## 2024-03-18 MED ORDER — LIDOCAINE 5 % EX OINT: 1.0000 | TOPICAL_OINTMENT | Freq: Four times a day (QID) | CUTANEOUS | 0 refills | Status: AC | PRN

## 2024-03-18 NOTE — ED Provider Notes (Signed)
 [place] EMERGENCY DEPARTMENT AT [facility] Provider Note   CSN: [id number] Arrival date & time: 03/18/24  9250     Patient presents with: Back Pain and Neck Pain   Adriana [name] is a 83 y.o. female with PMHx HLD, OA who presents to ED concerned for neck pain. Patient did have some neck pain upon waking up a couple days ago which resolved by the end of the day. Patient with unrelated appointment with Rheumatologist since then and was told that it was probably a muscle strain. Patient felt better until waking up this morning. Patient endorsing 10/10 severity pain since this morning. Denies trauma. Patient has not taken any medications for pain today.  Patient stating that she did exert herself a lot yesterday while raking and cleaning up a fallen branch in her yard which may have contributed to symptoms today.  Denies extremity numbness or paresthesias or weakness, fever, immunosuppression, IVDU, spinal procedure, significant trauma. Denies chest pain, cough, SOB, nausea, vomiting, diarrhea. Denies current headache, vision changes, or lightheadedness/dizziness.      Back Pain Neck Pain      Prior to Admission medications  Medication Sig Start Date End Date Taking? Authorizing Provider  lidocaine  (XYLOCAINE ) 5 % ointment Apply 1 Application topically 4 (four) times daily as needed for up to 27 days. 03/18/24 04/14/24 Yes Hoy Nidia FALCON, PA-C  Cholecalciferol (VITAMIN D -3) 1000 units CAPS Take 2,000 Units by mouth daily with supper.    [provider]  denosumab  (PROLIA ) 60 MG/ML SOSY injection Inject 60 mg into the skin every 6 (six) months. Courier to rheum: [address], [place] [address]. Appt on 10/23/23 10/17/23   Dolphus Reiter, MD  vitamin B-12 (CYANOCOBALAMIN) 1000 MCG tablet Take 1,000 mcg by mouth daily.    [provider]    Allergies: Chlorhexidine , Adhesive [tape], and Latex    Review of Systems   Musculoskeletal:  Positive for back pain and neck pain.    Updated Vital Signs BP (!) 172/84 (BP Location: Left Arm)   Pulse 92   Temp 97.7 F (36.5 C) (Oral)   Resp 18   SpO2 100%   Physical Exam Vitals and nursing note reviewed.  Constitutional:      General: She is not in acute distress.    Appearance: She is not ill-appearing or toxic-appearing.  HENT:     Head: Normocephalic and atraumatic.     Mouth/Throat:     Mouth: Mucous membranes are moist.  Eyes:     General: No scleral icterus.       Right eye: No discharge.        Left eye: No discharge.     Conjunctiva/sclera: Conjunctivae normal.  Cardiovascular:     Rate and Rhythm: Normal rate and regular rhythm.     Pulses: Normal pulses.     Heart sounds: Normal heart sounds. No murmur heard. Pulmonary:     Effort: Pulmonary effort is normal. No respiratory distress.     Breath sounds: Normal breath sounds. No wheezing, rhonchi or rales.  Abdominal:     General: Abdomen is flat.  Musculoskeletal:     Right lower leg: No edema.     Left lower leg: No edema.     Comments: Left upper trapezius is tender to palpation. No midline tenderness, step-off, crepitus, swelling, or erythema.  Skin:    General: Skin is warm and dry.     Findings: No rash.  Neurological:  General: No focal deficit present.     Mental Status: She is alert and oriented to person, place, and time. Mental status is at baseline.     Comments: GCS 15. Speech is goal oriented. No deficits appreciated to CN III-XII; symmetric eyebrow raise, no facial drooping, tongue midline. Patient has equal grip strength bilaterally with 5/5 strength against resistance in all major muscle groups bilaterally. Sensation to light touch intact. Patient moves extremities without ataxia.    Psychiatric:        Mood and Affect: Mood normal.        Behavior: Behavior normal.     (all labs ordered are listed, but only abnormal results are displayed) Labs Reviewed - No  data to display  EKG: None  Radiology: CT Thoracic Spine Wo Contrast Result Date: 03/18/2024 EXAM: CT THORACIC SPINE WITHOUT CONTRAST 03/18/2024 08:56:10 AM TECHNIQUE: CT of the thoracic spine was performed without the administration of intravenous contrast. Multiplanar reformatted images are provided for review. Automated exposure control, iterative reconstruction, and/or weight based adjustment of the mA/kV was utilized to reduce the radiation dose to as low as reasonably achievable. COMPARISON: Cervical spine CT 03/18/2024 (reported separately). Thoracic spine radiographs 03/03/2022. Lumbar radiographs 03/03/2022. CLINICAL HISTORY: 83 year old female with acute neck pain and known malignancy. History of osteoarthritis and osteoporosis. No known injury. FINDINGS: BONES AND ALIGNMENT: Normal thoracic spine segmentation. Chronic compression fractures of T10 and T11, unchanged since prior radiographs. Subtle superior endplate compression at T8 and T9, age indeterminate, less than 10% loss of height. T6 compression fracture, stable since 2023. T7 compression fracture, age indeterminate, 21% loss of vertebral body height. Mild T4 and T5 superior endplate compression fractures, age indeterminate, with a lesser degree of height loss. L1 inferior endplate compression, likely chronic, not significantly changed since 2023. No retropulsion in the thoracic spine. Thoracic posterior elements appear intact and aligned. Visible posterior ribs appear intact. SOFT TISSUES: Negative thoracic paraspinal soft tissues. Major airways are patent. Mild respiratory motion. Mild lung base atelectasis superimposed on moderate to large gastric hiatal hernia, with roughly 50% intrathoracic stomach. Heart size appears normal. No evidence of pericardial effusion, pleural effusion, or mediastinal lymphadenopathy. Calcified aortic atherosclerosis. Negative other visible non-contrast upper abdominal viscera. DEGENERATIVE CHANGES: Generally  mild thoracic spine degeneration and capacious CT appearance of the thoracic spinal canal. No significant thoracic spinal stenosis by CT. IMPRESSION: 1. Age-indeterminate thoracic compression fractures at T7, T8, and T9. If specific therapy such as vertebroplasty is desired, MRI without contrast or a nuclear medicine whole-body bone scan would best determine acuity / candidacy for intervention. 2. Chronic compression fractures at T6, T10, T11, and L1. 3. Mild for age thoracic degeneration, no CT evidence of spinal stenosis. 4. Moderate to large gastric hiatal hernia; approximately 50% intrathoracic stomach. Electronically signed by: Helayne Hurst MD 03/18/2024 09:12 AM EST RP Workstation: [id number]   CT Cervical Spine Wo Contrast Result Date: 03/18/2024 EXAM: CT CERVICAL SPINE WITHOUT CONTRAST 03/18/2024 08:56:10 AM TECHNIQUE: CT of the cervical spine was performed without the administration of intravenous contrast. Multiplanar reformatted images are provided for review. Automated exposure control, iterative reconstruction, and/or weight based adjustment of the mA/kV was utilized to reduce the radiation dose to as low as reasonably achievable. COMPARISON: None available. CLINICAL HISTORY: 83 year old female with acute neck pain and known malignancy. History of osteoarthritis and osteoporosis. No known injury. FINDINGS: BONES AND ALIGNMENT: Normal cervical lordosis. Congenital incomplete ossification of the posterior C1 ring, normal variant. Bone mineralization in the visible spine within normal  limits for age. No acute fracture or traumatic malalignment. Extensive dental streak artifact. DEGENERATIVE CHANGES: Very mild for age cervical spine degeneration. Capacious CT appearance of the spinal canal. No evidence of spinal stenosis. SOFT TISSUES: No prevertebral soft tissue swelling. Calcified carotid bifurcation atherosclerosis in the neck. Otherwise negative visible non-contrast neck soft tissues. Negative  visible non-contrast thoracic inlet. LIMITED HEAD: Incidental occipital arachnoid granulations in the midline of the posterior fossa (series 4 image 25 and sagittal image 32), normal variation. Negative visible non-contrast cervicomedullary junction and posterior fossa brain parenchyma. Visible paranasal sinuses, tympanic cavities and mastoids are clear. IMPRESSION: 1. No acute osseous abnormality in the cervical spine. 2. Mild for age cervical spine degeneration, no spinal stenosis by CT. 3. Calcified carotid atherosclerosis. Electronically signed by: Helayne Hurst MD 03/18/2024 09:03 AM EST RP Workstation: [id number]     Procedures   Medications Ordered in the ED  lidocaine  (LIDODERM ) 5 % 1 patch (1 patch Transdermal Patch Applied 03/18/24 0831)  acetaminophen  (TYLENOL ) tablet 1,000 mg (1,000 mg Oral Given 03/18/24 9167)                                    Medical Decision Making Amount and/or Complexity of Data Reviewed Radiology: ordered.  Risk OTC drugs. Prescription drug management.   This patient presents to the ED for concern of back pain, this involves an extensive number of treatment options, and is a complaint that carries with it a high risk of complications and morbidity.  The differential diagnosis includes pyelonephritis, nephrolithiasis, spinal abscess, osteomyelitis, herniated disc, muscle strain, spinal fracture, meningitis, cancer, cauda equina syndrome.   Co morbidities that complicate the patient evaluation   HLD, OA    Additional history obtained:  No PCP listed in chart    Problem List / ED Course / Critical interventions / Medication management  Patient presents ED concern for neck pain that started this morning upon waking up.  Patient did exert herself yesterday while cleaning up a fallen limb in her yard which may have contributed to symptoms.  Physical exam with tenderness to palpation of left upper trapezius muscle.  Rest of physical exam reassuring.   Patient afebrile with stable vitals. I ordered imaging studies including CT thoracic/cervical spine. I independently visualized and interpreted imaging which showed age-indeterminate compression fractures of thoracic spine -these compression fractures are not consistent with patient's pain complaint today so I doubt this is the cause of her pain. I agree with the radiologist interpretation. All results with patient.  Answered all questions.  Provided patient with Tylenol , ice, and lidocaine  which has helped resolving her symptoms.  It appears the patient's symptoms are MSK related.  Recommended following up with PCP.  Patient agreeable to plan. Staffed with Dr. Doretha. I have reviewed the patients home medicines and have made adjustments as needed The patient has been appropriately medically screened and/or stabilized in the ED. I have low suspicion for any other emergent medical condition which would require further screening, evaluation or treatment in the ED or require inpatient management. At time of discharge the patient is hemodynamically stable and in no acute distress. I have discussed work-up results and diagnosis with patient and answered all questions. Patient is agreeable with discharge plan. We discussed strict return precautions for returning to the emergency department and they verbalized understanding.     Social Determinants of Health:  geriatric      Final diagnoses:  Muscle strain    ED Discharge Orders          Ordered    lidocaine  (XYLOCAINE ) 5 % ointment  4 times daily PRN        03/18/24 1017               Hoy Nidia FALCON, [place] 03/18/24 1019    Doretha Folks, MD 03/18/24 1504

## 2024-03-18 NOTE — Discharge Instructions (Addendum)
 As discussed, please follow-up with primary care for non-emergent follow up for findings on CT scan.  Seek emergency care experiencing any new or worsening symptoms.  Alternating between 650 mg Tylenol  and 400 mg Advil: The best way to alternate taking Acetaminophen  (example Tylenol ) and Ibuprofen (example Advil/Motrin) is to take them 3 hours apart. For example, if you take ibuprofen at 6 am you can then take Tylenol  at 9 am. You can continue this regimen throughout the day, making sure you do not exceed the recommended maximum dose for each drug.  CT Thoracic Spine Findings: IMPRESSION: 1. Age-indeterminate thoracic compression fractures at T7, T8, and T9. If specific therapy such as vertebroplasty is desired, MRI without contrast or a nuclear medicine whole-body bone scan would best determine acuity / candidacy for intervention. 2. Chronic compression fractures at T6, T10, T11, and L1. 3. Mild for age thoracic degeneration, no CT evidence of spinal stenosis. 4. Moderate to large gastric hiatal hernia; approximately 50% intrathoracic stomach.

## 2024-03-18 NOTE — ED Triage Notes (Addendum)
 Woke up with upper back and neck pain on Thursday morning, pain gets better in the afternoon and then flares back up every morning. Denies injury.

## 2024-04-01 ENCOUNTER — Telehealth: Payer: Self-pay | Admitting: Pharmacist

## 2024-04-01 ENCOUNTER — Other Ambulatory Visit: Payer: Self-pay

## 2024-04-01 ENCOUNTER — Other Ambulatory Visit (HOSPITAL_COMMUNITY): Payer: Self-pay

## 2024-04-01 DIAGNOSIS — M810 Age-related osteoporosis without current pathological fracture: Secondary | ICD-10-CM

## 2024-04-01 MED ORDER — DENOSUMAB 60 MG/ML ~~LOC~~ SOSY
60.0000 mg | PREFILLED_SYRINGE | SUBCUTANEOUS | 0 refills | Status: AC
  Filled 2024-04-01 – 2024-04-08 (×2): qty 1, 180d supply, fill #0

## 2024-04-01 NOTE — Telephone Encounter (Signed)
 Patient due for Prolia  on 04/20/2024. Per test claim, copay is $64.  CMP on 12/12/205 showed calcium wnl. Rx for Prolia  sent to [facility] to be couriered to clinic  Patient can be scheduled for Prolia  appt on or after 04/20/24  Errin Whitelaw, PharmD, MPH, BCPS, CPP Clinical Pharmacist

## 2024-04-03 NOTE — Telephone Encounter (Signed)
 Prolia  scheduled for 04/24/2024 at 2:00 pm.

## 2024-04-08 ENCOUNTER — Other Ambulatory Visit (HOSPITAL_COMMUNITY): Payer: Self-pay

## 2024-04-08 ENCOUNTER — Other Ambulatory Visit: Payer: Self-pay

## 2024-04-10 ENCOUNTER — Other Ambulatory Visit: Payer: Self-pay

## 2024-04-15 ENCOUNTER — Other Ambulatory Visit: Payer: Self-pay

## 2024-04-24 ENCOUNTER — Ambulatory Visit

## 2024-04-24 ENCOUNTER — Other Ambulatory Visit (HOSPITAL_COMMUNITY): Payer: Self-pay

## 2024-05-06 ENCOUNTER — Ambulatory Visit: Admitting: Family Medicine

## 2024-06-10 ENCOUNTER — Ambulatory Visit: Admitting: Family Medicine

## 2024-09-17 ENCOUNTER — Ambulatory Visit: Admitting: Rheumatology
